# Patient Record
Sex: Male | Born: 1955 | Race: Black or African American | Hispanic: No | Marital: Married | Smoking: Never smoker
Health system: Southern US, Community
[De-identification: ages and names within clinical notes are randomized; demographics above are authoritative.]

## PROBLEM LIST (undated history)

## (undated) DIAGNOSIS — I639 Cerebral infarction, unspecified: Secondary | ICD-10-CM

## (undated) DIAGNOSIS — I10 Essential (primary) hypertension: Secondary | ICD-10-CM

## (undated) HISTORY — PX: NO PAST SURGERIES: SHX2092

---

## 2005-08-22 ENCOUNTER — Encounter: Admission: RE | Admit: 2005-08-22 | Discharge: 2005-08-22 | Payer: Self-pay | Admitting: Internal Medicine

## 2014-11-23 ENCOUNTER — Emergency Department (HOSPITAL_COMMUNITY): Payer: BLUE CROSS/BLUE SHIELD

## 2014-11-23 ENCOUNTER — Inpatient Hospital Stay (HOSPITAL_COMMUNITY)
Admission: EM | Admit: 2014-11-23 | Discharge: 2014-11-24 | DRG: 066 | Disposition: A | Discharge status: Home / Self Care | Payer: BLUE CROSS/BLUE SHIELD | Attending: Internal Medicine | Admitting: Internal Medicine

## 2014-11-23 ENCOUNTER — Encounter (HOSPITAL_COMMUNITY): Payer: Self-pay | Admitting: *Deleted

## 2014-11-23 DIAGNOSIS — I639 Cerebral infarction, unspecified: Principal | ICD-10-CM

## 2014-11-23 DIAGNOSIS — E785 Hyperlipidemia, unspecified: Secondary | ICD-10-CM | POA: Insufficient documentation

## 2014-11-23 DIAGNOSIS — R471 Dysarthria and anarthria: Secondary | ICD-10-CM | POA: Diagnosis present

## 2014-11-23 DIAGNOSIS — I10 Essential (primary) hypertension: Secondary | ICD-10-CM | POA: Diagnosis present

## 2014-11-23 DIAGNOSIS — Z8249 Family history of ischemic heart disease and other diseases of the circulatory system: Secondary | ICD-10-CM

## 2014-11-23 DIAGNOSIS — R29810 Facial weakness: Secondary | ICD-10-CM | POA: Diagnosis present

## 2014-11-23 DIAGNOSIS — I6789 Other cerebrovascular disease: Secondary | ICD-10-CM | POA: Diagnosis not present

## 2014-11-23 DIAGNOSIS — G4733 Obstructive sleep apnea (adult) (pediatric): Secondary | ICD-10-CM | POA: Diagnosis present

## 2014-11-23 DIAGNOSIS — E876 Hypokalemia: Secondary | ICD-10-CM | POA: Diagnosis present

## 2014-11-23 DIAGNOSIS — Z6829 Body mass index (BMI) 29.0-29.9, adult: Secondary | ICD-10-CM

## 2014-11-23 DIAGNOSIS — R4189 Other symptoms and signs involving cognitive functions and awareness: Secondary | ICD-10-CM | POA: Diagnosis present

## 2014-11-23 DIAGNOSIS — Z79899 Other long term (current) drug therapy: Secondary | ICD-10-CM | POA: Diagnosis not present

## 2014-11-23 DIAGNOSIS — E669 Obesity, unspecified: Secondary | ICD-10-CM | POA: Diagnosis present

## 2014-11-23 HISTORY — DX: Essential (primary) hypertension: I10

## 2014-11-23 LAB — PROTIME-INR
INR: 0.97 (ref 0.00–1.49)
PROTHROMBIN TIME: 13.1 s (ref 11.6–15.2)

## 2014-11-23 LAB — DIFFERENTIAL
Basophils Absolute: 0 10*3/uL (ref 0.0–0.1)
Basophils Relative: 0 % (ref 0–1)
EOS PCT: 1 % (ref 0–5)
Eosinophils Absolute: 0.1 10*3/uL (ref 0.0–0.7)
LYMPHS PCT: 17 % (ref 12–46)
Lymphs Abs: 1.5 10*3/uL (ref 0.7–4.0)
MONOS PCT: 7 % (ref 3–12)
Monocytes Absolute: 0.6 10*3/uL (ref 0.1–1.0)
NEUTROS PCT: 75 % (ref 43–77)
Neutro Abs: 6.7 10*3/uL (ref 1.7–7.7)

## 2014-11-23 LAB — COMPREHENSIVE METABOLIC PANEL
ALBUMIN: 4.5 g/dL (ref 3.5–5.0)
ALK PHOS: 50 U/L (ref 38–126)
ALT: 19 U/L (ref 17–63)
ANION GAP: 7 (ref 5–15)
AST: 22 U/L (ref 15–41)
BILIRUBIN TOTAL: 1 mg/dL (ref 0.3–1.2)
BUN: 12 mg/dL (ref 6–20)
CALCIUM: 9.6 mg/dL (ref 8.9–10.3)
CHLORIDE: 103 mmol/L (ref 101–111)
CO2: 28 mmol/L (ref 22–32)
CREATININE: 0.99 mg/dL (ref 0.61–1.24)
GFR calc non Af Amer: >60 mL/min (ref >60)
Glucose, Bld: 114 mg/dL — ABNORMAL HIGH (ref 65–99)
Potassium: 3.2 mmol/L — ABNORMAL LOW (ref 3.5–5.1)
SODIUM: 138 mmol/L (ref 135–145)
TOTAL PROTEIN: 8.1 g/dL (ref 6.5–8.1)

## 2014-11-23 LAB — I-STAT CHEM 8, ED
BUN: 11 mg/dL (ref 6–20)
CALCIUM ION: 1.22 mmol/L (ref 1.12–1.23)
Chloride: 102 mmol/L (ref 101–111)
Creatinine, Ser: 1 mg/dL (ref 0.61–1.24)
GLUCOSE: 109 mg/dL — AB (ref 65–99)
HCT: 47 % (ref 39.0–52.0)
Hemoglobin: 16 g/dL (ref 13.0–17.0)
POTASSIUM: 3.3 mmol/L — AB (ref 3.5–5.1)
Sodium: 140 mmol/L (ref 135–145)
TCO2: 25 mmol/L (ref 0–100)

## 2014-11-23 LAB — CBC
HCT: 43.3 % (ref 39.0–52.0)
HEMOGLOBIN: 14.5 g/dL (ref 13.0–17.0)
MCH: 33.6 pg (ref 26.0–34.0)
MCHC: 33.5 g/dL (ref 30.0–36.0)
MCV: 100.5 fL — ABNORMAL HIGH (ref 78.0–100.0)
PLATELETS: 272 10*3/uL (ref 150–400)
RBC: 4.31 MIL/uL (ref 4.22–5.81)
RDW: 12.2 % (ref 11.5–15.5)
WBC: 8.8 10*3/uL (ref 4.0–10.5)

## 2014-11-23 LAB — I-STAT TROPONIN, ED: TROPONIN I, POC: 0 ng/mL (ref 0.00–0.08)

## 2014-11-23 LAB — APTT: aPTT: 28 seconds (ref 24–37)

## 2014-11-23 MED ORDER — POTASSIUM CHLORIDE CRYS ER 20 MEQ PO TBCR
20.0000 meq | EXTENDED_RELEASE_TABLET | Freq: Once | ORAL | Status: AC
  Administered 2014-11-24: 20 meq via ORAL
  Filled 2014-11-23: qty 1

## 2014-11-23 NOTE — ED Notes (Signed)
Pt complains of left sided facial droop, slurred speech and difficulty finding words since he awoke today at [facility]. Pt states his face was normal when he went to bed today at 3PM. Pt also complains of right arm numbness since Sunday morning.

## 2014-11-23 NOTE — ED Notes (Signed)
Dr. Wentz in room  ?

## 2014-11-23 NOTE — ED Notes (Signed)
Wife states pt woke up Sunday and was c/o numbness in his right arm  States on Tuesday he said a coworker told him he was talking funny and today when he got up about 6pm his speech was slurred and he had some facial droop on the right side

## 2014-11-23 NOTE — H&P (Signed)
[facility] History and Physical  Phillip Curry SAY:[id number] DOB: [birth year] DOA: 11/23/2014  Referring physician: Dr. Eulis Foster. PCP: No primary care provider on file. Dr. Mertha Finders. Specialists: None.  Chief Complaint: Right-sided facial droop and right-sided numbness.  HPI: Phillip Curry Current is a 59 y.o. male with history of hypertension who was brought to the ER to patient's wife noticed patient having right facial droop. Patient states he was experiencing right upper extremity numbness for the last 3 days. 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 This evening around 6 PM his wife noticed right facial droop and patient was brought to the ER. MRI of the brain shows acute nonhemorrhagic stroke. On-call neurologist Dr. Doy Mince was consulted and patient has been admitted for further stroke management. Patient was out of the window period for TPA. Patient on exam is able to move all extremities and has right facial droop and slurred speech. Denies any chest pain shortness of breath headache visual symptoms difficulty swallowing or any visual symptoms.   Review of Systems: As presented in the history of presenting illness, rest negative.  Past Medical History  Diagnosis Date  . Hypertension    Past Surgical History  Procedure Laterality Date  . No past surgeries     Social History:  reports that he has never smoked. He does not have any smokeless tobacco history on file. He reports that he drinks alcohol. His drug history is not on file. Where does patient live at home. Can patient participate in ADLs? Yes.  No Known Allergies  Family History:  Family History  Problem Relation Age of Onset  . Hypertension Mother       Prior to Admission medications   Medication Sig Start Date End Date Taking? Authorizing Provider  Multiple Vitamin (MULTIVITAMIN WITH MINERALS) TABS tablet Take 1 tablet by mouth daily.   Yes Historical Provider, MD   valsartan-hydrochlorothiazide (DIOVAN-HCT) 160-12.5 MG per tablet Take 1 tablet by mouth daily. 11/09/14  Yes Historical Provider, MD    Physical Exam: Filed Vitals:   11/23/14 2153 11/23/14 2200 11/23/14 2230 11/23/14 2300  BP: 154/91 149/88 152/80 153/84  Pulse: 67 68 67 66  Temp: 98.1 F (36.7 C)     TempSrc: Oral     Resp: 17 18 22 13   SpO2: 97% 96% 95% 97%     General:  Moderately built and nourished.  Eyes: Anicteric no pallor.  ENT: No discharge from the ears eyes nose and mouth.  Neck: No mass felt. No neck rigidity.  Cardiovascular: S1-S2 heard.  Respiratory: No rhonchi or crepitations.  Abdomen: Soft nontender bowel sounds present.  Skin: No rash.  Musculoskeletal: No edema.  Psychiatric: Appears normal.  Neurologic: Alert awake oriented to time place and person. Moves all extremities 5 x 5. Right facial droop. Tongue is midline. PERRLA positive.  Labs on Admission:  Basic Metabolic Panel:  Recent Labs Lab 11/23/14 2032 11/23/14 2042  NA 138 140  K 3.2* 3.3*  CL 103 102  CO2 28  --   GLUCOSE 114* 109*  BUN 12 11  CREATININE 0.99 1.00  CALCIUM 9.6  --    Liver Function Tests:  Recent Labs Lab 11/23/14 2032  AST 22  ALT 19  ALKPHOS 50  BILITOT 1.0  PROT 8.1  ALBUMIN 4.5   No results for input(s): LIPASE, AMYLASE in the last 168 hours. No results for input(s): AMMONIA in the last 168 hours. CBC:  Recent Labs Lab 11/23/14 2032  11/23/14 2042  WBC 8.8  --   NEUTROABS 6.7  --   HGB 14.5 16.0  HCT 43.3 47.0  MCV 100.5*  --   PLT 272  --    Cardiac Enzymes: No results for input(s): CKTOTAL, CKMB, CKMBINDEX, TROPONINI in the last 168 hours.  BNP (last 3 results) No results for input(s): BNP in the last 8760 hours.  ProBNP (last 3 results) No results for input(s): PROBNP in the last 8760 hours.  CBG: No results for input(s): GLUCAP in the last 168 hours.  Radiological Exams on Admission: Ct Head Wo Contrast  11/23/2014    CLINICAL DATA:  Left-sided facial droop and slurred speech.  EXAM: CT HEAD WITHOUT CONTRAST  TECHNIQUE: Contiguous axial images were obtained from the base of the skull through the vertex without intravenous contrast.  COMPARISON:  None.  FINDINGS: There is patchy low attenuation within the left basal ganglia. No evidence for acute intracranial hemorrhage or significant mass effect. Ventricles and sulci are appropriate for patient's age. Orbits are unremarkable. Paranasal sinuses are unremarkable. Calvarium is intact.  IMPRESSION: Findings compatible with acute infarct within left basal ganglia region. No intracranial hemorrhage.  These results were called by telephone at the time of interpretation on 11/23/2014 at 9:19 pm to Dr. Daleen Bo , who verbally acknowledged these results.   Electronically Signed   By: Lovey Newcomer M.D.   On: 11/23/2014 21:21   [name] Head Wo Contrast  11/23/2014   CLINICAL DATA:  Left-sided facial droop. Slurred speech difficulty finding words. Right arm numbness for 3 days.  EXAM: MRI HEAD WITHOUT CONTRAST  MRA HEAD WITHOUT CONTRAST  TECHNIQUE: Multiplanar, multiecho pulse sequences of the brain and surrounding structures were obtained without intravenous contrast. Angiographic images of the head were obtained using MRA technique without contrast.  COMPARISON:  CT head from the same day.  FINDINGS: MRI HEAD FINDINGS  The diffusion-weighted images confirm acute nonhemorrhagic infarcts involving the posterior left lentiform nucleus and left centrum semi ovale. No acute hemorrhage or mass lesion is present. T2 changes are associated with the acute infarct in the posterior left lentiform nucleus. There is a remote lacunar infarct in the left thalamus. Scattered subcortical T2 changes bilaterally are advanced for age.  Flow is present in the major intracranial arteries. The globes and orbits are intact. Mild mucosal thickening is present in the ethmoid air cells and left frontal sinus. The  remaining paranasal sinuses and the mastoid air cells are clear.  Skullbase is within normal limits. Midline structures are otherwise unremarkable.  MRA HEAD FINDINGS  The internal carotid arteries are within normal limits from the high cervical segments through the ICA terminus bilaterally. The left A1 segment is hypoplastic. There is asymmetric attenuation of anterior left MCA branch vessels. The more posterior branch vessels are intact. Right MCA branch vessels are intact. The anterior communicating artery is patent.  The left vertebral artery is slightly dominant to the right. The PICA origins are visualized and normal. The basilar artery is normal. Both posterior cerebral arteries originate from the basilar tip. The PCA branch vessels are intact.  IMPRESSION: 1. 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 2. Age advanced subcortical white matter changes bilaterally. These likely reflect the sequela of chronic microvascular ischemia. 3. Asymmetric attenuation of anterior left in 3 branch vessels. No significant proximal stenosis or occlusion is present. These results were called by telephone at the time of interpretation on 11/23/2014 at 9:41 pm  to Dr. Daleen Bo , who verbally acknowledged these results.   Electronically Signed   By: San Morelle M.D.   On: 11/23/2014 21:41   Mr Brain Wo Contrast  11/23/2014   CLINICAL DATA:  Left-sided facial droop. Slurred speech difficulty finding words. Right arm numbness for 3 days.  EXAM: MRI HEAD WITHOUT CONTRAST  MRA HEAD WITHOUT CONTRAST  TECHNIQUE: Multiplanar, multiecho pulse sequences of the brain and surrounding structures were obtained without intravenous contrast. Angiographic images of the head were obtained using MRA technique without contrast.  COMPARISON:  CT head from the same day.  FINDINGS: MRI HEAD FINDINGS  The diffusion-weighted images confirm acute nonhemorrhagic infarcts involving the  posterior left lentiform nucleus and left centrum semi ovale. No acute hemorrhage or mass lesion is present. T2 changes are associated with the acute infarct in the posterior left lentiform nucleus. There is a remote lacunar infarct in the left thalamus. Scattered subcortical T2 changes bilaterally are advanced for age.  Flow is present in the major intracranial arteries. The globes and orbits are intact. Mild mucosal thickening is present in the ethmoid air cells and left frontal sinus. The remaining paranasal sinuses and the mastoid air cells are clear.  Skullbase is within normal limits. Midline structures are otherwise unremarkable.  MRA HEAD FINDINGS  The internal carotid arteries are within normal limits from the high cervical segments through the ICA terminus bilaterally. The left A1 segment is hypoplastic. There is asymmetric attenuation of anterior left MCA branch vessels. The more posterior branch vessels are intact. Right MCA branch vessels are intact. The anterior communicating artery is patent.  The left vertebral artery is slightly dominant to the right. The PICA origins are visualized and normal. The basilar artery is normal. Both posterior cerebral arteries originate from the basilar tip. The PCA branch vessels are intact.  IMPRESSION: 1. 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 2. Age advanced subcortical white matter changes bilaterally. These likely reflect the sequela of chronic microvascular ischemia. 3. Asymmetric attenuation of anterior left in 3 branch vessels. No significant proximal stenosis or occlusion is present. These results were called by telephone at the time of interpretation on 11/23/2014 at 9:41 pm to Dr. Daleen Bo , who verbally acknowledged these results.   Electronically Signed   By: San Morelle M.D.   On: 11/23/2014 21:41    EKG: Independently reviewed. Normal sinus rhythm.  Assessment/Plan Principal  Problem:   Stroke Active Problems:   Hypertension   1. Stroke - patient has passed stroke swallow. Patient will be placed on neuro checks. Get 2-D echo and carotid Doppler. Aspirin. Posterior monitor in telemetry. Neurology to see patient in consult. 2. Hypertension - continue Diovan. Hold HCTZ and patient will be placed on gentle hydration. Allow for permissive hypertension. 3. Mild hypokalemia - probably from HCTZ. Replace and recheck.  Chest x-ray is pending. I have personally reviewed the patient's EKG.  Patient will be transferred to [facility]. Patient is agreeable to transfer. Dr. Posey Pronto will be the accepting physician.   DVT Prophylaxis Lovenox.  Code Status: Full code.  Family Communication: Discussed with patient's family.  Disposition Plan: Admit to inpatient.    Wynter Grave N. [facility] Pager [phone].  If 7PM-7AM, please contact night-coverage [email or web address] Password [email or web address] 11/23/2014, 11:29 PM

## 2014-11-23 NOTE — ED Notes (Signed)
Patient transported to MRI 

## 2014-11-23 NOTE — ED Provider Notes (Signed)
CSN: [id number]     Arrival date & time 11/23/14  2000 History   First MD Initiated Contact with Patient 11/23/14 2035     Chief Complaint  Patient presents with  . Facial Droop  . Numbness     (Consider location/radiation/quality/duration/timing/severity/associated sxs/prior Treatment) HPI   Calel Pisarski is a 59 y.o. male who presents for evaluation of slurred speech, facial asymmetry, and tingling in his right arm. Arm tingling and slurred speech had been present for 3 days, then his wife noticed facial asymmetry, today. He was able to work the last several days. He denies dizziness, weakness, nausea, vomiting, headache, chest pain or back pain. He's never had this previously. He has no other medical concerns or problems. There are no other known modifying factors.   History reviewed. No pertinent past medical history. History reviewed. No pertinent past surgical history. No family history on file. History  Substance Use Topics  . Smoking status: Never Smoker   . Smokeless tobacco: Not on file  . Alcohol Use: Yes    Review of Systems  All other systems reviewed and are negative.     Allergies  Review of patient's allergies indicates no known allergies.  Home Medications   Prior to Admission medications   Medication Sig Start Date End Date Taking? Authorizing Provider  Multiple Vitamin (MULTIVITAMIN WITH MINERALS) TABS tablet Take 1 tablet by mouth daily.   Yes Historical Provider, MD  valsartan-hydrochlorothiazide (DIOVAN-HCT) 160-12.5 MG per tablet Take 1 tablet by mouth daily. 11/09/14  Yes Historical Provider, MD   BP 154/91 mmHg  Pulse 67  Temp(Src) 98.1 F (36.7 C) (Oral)  Resp 17  SpO2 97% Physical Exam  Constitutional: He is oriented to person, place, and time. He appears well-developed and well-nourished.  HENT:  Head: Normocephalic and atraumatic.  Right Ear: External ear normal.  Left Ear: External ear normal.  Eyes: Conjunctivae and EOM are normal.  Pupils are equal, round, and reactive to light.  Neck: Normal range of motion and phonation normal. Neck supple.  Cardiovascular: Normal rate, regular rhythm and normal heart sounds.   Pulmonary/Chest: Effort normal and breath sounds normal. He exhibits no bony tenderness.  Abdominal: Soft. There is no tenderness.  Musculoskeletal: Normal range of motion.  Neurological: He is alert and oriented to person, place, and time. No cranial nerve deficit or sensory deficit. He exhibits normal muscle tone. Coordination normal.  Mild dysarthria. No aphasia or nystagmus. Normal finger-to-nose and heel-to-shin, bilaterally. Mild facial asymmetry, right-sided droop.  Skin: Skin is warm, dry and intact.  Psychiatric: He has a normal mood and affect. His behavior is normal. Judgment and thought content normal.  Nursing note and vitals reviewed.   ED Course  Procedures (including critical care time)  Medications - No data to display  Patient Vitals for the past 24 hrs:  BP Temp Temp src Pulse Resp SpO2  11/23/14 2153 154/91 mmHg 98.1 F (36.7 C) Oral 67 17 97 %  11/23/14 2048 - 98 F (36.7 C) - - - -  11/23/14 2045 171/96 mmHg 98 F (36.7 C) Oral 78 23 100 %  11/23/14 2007 154/84 mmHg 98.6 F (37 C) Oral 79 18 (!) 83 %   Findings discussed with on-call neuro hospitalist,- . She will see the patient in consultation at [place]  10:01 PM-Consult complete with Hospitalist. Patient case explained and discussed. He agrees to admit patient for further evaluation and treatment. Call ended at 2215  10:25 PM Reevaluation with update and  discussion. After initial assessment and treatment, an updated evaluation reveals no change in clinical status.Daleen Bo L    Labs Review Labs Reviewed  CBC - Abnormal; Notable for the following:    MCV 100.5 (*)    All other components within normal limits  COMPREHENSIVE METABOLIC PANEL - Abnormal; Notable for the following:    Potassium 3.2 (*)     Glucose, Bld 114 (*)    All other components within normal limits  I-STAT CHEM 8, ED - Abnormal; Notable for the following:    Potassium 3.3 (*)    Glucose, Bld 109 (*)    All other components within normal limits  PROTIME-INR  APTT  DIFFERENTIAL  I-STAT TROPOININ, ED  CBG MONITORING, ED    Imaging Review Ct Head Wo Contrast  11/23/2014   CLINICAL DATA:  Left-sided facial droop and slurred speech.  EXAM: CT HEAD WITHOUT CONTRAST  TECHNIQUE: Contiguous axial images were obtained from the base of the skull through the vertex without intravenous contrast.  COMPARISON:  None.  FINDINGS: There is patchy low attenuation within the left basal ganglia. No evidence for acute intracranial hemorrhage or significant mass effect. Ventricles and sulci are appropriate for patient's age. Orbits are unremarkable. Paranasal sinuses are unremarkable. Calvarium is intact.  IMPRESSION: Findings compatible with acute infarct within left basal ganglia region. No intracranial hemorrhage.  These results were called by telephone at the time of interpretation on 11/23/2014 at 9:19 pm to Dr. Daleen Bo , who verbally acknowledged these results.   Electronically Signed   By: Lovey Newcomer M.D.   On: 11/23/2014 21:21   [name] Head Wo Contrast  11/23/2014   CLINICAL DATA:  Left-sided facial droop. Slurred speech difficulty finding words. Right arm numbness for 3 days.  EXAM: MRI HEAD WITHOUT CONTRAST  MRA HEAD WITHOUT CONTRAST  TECHNIQUE: Multiplanar, multiecho pulse sequences of the brain and surrounding structures were obtained without intravenous contrast. Angiographic images of the head were obtained using MRA technique without contrast.  COMPARISON:  CT head from the same day.  FINDINGS: MRI HEAD FINDINGS  The diffusion-weighted images confirm acute nonhemorrhagic infarcts involving the posterior left lentiform nucleus and left centrum semi ovale. No acute hemorrhage or mass lesion is present. T2 changes are associated with  the acute infarct in the posterior left lentiform nucleus. There is a remote lacunar infarct in the left thalamus. Scattered subcortical T2 changes bilaterally are advanced for age.  Flow is present in the major intracranial arteries. The globes and orbits are intact. Mild mucosal thickening is present in the ethmoid air cells and left frontal sinus. The remaining paranasal sinuses and the mastoid air cells are clear.  Skullbase is within normal limits. Midline structures are otherwise unremarkable.  MRA HEAD FINDINGS  The internal carotid arteries are within normal limits from the high cervical segments through the ICA terminus bilaterally. The left A1 segment is hypoplastic. There is asymmetric attenuation of anterior left MCA branch vessels. The more posterior branch vessels are intact. Right MCA branch vessels are intact. The anterior communicating artery is patent.  The left vertebral artery is slightly dominant to the right. The PICA origins are visualized and normal. The basilar artery is normal. Both posterior cerebral arteries originate from the basilar tip. The PCA branch vessels are intact.  IMPRESSION: 1. 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 2. Age advanced subcortical white matter changes bilaterally. These likely reflect the sequela of chronic microvascular ischemia. 3.  Asymmetric attenuation of anterior left in 3 branch vessels. No significant proximal stenosis or occlusion is present. These results were called by telephone at the time of interpretation on 11/23/2014 at 9:41 pm to Dr. Daleen Bo , who verbally acknowledged these results.   Electronically Signed   By: San Morelle M.D.   On: 11/23/2014 21:41   Mr Brain Wo Contrast  11/23/2014   CLINICAL DATA:  Left-sided facial droop. Slurred speech difficulty finding words. Right arm numbness for 3 days.  EXAM: MRI HEAD WITHOUT CONTRAST  MRA HEAD WITHOUT CONTRAST  TECHNIQUE:  Multiplanar, multiecho pulse sequences of the brain and surrounding structures were obtained without intravenous contrast. Angiographic images of the head were obtained using MRA technique without contrast.  COMPARISON:  CT head from the same day.  FINDINGS: MRI HEAD FINDINGS  The diffusion-weighted images confirm acute nonhemorrhagic infarcts involving the posterior left lentiform nucleus and left centrum semi ovale. No acute hemorrhage or mass lesion is present. T2 changes are associated with the acute infarct in the posterior left lentiform nucleus. There is a remote lacunar infarct in the left thalamus. Scattered subcortical T2 changes bilaterally are advanced for age.  Flow is present in the major intracranial arteries. The globes and orbits are intact. Mild mucosal thickening is present in the ethmoid air cells and left frontal sinus. The remaining paranasal sinuses and the mastoid air cells are clear.  Skullbase is within normal limits. Midline structures are otherwise unremarkable.  MRA HEAD FINDINGS  The internal carotid arteries are within normal limits from the high cervical segments through the ICA terminus bilaterally. The left A1 segment is hypoplastic. There is asymmetric attenuation of anterior left MCA branch vessels. The more posterior branch vessels are intact. Right MCA branch vessels are intact. The anterior communicating artery is patent.  The left vertebral artery is slightly dominant to the right. The PICA origins are visualized and normal. The basilar artery is normal. Both posterior cerebral arteries originate from the basilar tip. The PCA branch vessels are intact.  IMPRESSION: 1. 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 2. Age advanced subcortical white matter changes bilaterally. These likely reflect the sequela of chronic microvascular ischemia. 3. Asymmetric attenuation of anterior left in 3 branch vessels. No significant  proximal stenosis or occlusion is present. These results were called by telephone at the time of interpretation on 11/23/2014 at 9:41 pm to Dr. Daleen Bo , who verbally acknowledged these results.   Electronically Signed   By: San Morelle M.D.   On: 11/23/2014 21:41     EKG Interpretation   Date/Time:  Wednesday November 23 2014 20:07:04 EDT Ventricular Rate:  77 PR Interval:  157 QRS Duration: 94 QT Interval:  393 QTC Calculation: 445 R Axis:   75 Text Interpretation:  Sinus rhythm RSR' in V1 or V2, right VCD or RVH No  old tracing to compare Confirmed by [facility]  MD, Syon Tews [phone]) on 11/23/2014  9:55:00 PM      MDM   Final diagnoses:  CVA (cerebral infarction)     Subacute CVA, not meeting criteria for thrombolysis. Symptoms are relatively mild, present for 3 days. Possible worsening today with newly noticed facial droop. This is likely secondary to edema. Doubt ACS, metabolic instability or impending vascular collapse.  Nursing Notes Reviewed/ Care Coordinated Applicable Imaging Reviewed Interpretation of Laboratory Data incorporated into ED treatment  Plan: admit   Daleen Bo, MD 11/23/14 2232

## 2014-11-23 NOTE — ED Notes (Signed)
Patient transported to CT 

## 2014-11-23 NOTE — ED Notes (Signed)
Introduced self to pt and significant other, pt a&ox4, denies pain or further need. Aware waiting for [facility] for transfer to [facility].

## 2014-11-24 ENCOUNTER — Ambulatory Visit (HOSPITAL_COMMUNITY): Payer: BLUE CROSS/BLUE SHIELD

## 2014-11-24 ENCOUNTER — Inpatient Hospital Stay (HOSPITAL_COMMUNITY): Payer: BLUE CROSS/BLUE SHIELD

## 2014-11-24 ENCOUNTER — Other Ambulatory Visit: Payer: Self-pay | Admitting: Neurology

## 2014-11-24 DIAGNOSIS — I639 Cerebral infarction, unspecified: Principal | ICD-10-CM

## 2014-11-24 DIAGNOSIS — I63312 Cerebral infarction due to thrombosis of left middle cerebral artery: Secondary | ICD-10-CM

## 2014-11-24 DIAGNOSIS — I6789 Other cerebrovascular disease: Secondary | ICD-10-CM

## 2014-11-24 DIAGNOSIS — E785 Hyperlipidemia, unspecified: Secondary | ICD-10-CM | POA: Insufficient documentation

## 2014-11-24 LAB — LIPID PANEL
Cholesterol: 218 mg/dL — ABNORMAL HIGH (ref 0–200)
HDL: 62 mg/dL (ref >40)
LDL CALC: 145 mg/dL — AB (ref 0–99)
Total CHOL/HDL Ratio: 3.5 RATIO
Triglycerides: 57 mg/dL (ref <150)
VLDL: 11 mg/dL (ref 0–40)

## 2014-11-24 LAB — CBC WITH DIFFERENTIAL/PLATELET
Basophils Absolute: 0 10*3/uL (ref 0.0–0.1)
Basophils Relative: 0 % (ref 0–1)
Eosinophils Absolute: 0.1 10*3/uL (ref 0.0–0.7)
Eosinophils Relative: 1 % (ref 0–5)
HCT: 39.1 % (ref 39.0–52.0)
Hemoglobin: 12.9 g/dL — ABNORMAL LOW (ref 13.0–17.0)
LYMPHS PCT: 25 % (ref 12–46)
Lymphs Abs: 1.6 10*3/uL (ref 0.7–4.0)
MCH: 32.9 pg (ref 26.0–34.0)
MCHC: 33 g/dL (ref 30.0–36.0)
MCV: 99.7 fL (ref 78.0–100.0)
Monocytes Absolute: 0.5 10*3/uL (ref 0.1–1.0)
Monocytes Relative: 8 % (ref 3–12)
NEUTROS ABS: 4.1 10*3/uL (ref 1.7–7.7)
Neutrophils Relative %: 66 % (ref 43–77)
Platelets: 243 10*3/uL (ref 150–400)
RBC: 3.92 MIL/uL — ABNORMAL LOW (ref 4.22–5.81)
RDW: 12.4 % (ref 11.5–15.5)
WBC: 6.3 10*3/uL (ref 4.0–10.5)

## 2014-11-24 LAB — COMPREHENSIVE METABOLIC PANEL
ALT: 17 U/L (ref 17–63)
ANION GAP: 5 (ref 5–15)
AST: 20 U/L (ref 15–41)
Albumin: 3.6 g/dL (ref 3.5–5.0)
Alkaline Phosphatase: 42 U/L (ref 38–126)
BILIRUBIN TOTAL: 0.8 mg/dL (ref 0.3–1.2)
BUN: 7 mg/dL (ref 6–20)
CO2: 29 mmol/L (ref 22–32)
CREATININE: 0.97 mg/dL (ref 0.61–1.24)
Calcium: 9.3 mg/dL (ref 8.9–10.3)
Chloride: 106 mmol/L (ref 101–111)
GFR calc Af Amer: >60 mL/min (ref >60)
GFR calc non Af Amer: >60 mL/min (ref >60)
Glucose, Bld: 104 mg/dL — ABNORMAL HIGH (ref 65–99)
POTASSIUM: 4.2 mmol/L (ref 3.5–5.1)
Sodium: 140 mmol/L (ref 135–145)
Total Protein: 6.8 g/dL (ref 6.5–8.1)

## 2014-11-24 MED ORDER — SENNOSIDES-DOCUSATE SODIUM 8.6-50 MG PO TABS
1.0000 | ORAL_TABLET | Freq: Every evening | ORAL | Status: DC | PRN
Start: 2014-11-24 — End: 2014-11-24
  Administered 2014-11-24: 1 via ORAL
  Filled 2014-11-24: qty 1

## 2014-11-24 MED ORDER — ASPIRIN 325 MG PO TABS: 325.0000 mg | ORAL_TABLET | Freq: Every day | ORAL | Status: AC

## 2014-11-24 MED ORDER — SODIUM CHLORIDE 0.9 % IV SOLN
INTRAVENOUS | Status: DC
  Administered 2014-11-24: 01:00:00 via INTRAVENOUS

## 2014-11-24 MED ORDER — SODIUM CHLORIDE 0.9 % IV SOLN: INTRAVENOUS | Status: DC

## 2014-11-24 MED ORDER — ATORVASTATIN CALCIUM 40 MG PO TABS
40.0000 mg | ORAL_TABLET | Freq: Every day | ORAL | Status: DC
  Administered 2014-11-24: 40 mg via ORAL
  Filled 2014-11-24: qty 1

## 2014-11-24 MED ORDER — ATORVASTATIN CALCIUM 40 MG PO TABS: 40.0000 mg | ORAL_TABLET | Freq: Every day | ORAL | Status: AC

## 2014-11-24 MED ORDER — ENOXAPARIN SODIUM 40 MG/0.4ML ~~LOC~~ SOLN
40.0000 mg | SUBCUTANEOUS | Status: DC
  Administered 2014-11-24: 40 mg via SUBCUTANEOUS
  Filled 2014-11-24: qty 0.4

## 2014-11-24 MED ORDER — IRBESARTAN 150 MG PO TABS
150.0000 mg | ORAL_TABLET | Freq: Every day | ORAL | Status: DC
  Administered 2014-11-24: 150 mg via ORAL
  Filled 2014-11-24: qty 1

## 2014-11-24 MED ORDER — ASPIRIN 300 MG RE SUPP: 300.0000 mg | Freq: Every day | RECTAL | Status: DC

## 2014-11-24 MED ORDER — ASPIRIN 325 MG PO TABS
325.0000 mg | ORAL_TABLET | Freq: Every day | ORAL | Status: DC
  Administered 2014-11-24: 325 mg via ORAL
  Filled 2014-11-24: qty 1

## 2014-11-24 MED ORDER — STROKE: EARLY STAGES OF RECOVERY BOOK
Freq: Once | Status: AC
  Administered 2014-11-24: 1

## 2014-11-24 NOTE — Progress Notes (Signed)
STROKE TEAM PROGRESS NOTE   HISTORY Phillip Curry is a 59 y.o. RH male who reports that on Sunday he awakened for work (works 3rd shift) and noted that his right arm was numb. He proceeded to work and his symptoms resolved. He did well Monday and on Tuesday on awakening his right arm numbness had returned. At work his coworkers noted that he was not talking as usual also. The patient was in no other way affected at work. Today on awakening his wife noted that he had a facial droop and prompted the patient to come to the ED for further evaluation. Numbness has now resolved.   Date last known well: Date: 11/23/2014 Time last known well: Time: 15:00 tPA Given: No: Outside time window   SUBJECTIVE (INTERVAL HISTORY) No family members present. The patient still has residual weakness of the right upper extremity and a mild facial droop.   OBJECTIVE Temp:  [97.8 F (36.6 C)-98.9 F (37.2 C)] 98.4 F (36.9 C) (08/04 1318) Pulse Rate:  [66-79] 69 (08/04 1318) Cardiac Rhythm:  [-] Normal sinus rhythm (08/04 0051) Resp:  [13-23] 20 (08/04 1318) BP: (137-171)/(79-96) 150/83 mmHg (08/04 1318) SpO2:  [83 %-100 %] 100 % (08/04 1318) Weight:  [92.443 kg (203 lb 12.8 oz)] 92.443 kg (203 lb 12.8 oz) (08/04 0051)  No results for input(s): GLUCAP in the last 168 hours.  Recent Labs Lab 11/23/14 2032 11/23/14 2042 11/24/14 0450  NA 138 140 140  K 3.2* 3.3* 4.2  CL 103 102 106  CO2 28  --  29  GLUCOSE 114* 109* 104*  BUN 12 11 7   CREATININE 0.99 1.00 0.97  CALCIUM 9.6  --  9.3    Recent Labs Lab 11/23/14 2032 11/24/14 0450  AST 22 20  ALT 19 17  ALKPHOS 50 42  BILITOT 1.0 0.8  PROT 8.1 6.8  ALBUMIN 4.5 3.6    Recent Labs Lab 11/23/14 2032 11/23/14 2042 11/24/14 0450  WBC 8.8  --  6.3  NEUTROABS 6.7  --  4.1  HGB 14.5 16.0 12.9*  HCT 43.3 47.0 39.1  MCV 100.5*  --  99.7  PLT 272  --  243   No results for input(s): CKTOTAL, CKMB, CKMBINDEX, TROPONINI in the last 168  hours.  Recent Labs  11/23/14 2032  LABPROT 13.1  INR 0.97   No results for input(s): COLORURINE, LABSPEC, PHURINE, GLUCOSEU, HGBUR, BILIRUBINUR, KETONESUR, PROTEINUR, UROBILINOGEN, NITRITE, LEUKOCYTESUR in the last 72 hours.  Invalid input(s): APPERANCEUR     Component Value Date/Time   CHOL 218* 11/24/2014 0450   TRIG 57 11/24/2014 0450   HDL 62 11/24/2014 0450   CHOLHDL 3.5 11/24/2014 0450   VLDL 11 11/24/2014 0450   LDLCALC 145* 11/24/2014 0450   No results found for: HGBA1C No results found for: LABOPIA, COCAINSCRNUR, LABBENZ, AMPHETMU, THCU, LABBARB  No results for input(s): ETH in the last 168 hours.   Imaging  Ct Head Wo Contrast 11/23/2014    Findings compatible with acute infarct within left basal ganglia region. No intracranial hemorrhage.    [name] Head Wo Contrast 11/23/2014    1. 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  2. Age advanced subcortical white matter changes bilaterally. These likely reflect the sequela of chronic microvascular ischemia.  3. Asymmetric attenuation of anterior left in 3 branch vessels. No significant proximal stenosis or occlusion is present.   CUS - Bilateral: 1-39% ICA stenosis. Vertebral artery flow is antegrade.  2D echo - - Left ventricle: The cavity size was normal. Systolic function was normal. The estimated ejection fraction was in the range of 55% to 60%. Wall motion was normal; there were no regional wall motion abnormalities. Left ventricular diastolic function parameters were normal.   PHYSICAL EXAM  Temp:  [97.8 F (36.6 C)-98.9 F (37.2 C)] 98.4 F (36.9 C) (08/04 1318) Pulse Rate:  [66-72] 69 (08/04 1318) Resp:  [13-22] 20 (08/04 1318) BP: (137-163)/(79-92) 150/83 mmHg (08/04 1318) SpO2:  [95 %-100 %] 100 % (08/04 1318) Weight:  [203 lb 12.8 oz (92.443 kg)] 203 lb 12.8 oz (92.443 kg) (08/04 0051)  General - Well nourished, well developed, in  no apparent distress.  Ophthalmologic - Fundi not visualized due to small pupils.  Cardiovascular - Regular rate and rhythm with no murmur.  Mental Status -  Level of arousal and orientation to time, place, and person were intact. Language including expression, naming, repetition, comprehension was assessed and found intact. Fund of Knowledge was assessed and was intact.  Cranial Nerves II - XII - II - Visual field intact OU. III, IV, VI - Extraocular movements intact. V - Facial sensation intact bilaterally. VII - right facial droop. VIII - Hearing & vestibular intact bilaterally. X - Palate elevates symmetrically, dysarthria. XI - Chin turning & shoulder shrug intact bilaterally. XII - Tongue protrusion intact.  Motor Strength - The patient's strength was normal in all extremities except right hand grip 4/5 and dexterity difficulty and pronator drift was present on the right.  Bulk was normal and fasciculations were absent.   Motor Tone - Muscle tone was assessed at the neck and appendages and was normal.  Reflexes - The patient's reflexes were 1+ in all extremities and he had no pathological reflexes.  Sensory - Light touch, temperature/pinprick were assessed and were symmetrical.    Coordination - The patient had normal movements in the hands and feet with no ataxia or dysmetria.  Tremor was absent.  Gait and Station - deferred due to safety concerns.  ASSESSMENT/PLAN Phillip Curry is a 59 y.o. male with history of hypertension presenting with right arm weakness, speech difficulties, and facial droop. He did not receive IV t-PA due to late presentation.  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  Resultant  mild right upper extremity weakness and mild right facial droop  MRI   Acute small infarct involving the posterior left lentiform nucleus and centrum semi ovale.   MRA  Asymmetric attenuation of anterior left in 3 branch vessels. No  significant proximal stenosis or occlusion is present.   Carotid Doppler unremarkable  2D Echo  EF 55-60%  LDL 145  HgbA1c pending  UDS - pending  Lovenox for VTE prophylaxis Diet Heart Room service appropriate?: Yes; Fluid consistency:: Thin  no antithrombotic prior to admission, now on aspirin 325 mg orally every day  Patient counseled to be compliant with his antithrombotic medications  Ongoing aggressive stroke risk factor management  Therapy recommendations: No follow-up physical therapy.  Disposition: Pending  Hypertension  Home meds: Valsartan/hydrochlorothiazide  Stable Permissive hypertension <220/120 for 24-48 hours and then gradually normalize within 5-7 days Patient counseled to be compliant with his blood pressure medications  Hyperlipidemia  Home meds: No lipid lowering medications prior to admission  LDL 145, goal < 70  Add Lipitor 40 mg daily  Continue statin at discharge  Other Stroke Risk Factors  Obesity, Body mass index is 29.24 kg/(m^2).  ETOH - occasional use  Other Active Problems  Hypokalemia - corrected  PLAN  Needs sleep study to rule out obstructive sleep apnea  Hospital day # 1  Rosalin Hawking, MD PhD Stroke Neurology 11/24/2014 10:04 PM     To contact Stroke Continuity provider, please refer to [email or web address] After hours, contact General Neurology

## 2014-11-24 NOTE — Progress Notes (Signed)
Pt arrived from [facility] to 4N07. Pt alert and oriented. Denies any pain. Instructions provided on safety measures with verbalized understanding.  Call light within reach. Will continue to monitor.  Docia Barrier, RN

## 2014-11-24 NOTE — Progress Notes (Signed)
Utilization review completed.  

## 2014-11-24 NOTE — Progress Notes (Signed)
*  PRELIMINARY RESULTS* Echocardiogram 2D Echocardiogram has been performed.  Leavy Cella 11/24/2014, 12:30 PM

## 2014-11-24 NOTE — Progress Notes (Signed)
*  PRELIMINARY RESULTS* Vascular Ultrasound Carotid Duplex (Doppler) has been completed.  Preliminary findings: Bilateral:  1-39% ICA stenosis.  Vertebral artery flow is antegrade.      Landry Mellow, RDMS, RVT  11/24/2014, 3:47 PM

## 2014-11-24 NOTE — Evaluation (Signed)
Speech Language Pathology Evaluation Patient Details Name: Phillip Curry MRN: [id number] DOB: August 19, 1955 Today's Date: 11/24/2014 Time: 1350-1430 SLP Time Calculation (min) (ACUTE ONLY): 40 min  Problem List:  Patient Active Problem List   Diagnosis Date Noted  . CVA (cerebral infarction) 11/23/2014  . Stroke 11/23/2014  . Hypertension 11/23/2014   Past Medical History:  Past Medical History  Diagnosis Date  . Hypertension    Past Surgical History:  Past Surgical History  Procedure Laterality Date  . No past surgeries     HPI:  59 y.o. RH male who reports that on Sunday he awakened for work (works 3rd shift) and noted that his right arm was numb. Imaging revealed nonhemorrhagic infarct involving the posterior left lentiform nucleus and centrum semi ovale.   Assessment / Plan / Recommendation Clinical Impression  Patient demonstrates mild higher-level cognitive impairments in the areas of immediate and short-term recall as well as problem solving and organization. Patient was administered the MoCA and scored 14/22 points with a score of 18 or above considered normal. Patient also required extra time and supervision verbal cues and repetition for functional problem solving and recall with a basic money management task. Patient demonstrates mild dysarthria characterized by imprecise consonants that decreased his overall speech intelligibility, however, intelligibility increased with utilization of a decreased speech rate and over-articulation. Patient with dysfluencies throughout functional conversation, however, patient reports this is baseline (no family present to confirm).  Recommend outpatient f/u SLP services to maximize cognitive function and speech intelligibility in order to maximize his overall functional independence.     SLP Assessment  All further Speech Lanaguage Pathology  needs can be addressed in the next venue of care    Follow Up Recommendations  Outpatient SLP     Frequency and Duration N/A    Pertinent Vitals/Pain Pain Assessment: No/denies pain   SLP Goals     SLP Evaluation Prior Functioning  Cognitive/Linguistic Baseline: Within functional limits Type of Home: House  Lives With: Spouse Available Help at Discharge: Family;Available PRN/intermittently Vocation: Full time employment   Cognition  Overall Cognitive Status: Impaired/Different from baseline Arousal/Alertness: Awake/alert Orientation Level: Oriented X4 Attention:  [facility]) Memory: Impaired Memory Impairment: Decreased short term memory;Decreased recall of new information Decreased Short Term Memory: Verbal basic Awareness: Appears intact Problem Solving: Impaired Problem Solving Impairment: Functional basic Executive Function: Organizing Organizing: Impaired Organizing Impairment: Functional complex Safety/Judgment: Appears intact    Comprehension  Auditory Comprehension Overall Auditory Comprehension: Appears within functional limits for tasks assessed Visual Recognition/Discrimination Discrimination: Within Function Limits Reading Comprehension Reading Status: Not tested    Expression Expression Primary Mode of Expression: Verbal Verbal Expression Overall Verbal Expression: Appears within functional limits for tasks assessed Written Expression Dominant Hand: Right Written Expression: Not tested   Oral / Motor Oral Motor/Sensory Function Overall Oral Motor/Sensory Function: Impaired Labial ROM: Reduced right Labial Symmetry: Abnormal symmetry right Lingual ROM: Within Functional Limits Lingual Symmetry: Within Functional Limits Lingual Strength: Within Functional Limits Facial Symmetry: Right droop Facial Strength: Reduced Facial Sensation: Within Functional Limits Motor Speech Overall Motor Speech: Impaired Respiration: Within functional limits Phonation: Normal Articulation: Impaired Level of Impairment: Word Intelligibility: Intelligibility  reduced Word: 75-100% accurate Phrase: 75-100% accurate Sentence: 75-100% accurate Conversation: 75-100% accurate Motor Planning: Witnin functional limits Effective Techniques: Over-articulate;Slow rate   GO     Phillip Curry 11/24/2014, 2:46 PM

## 2014-11-24 NOTE — Discharge Summary (Signed)
Physician Discharge Summary  Phillip Curry WUX:[id number] DOB: 07/25/1955 DOA: 11/23/2014  PCP: Henrine Screws, MD  Admit date: 11/23/2014 Discharge date: 11/24/2014  Time spent: greater than 30 minutes  Recommendations for Outpatient Follow-up:  Monitor LFTs while on statin Goal LDL <70 Outpatient OT, ST No driving or return to work until cleared by physician Outpatient sleep study to r/o OSA  Discharge Diagnoses:  Principal Problem:   Stroke Active Problems:   Hypertension hyperlipidemia  Discharge Condition: stable  Diet recommendation: heart healthy  Filed Weights   11/24/14 0051  Weight: 92.443 kg (203 lb 12.8 oz)    History of present illness:  Phillip Curry is an 59 y.o. RH male who reports that on Sunday he awakened for work (works 3rd shift) and noted that his right arm was numb. He proceeded to work and his symptoms resolved. He did well Monday and on Tuesday on awakening his right arm numbness had returned. At work his coworkers noted that he was not talking as usual also. The patient was in no other way affected at work. Today on awakening his wife noted that he had a facial droop and prompted the patient to come to the ED for further evaluation. Numbness has now resolved. Did not receive tPA due to delay in presentation  Hospital Course:  Admitted to telemetry.  Neurology consulted.  MRI showed Acute nonhemorrhagic infarct involving the posterior left lentiform nucleus and centrum semi ovale.  Echo showed no source of embolus.  Carotid doppler without stenosis. hgb a1c 5.2.  LDL  145.  ASA and lipitor started.  Worked with PT, OT, [facility] [place].  Noted to have cognitive defecits and it is recommended pt not drive or return to work until cleared by a physician  Procedures:  none  Consultations:  neurology  Discharge Exam: Filed Vitals:   11/24/14 1318  BP: 150/83  Pulse: 69  Temp: 98.4 F (36.9 C)  Resp: 20    General: a and o Cardiovascular:  RRR Respiratory: CTA Neuro: right facial droop. Speech dysarthric. Extremity strength intact.  Discharge Instructions   Discharge Instructions    Diet - low sodium heart healthy    Complete by:  As directed      Discharge instructions    Complete by:  As directed      Driving Restrictions    Complete by:  As directed   No driving until cleared by physician.  Do not operate heavy machinery until cleared by physician     Increase activity slowly    Complete by:  As directed           Current Discharge Medication List    START taking these medications   Details  aspirin 325 MG tablet Take 1 tablet (325 mg total) by mouth daily.    atorvastatin (LIPITOR) 40 MG tablet Take 1 tablet (40 mg total) by mouth daily at 6 PM. Qty: 30 tablet, Refills: 1      CONTINUE these medications which have NOT CHANGED   Details  Multiple Vitamin (MULTIVITAMIN WITH MINERALS) TABS tablet Take 1 tablet by mouth daily.    valsartan-hydrochlorothiazide (DIOVAN-HCT) 160-12.5 MG per tablet Take 1 tablet by mouth daily.       No Known Allergies Follow-up Information    Follow up with [facility].   Specialty:  Rehabilitation   Why:  office will call with appointment date and time   Contact information:   [place] [id number] [place] [phone]  [phone]      Follow up with Xu,Jindong, MD In 2 months.   Specialty:  Neurology   Contact information:   [address] [place] [address] [phone]       Follow up with GATES,ROBERT NEVILL, MD In 1 week.   Specialty:  Internal Medicine   Contact information:   [address] [place] [address] [phone]        The results of significant diagnostics from this hospitalization (including imaging, microbiology, ancillary and laboratory) are listed below for reference.    Significant Diagnostic Studies: Dg Chest 2  View  11/24/2014   CLINICAL DATA:  Stroke.  Hypertension.  EXAM: CHEST - 2 VIEW  COMPARISON:  None.  FINDINGS: The heart size and mediastinal contours are within normal limits. Both lungs are clear. The visualized skeletal structures are unremarkable.  IMPRESSION: Negative two view chest x-ray   Electronically Signed   By: San Morelle M.D.   On: 11/24/2014 08:00   Ct Head Wo Contrast  11/23/2014   CLINICAL DATA:  Left-sided facial droop and slurred speech.  EXAM: CT HEAD WITHOUT CONTRAST  TECHNIQUE: Contiguous axial images were obtained from the base of the skull through the vertex without intravenous contrast.  COMPARISON:  None.  FINDINGS: There is patchy low attenuation within the left basal ganglia. No evidence for acute intracranial hemorrhage or significant mass effect. Ventricles and sulci are appropriate for patient's age. Orbits are unremarkable. Paranasal sinuses are unremarkable. Calvarium is intact.  IMPRESSION: Findings compatible with acute infarct within left basal ganglia region. No intracranial hemorrhage.  These results were called by telephone at the time of interpretation on 11/23/2014 at 9:19 pm to Dr. Daleen Bo , who verbally acknowledged these results.   Electronically Signed   By: Lovey Newcomer M.D.   On: 11/23/2014 21:21   [name] Head Wo Contrast  11/23/2014   CLINICAL DATA:  Left-sided facial droop. Slurred speech difficulty finding words. Right arm numbness for 3 days.  EXAM: MRI HEAD WITHOUT CONTRAST  MRA HEAD WITHOUT CONTRAST  TECHNIQUE: Multiplanar, multiecho pulse sequences of the brain and surrounding structures were obtained without intravenous contrast. Angiographic images of the head were obtained using MRA technique without contrast.  COMPARISON:  CT head from the same day.  FINDINGS: MRI HEAD FINDINGS  The diffusion-weighted images confirm acute nonhemorrhagic infarcts involving the posterior left lentiform nucleus and left centrum semi ovale. No acute hemorrhage or  mass lesion is present. T2 changes are associated with the acute infarct in the posterior left lentiform nucleus. There is a remote lacunar infarct in the left thalamus. Scattered subcortical T2 changes bilaterally are advanced for age.  Flow is present in the major intracranial arteries. The globes and orbits are intact. Mild mucosal thickening is present in the ethmoid air cells and left frontal sinus. The remaining paranasal sinuses and the mastoid air cells are clear.  Skullbase is within normal limits. Midline structures are otherwise unremarkable.  MRA HEAD FINDINGS  The internal carotid arteries are within normal limits from the high cervical segments through the ICA terminus bilaterally. The left A1 segment is hypoplastic. There is asymmetric attenuation of anterior left MCA branch vessels. The more posterior branch vessels are intact. Right MCA branch vessels are intact. The anterior communicating artery is patent.  The left vertebral artery is slightly dominant to the right. The PICA origins are visualized and normal. The basilar artery is normal. Both posterior cerebral arteries originate from  the basilar tip. The PCA branch vessels are intact.  IMPRESSION: 1. 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 2. Age advanced subcortical white matter changes bilaterally. These likely reflect the sequela of chronic microvascular ischemia. 3. Asymmetric attenuation of anterior left in 3 branch vessels. No significant proximal stenosis or occlusion is present. These results were called by telephone at the time of interpretation on 11/23/2014 at 9:41 pm to Dr. Daleen Bo , who verbally acknowledged these results.   Electronically Signed   By: San Morelle M.D.   On: 11/23/2014 21:41   Mr Brain Wo Contrast  11/23/2014   CLINICAL DATA:  Left-sided facial droop. Slurred speech difficulty finding words. Right arm numbness for 3 days.  EXAM: MRI HEAD  WITHOUT CONTRAST  MRA HEAD WITHOUT CONTRAST  TECHNIQUE: Multiplanar, multiecho pulse sequences of the brain and surrounding structures were obtained without intravenous contrast. Angiographic images of the head were obtained using MRA technique without contrast.  COMPARISON:  CT head from the same day.  FINDINGS: MRI HEAD FINDINGS  The diffusion-weighted images confirm acute nonhemorrhagic infarcts involving the posterior left lentiform nucleus and left centrum semi ovale. No acute hemorrhage or mass lesion is present. T2 changes are associated with the acute infarct in the posterior left lentiform nucleus. There is a remote lacunar infarct in the left thalamus. Scattered subcortical T2 changes bilaterally are advanced for age.  Flow is present in the major intracranial arteries. The globes and orbits are intact. Mild mucosal thickening is present in the ethmoid air cells and left frontal sinus. The remaining paranasal sinuses and the mastoid air cells are clear.  Skullbase is within normal limits. Midline structures are otherwise unremarkable.  MRA HEAD FINDINGS  The internal carotid arteries are within normal limits from the high cervical segments through the ICA terminus bilaterally. The left A1 segment is hypoplastic. There is asymmetric attenuation of anterior left MCA branch vessels. The more posterior branch vessels are intact. Right MCA branch vessels are intact. The anterior communicating artery is patent.  The left vertebral artery is slightly dominant to the right. The PICA origins are visualized and normal. The basilar artery is normal. Both posterior cerebral arteries originate from the basilar tip. The PCA branch vessels are intact.  IMPRESSION: 1. 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 2. Age advanced subcortical white matter changes bilaterally. These likely reflect the sequela of chronic microvascular ischemia. 3. Asymmetric attenuation of  anterior left in 3 branch vessels. No significant proximal stenosis or occlusion is present. These results were called by telephone at the time of interpretation on 11/23/2014 at 9:41 pm to Dr. Daleen Bo , who verbally acknowledged these results.   Electronically Signed   By: San Morelle M.D.   On: 11/23/2014 21:41   Carotid doppler The vertebral arteries appear patent with antegrade flow. - Findings consistent with 1- 39 percent stenosis involving the right internal carotid artery and the left internal carotid artery.  Echo Left ventricle: The cavity size was normal. Systolic function was normal. The estimated ejection fraction was in the range of 55% to 60%. Wall motion was normal; there were no regional wall motion abnormalities. Left ventricular diastolic function parameters were normal.  Microbiology: No results found for this or any previous visit (from the past 240 hour(s)).   Labs: Basic Metabolic Panel:  Recent Labs Lab 11/23/14 2032 11/23/14 2042 11/24/14 0450  NA 138 140 140  K 3.2*  3.3* 4.2  CL 103 102 106  CO2 28  --  29  GLUCOSE 114* 109* 104*  BUN 12 11 7   CREATININE 0.99 1.00 0.97  CALCIUM 9.6  --  9.3   Liver Function Tests:  Recent Labs Lab 11/23/14 2032 11/24/14 0450  AST 22 20  ALT 19 17  ALKPHOS 50 42  BILITOT 1.0 0.8  PROT 8.1 6.8  ALBUMIN 4.5 3.6   No results for input(s): LIPASE, AMYLASE in the last 168 hours. No results for input(s): AMMONIA in the last 168 hours. CBC:  Recent Labs Lab 11/23/14 2032 11/23/14 2042 11/24/14 0450  WBC 8.8  --  6.3  NEUTROABS 6.7  --  4.1  HGB 14.5 16.0 12.9*  HCT 43.3 47.0 39.1  MCV 100.5*  --  99.7  PLT 272  --  243   Cardiac Enzymes: No results for input(s): CKTOTAL, CKMB, CKMBINDEX, TROPONINI in the last 168 hours. BNP: BNP (last 3 results) No results for input(s): BNP in the last 8760 hours.  ProBNP (last 3 results) No results for input(s): PROBNP in the last 8760  hours.  CBG: No results for input(s): GLUCAP in the last 168 hours.     SignedDelfina Redwood  [facility] 11/24/2014, 5:24 PM

## 2014-11-24 NOTE — Evaluation (Addendum)
Physical Therapy Evaluation Patient Details Name: Phillip Curry MRN: [id number] DOB: 03-Jul-1955 Today's Date: 11/24/2014   History of Present Illness  59 y.o. RH male who reports that on Sunday he awakened for work (works 3rd shift) and noted that his right arm was numb. 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 Performed increased ambulation, DGI and higher level balance tasks without difficulty. Strength in LEs is 5/5 symmetrical. OF NOTE: patient did demonstrate some difficulty problem solving how to use call bell but otherwise cognition WFL for tasks assessed. At this time, feel patient does not need any further acute PT services will sign off. Recommend SLP evaluation for cognitive impairments.    Follow Up Recommendations No PT follow up    Equipment Recommendations  None recommended by PT    Recommendations for Other Services       Precautions / Restrictions Restrictions Weight Bearing Restrictions: No      Mobility  Bed Mobility Overal bed mobility: Independent                Transfers Overall transfer level: Independent                  Ambulation/Gait Ambulation/Gait assistance: Independent Ambulation Distance (Feet): 380 Feet Assistive device: None Gait Pattern/deviations: WFL(Within Functional Limits)        Stairs Stairs: Yes Stairs assistance: Modified independent (Device/Increase time) Stair Management: One rail Right Number of Stairs: 5 General stair comments: no difficulty  Wheelchair Mobility    Modified Rankin (Stroke Patients Only) Modified Rankin (Stroke Patients Only) Pre-Morbid Rankin Score: No symptoms Modified Rankin: Moderate disability     Balance Overall balance assessment: No apparent balance deficits (not formally assessed)                               Standardized Balance Assessment Standardized Balance Assessment :  Dynamic Gait Index   Dynamic Gait Index Level Surface: Normal Change in Gait Speed: Normal Gait with Horizontal Head Turns: Normal Gait with Vertical Head Turns: Normal Gait and Pivot Turn: Normal Step Over Obstacle: Mild Impairment Step Around Obstacles: Normal Steps: Mild Impairment Total Score: 22       Pertinent Vitals/Pain      Home Living Family/patient expects to be discharged to:: Private residence Living Arrangements: Spouse/significant other Available Help at Discharge: Family;Available PRN/intermittently Type of Home: House Home Access: Stairs to enter Entrance Stairs-Rails: Can reach both Entrance Stairs-Number of Steps: 4 Home Layout: One level Home Equipment: None      Prior Function Level of Independence: Independent               Hand Dominance   Dominant Hand: Right    Extremity/Trunk Assessment   Upper Extremity Assessment: Defer to OT evaluation           Lower Extremity Assessment: Overall WFL for tasks assessed         Communication      Cognition Arousal/Alertness: Awake/alert Behavior During Therapy: [facility] for tasks assessed/performed;Impulsive Overall Cognitive Status: Impaired/Different from baseline Area of Impairment: Problem solving               General Comments: pt able to perform some cognition with ambulation. OF NOTE: patient did demonstrate difficulty problem solving with call bell on how to contact nurse. Otherwise WFL.    General Comments General comments (skin integrity, edema, etc.): Pt was able  to perform some cognitive tasks during mobility without evidence of instability. Patient ambulated with serial sevens, backwards counting and spelling name fwd and backwards with no change in gait.     Exercises        Assessment/Plan    PT Assessment Patent does not need any further PT services  PT Diagnosis Difficulty walking   PT Problem List    PT Treatment Interventions     PT Goals (Current goals  can be found in the Care Plan section) Acute Rehab PT Goals Patient Stated Goal: to go home PT Goal Formulation: All assessment and education complete, DC therapy    Frequency     Barriers to discharge        Co-evaluation               End of Session   Activity Tolerance: Patient tolerated treatment well;No increased pain Patient left: in chair;with call bell/phone within reach Nurse Communication: Mobility status         Time: 0803-0826 PT Time Calculation (min) (ACUTE ONLY): 23 min   Charges:   PT Evaluation $Initial PT Evaluation Tier I: 1 Procedure PT Treatments $Gait Training: 8-22 mins   PT G CodesDuncan Dull 12/23/14, 8:36 AM Alben Deeds, PT DPT  [phone]

## 2014-11-24 NOTE — Consult Note (Signed)
Referring Physician: Posey Pronto    Chief Complaint: Difficulty with speech  HPI: Phillip Curry is an 59 y.o. RH male who reports that on Sunday he awakened for work (works 3rd shift) and noted that his right arm was numb.  He proceeded to work and his symptoms resolved.  He did well Monday and on Tuesday on awakening his right arm numbness had returned. At work his coworkers noted that he was not talking as usual also.  The patient was in no other way affected at work.  Today on awakening his wife noted that he had a facial droop and prompted the patient to come to the ED for further evaluation.  Numbness has now resolved.    Date last known well: Date: 11/23/2014 Time last known well: Time: 15:00 tPA Given: No: Outside time window  Past Medical History  Diagnosis Date  . Hypertension     Past Surgical History  Procedure Laterality Date  . No past surgeries      Family History  Problem Relation Age of Onset  . Hypertension Mother   Mother deceased from pancreatic cancer.  Father alive and well with a history of lung cancer.    Social History:  reports that he has never smoked. He does not have any smokeless tobacco history on file. He reports that he drinks alcohol. His drug history is not on file.  Allergies: No Known Allergies  Medications:  I have reviewed the patient's current medications. Prior to Admission:  Prescriptions prior to admission  Medication Sig Dispense Refill Last Dose  . Multiple Vitamin (MULTIVITAMIN WITH MINERALS) TABS tablet Take 1 tablet by mouth daily.   Past Week at Unknown time  . valsartan-hydrochlorothiazide (DIOVAN-HCT) 160-12.5 MG per tablet Take 1 tablet by mouth daily.   11/23/2014 at Unknown time   Scheduled: .  stroke: mapping our early stages of recovery book   Does not apply Once  . sodium chloride   Intravenous STAT  . aspirin  300 mg Rectal Daily   Or  . aspirin  325 mg Oral Daily  . enoxaparin (LOVENOX) injection  40 mg Subcutaneous Q24H  .  irbesartan  150 mg Oral Daily  . potassium chloride  20 mEq Oral Once    ROS: History obtained from the patient  General ROS: negative for - chills, fatigue, fever, night sweats, weight gain or weight loss Psychological ROS: negative for - behavioral disorder, hallucinations, memory difficulties, mood swings or suicidal ideation Ophthalmic ROS: negative for - blurry vision, double vision, eye pain or loss of vision ENT ROS: negative for - epistaxis, nasal discharge, oral lesions, sore throat, tinnitus or vertigo Allergy and Immunology ROS: negative for - hives or itchy/watery eyes Hematological and Lymphatic ROS: negative for - bleeding problems, bruising or swollen lymph nodes Endocrine ROS: negative for - galactorrhea, hair pattern changes, polydipsia/polyuria or temperature intolerance Respiratory ROS: negative for - cough, hemoptysis, shortness of breath or wheezing Cardiovascular ROS: negative for - chest pain, dyspnea on exertion, edema or irregular heartbeat Gastrointestinal ROS: negative for - abdominal pain, diarrhea, hematemesis, nausea/vomiting or stool incontinence Genito-Urinary ROS: negative for - dysuria, hematuria, incontinence or urinary frequency/urgency Musculoskeletal ROS: negative for - joint swelling or muscular weakness Neurological ROS: as noted in HPI Dermatological ROS: negative for rash and skin lesion changes  Physical Examination: Blood pressure 163/92, pulse 72, temperature 98.9 F (37.2 C), temperature source Oral, resp. rate 18, height 5\' 10"  (1.778 m), weight 92.443 kg (203 lb 12.8 oz), SpO2 100 %.  HEENT-  Normocephalic, no lesions, without obvious abnormality.  Normal external eye and conjunctiva.  Normal TM's bilaterally.  Normal auditory canals and external ears. Normal external nose, mucus membranes and septum.  Normal pharynx. Cardiovascular- S1, S2 normal, pulses palpable throughout   Lungs- chest clear, no wheezing, rales, normal symmetric air  entry Abdomen- soft, non-tender; bowel sounds normal; no masses,  no organomegaly Extremities- no edema Lymph-no adenopathy palpable Musculoskeletal-no joint tenderness, deformity or swelling Skin-warm and dry, no hyperpigmentation, vitiligo, or suspicious lesions  Neurological Examination Mental Status: Alert, oriented.  Confusion noted in patient's ability to give history and tell handedness.  Speech fluent without evidence of aphasia.  Dysarthric.  Able to follow 3 step commands without difficulty. Cranial Nerves: II: Discs flat bilaterally; Visual fields grossly normal, pupils equal, round, reactive to light and accommodation III,IV, VI: ptosis not present, extra-ocular motions intact bilaterally V,VII: right facial droop, facial light touch sensation normal bilaterally VIII: hearing normal bilaterally IX,X: gag reflex present XI: bilateral shoulder shrug XII: midline tongue extension Motor: Right : Upper extremity   5-/5 With pronator drift   Left:     Upper extremity   5/5  Lower extremity   5/5       Lower extremity   5/5 Tone and bulk:normal tone throughout; no atrophy noted Sensory: Pinprick and light touch intact throughout, bilaterally Deep Tendon Reflexes: 2+ and symmetric throughout Plantars: Right: downgoing   Left: downgoing Cerebellar: normal finger-to-nose and normal heel-to-shin testing bilaterally Gait: not tested      Laboratory Studies:  Basic Metabolic Panel:  Recent Labs Lab 11/23/14 2032 11/23/14 2042  NA 138 140  K 3.2* 3.3*  CL 103 102  CO2 28  --   GLUCOSE 114* 109*  BUN 12 11  CREATININE 0.99 1.00  CALCIUM 9.6  --     Liver Function Tests:  Recent Labs Lab 11/23/14 2032  AST 22  ALT 19  ALKPHOS 50  BILITOT 1.0  PROT 8.1  ALBUMIN 4.5   No results for input(s): LIPASE, AMYLASE in the last 168 hours. No results for input(s): AMMONIA in the last 168 hours.  CBC:  Recent Labs Lab 11/23/14 2032 11/23/14 2042  WBC 8.8  --    NEUTROABS 6.7  --   HGB 14.5 16.0  HCT 43.3 47.0  MCV 100.5*  --   PLT 272  --     Cardiac Enzymes: No results for input(s): CKTOTAL, CKMB, CKMBINDEX, TROPONINI in the last 168 hours.  BNP: Invalid input(s): POCBNP  CBG: No results for input(s): GLUCAP in the last 168 hours.  Microbiology: No results found for this or any previous visit.  Coagulation Studies:  Recent Labs  11/23/14 2032  LABPROT 13.1  INR 0.97    Urinalysis: No results for input(s): COLORURINE, LABSPEC, PHURINE, GLUCOSEU, HGBUR, BILIRUBINUR, KETONESUR, PROTEINUR, UROBILINOGEN, NITRITE, LEUKOCYTESUR in the last 168 hours.  Invalid input(s): APPERANCEUR  Lipid Panel: No results found for: CHOL, TRIG, HDL, CHOLHDL, VLDL, LDLCALC  HgbA1C: No results found for: HGBA1C  Urine Drug Screen:  No results found for: LABOPIA, COCAINSCRNUR, LABBENZ, AMPHETMU, THCU, LABBARB  Alcohol Level: No results for input(s): ETH in the last 168 hours.  Other results: EKG: sinus rhythm at 77 bpm.  Imaging: Ct Head Wo Contrast  11/23/2014   CLINICAL DATA:  Left-sided facial droop and slurred speech.  EXAM: CT HEAD WITHOUT CONTRAST  TECHNIQUE: Contiguous axial images were obtained from the base of the skull through the vertex without intravenous contrast.  COMPARISON:  None.  FINDINGS: There is patchy low attenuation within the left basal ganglia. No evidence for acute intracranial hemorrhage or significant mass effect. Ventricles and sulci are appropriate for patient's age. Orbits are unremarkable. Paranasal sinuses are unremarkable. Calvarium is intact.  IMPRESSION: Findings compatible with acute infarct within left basal ganglia region. No intracranial hemorrhage.  These results were called by telephone at the time of interpretation on 11/23/2014 at 9:19 pm to Dr. Daleen Bo , who verbally acknowledged these results.   Electronically Signed   By: Lovey Newcomer M.D.   On: 11/23/2014 21:21   [name] Head Wo Contrast  11/23/2014    CLINICAL DATA:  Left-sided facial droop. Slurred speech difficulty finding words. Right arm numbness for 3 days.  EXAM: MRI HEAD WITHOUT CONTRAST  MRA HEAD WITHOUT CONTRAST  TECHNIQUE: Multiplanar, multiecho pulse sequences of the brain and surrounding structures were obtained without intravenous contrast. Angiographic images of the head were obtained using MRA technique without contrast.  COMPARISON:  CT head from the same day.  FINDINGS: MRI HEAD FINDINGS  The diffusion-weighted images confirm acute nonhemorrhagic infarcts involving the posterior left lentiform nucleus and left centrum semi ovale. No acute hemorrhage or mass lesion is present. T2 changes are associated with the acute infarct in the posterior left lentiform nucleus. There is a remote lacunar infarct in the left thalamus. Scattered subcortical T2 changes bilaterally are advanced for age.  Flow is present in the major intracranial arteries. The globes and orbits are intact. Mild mucosal thickening is present in the ethmoid air cells and left frontal sinus. The remaining paranasal sinuses and the mastoid air cells are clear.  Skullbase is within normal limits. Midline structures are otherwise unremarkable.  MRA HEAD FINDINGS  The internal carotid arteries are within normal limits from the high cervical segments through the ICA terminus bilaterally. The left A1 segment is hypoplastic. There is asymmetric attenuation of anterior left MCA branch vessels. The more posterior branch vessels are intact. Right MCA branch vessels are intact. The anterior communicating artery is patent.  The left vertebral artery is slightly dominant to the right. The PICA origins are visualized and normal. The basilar artery is normal. Both posterior cerebral arteries originate from the basilar tip. The PCA branch vessels are intact.  IMPRESSION: 1. 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 2. Age advanced subcortical  white matter changes bilaterally. These likely reflect the sequela of chronic microvascular ischemia. 3. Asymmetric attenuation of anterior left in 3 branch vessels. No significant proximal stenosis or occlusion is present. These results were called by telephone at the time of interpretation on 11/23/2014 at 9:41 pm to Dr. Daleen Bo , who verbally acknowledged these results.   Electronically Signed   By: San Morelle M.D.   On: 11/23/2014 21:41   Mr Brain Wo Contrast  11/23/2014   CLINICAL DATA:  Left-sided facial droop. Slurred speech difficulty finding words. Right arm numbness for 3 days.  EXAM: MRI HEAD WITHOUT CONTRAST  MRA HEAD WITHOUT CONTRAST  TECHNIQUE: Multiplanar, multiecho pulse sequences of the brain and surrounding structures were obtained without intravenous contrast. Angiographic images of the head were obtained using MRA technique without contrast.  COMPARISON:  CT head from the same day.  FINDINGS: MRI HEAD FINDINGS  The diffusion-weighted images confirm acute nonhemorrhagic infarcts involving the posterior left lentiform nucleus and left centrum semi ovale. No acute hemorrhage or mass lesion is present. T2 changes are associated with the acute  infarct in the posterior left lentiform nucleus. There is a remote lacunar infarct in the left thalamus. Scattered subcortical T2 changes bilaterally are advanced for age.  Flow is present in the major intracranial arteries. The globes and orbits are intact. Mild mucosal thickening is present in the ethmoid air cells and left frontal sinus. The remaining paranasal sinuses and the mastoid air cells are clear.  Skullbase is within normal limits. Midline structures are otherwise unremarkable.  MRA HEAD FINDINGS  The internal carotid arteries are within normal limits from the high cervical segments through the ICA terminus bilaterally. The left A1 segment is hypoplastic. There is asymmetric attenuation of anterior left MCA branch vessels. The more  posterior branch vessels are intact. Right MCA branch vessels are intact. The anterior communicating artery is patent.  The left vertebral artery is slightly dominant to the right. The PICA origins are visualized and normal. The basilar artery is normal. Both posterior cerebral arteries originate from the basilar tip. The PCA branch vessels are intact.  IMPRESSION: 1. 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 2. Age advanced subcortical white matter changes bilaterally. These likely reflect the sequela of chronic microvascular ischemia. 3. Asymmetric attenuation of anterior left in 3 branch vessels. No significant proximal stenosis or occlusion is present. These results were called by telephone at the time of interpretation on 11/23/2014 at 9:41 pm to Dr. Daleen Bo , who verbally acknowledged these results.   Electronically Signed   By: San Morelle M.D.   On: 11/23/2014 21:41    Assessment: 59 y.o. male presenting with dysarthria, right facial droop and mild RUE weakness.  Symptoms greater than 24 hours old.  MRI of the brain independently reviewed and shows an acute left lentiform nucleus infarct.  Patient with a history of hypertension but compliant with medications.  On no antiplatelet therapy at home.    Stroke Risk Factors - hypertension  Plan: 1. HgbA1c, fasting lipid panel 2. PT consult, OT consult, Speech consult 3. Echocardiogram 4. Carotid dopplers 5. Prophylactic therapy-Antiplatelet med: Aspirin - dose 325mg  daily 6. NPO until RN stroke swallow screen 7. Telemetry monitoring 8. Frequent neuro checks    Alexis Goodell, MD [facility] [phone] 11/24/2014, 1:08 AM

## 2014-11-24 NOTE — Evaluation (Signed)
Occupational Therapy Evaluation Patient Details Name: Phillip Curry MRN: [id number] DOB: 1956/01/16 Today's Date: 11/24/2014    History of Present Illness 59 y.o. RH male who reports that on Sunday he awakened for work (works 3rd shift) and noted that his right arm was numb. 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 OT did recommend supervision when transferring in and out of shower in order to decrease fall risk at home. Pt given BIMS assessment during evaluation and pt was able to immediately recall information but was only able to name 1/3 words with given cues 5 minutes later. Pt also unable to state therapist name by end of session. Pt able to ambulate in hallway with mod I and answer questions as appropriate with no LOB but requiring increased time for processing. OT recommends outpatient therapy for higher level IADL skills. Pt with no further questions or concerns. At this time, pt does not need further OT intervention. OT signed off. Thank you for referral.          Follow Up Recommendations  Outpatient OT;Supervision - Intermittent    Equipment Recommendations  None recommended by OT    Recommendations for Other Services  (none)     Precautions / Restrictions Precautions Precautions: None Restrictions Weight Bearing Restrictions: No      Mobility Bed Mobility Overal bed mobility: Independent                Transfers Overall transfer level: Independent Equipment used: None                  Balance Overall balance assessment: No apparent balance deficits (not formally assessed)                                          ADL Overall ADL's : Needs assistance/impaired                                       General ADL Comments: Pt will require supervision for LB self care and shower transfer  for safety. All other self care at mod I level without device.      Vision Vision Assessment?: Yes Eye Alignment: Within Functional Limits Ocular Range of Motion: Within Functional Limits Alignment/Gaze Preference: Within Defined Limits Tracking/Visual Pursuits: Able to track stimulus in all quads without difficulty Saccades: Within functional limits Convergence: Within functional limits Visual Fields: No apparent deficits   Perception     Praxis      Pertinent Vitals/Pain Pain Assessment: No/denies pain     Hand Dominance Right   Extremity/Trunk Assessment Upper Extremity Assessment Upper Extremity Assessment: Overall WFL for tasks assessed   Lower Extremity Assessment Lower Extremity Assessment: Defer to PT evaluation   Cervical / Trunk Assessment Cervical / Trunk Assessment: Normal   Communication     Cognition Arousal/Alertness: Awake/alert Behavior During Therapy: WFL for tasks assessed/performed;Impulsive Overall Cognitive Status: Impaired/Different from baseline Area of Impairment: Problem solving;Memory     Memory: Decreased short-term memory       Problem Solving: Slow processing General Comments: Pt give BIM during assessment and able to immediately recall 3/3 words when asked 5 minutes later with cues pt only able to state 1/3 words.  General Comments       Exercises       Shoulder Instructions      Home Living Family/patient expects to be discharged to:: Private residence Living Arrangements: Spouse/significant other Available Help at Discharge: Family;Available PRN/intermittently Type of Home: House Home Access: Stairs to enter [employer] of Steps: 4 Entrance Stairs-Rails: Can reach both Home Layout: One level     Bathroom Shower/Tub: Walk-in shower;Door Shower/tub characteristics: Charity fundraiser: Standard     Home Equipment: None      Lives With: Spouse    Prior Functioning/Environment Level of  Independence: Independent             OT Diagnosis: Generalized weakness   OT Problem List: Decreased safety awareness;Decreased cognition   OT Treatment/Interventions:      OT Goals(Current goals can be found in the care plan section) Acute Rehab OT Goals Patient Stated Goal: to go home OT Goal Formulation: With patient Time For Goal Achievement:  (OT assessment completed, D/C therapy)  OT Frequency:     Barriers to D/C:            Co-evaluation              End of Session    Activity Tolerance: Patient tolerated treatment well Patient left: in bed;with call bell/phone within reach   Time: 1611-1620 OT Time Calculation (min): 9 min Charges:  OT General Charges $OT Visit: 1 Procedure OT Evaluation $Initial OT Evaluation Tier I: 1 Procedure  Phineas Semen, MS, OTR/L 11/24/2014, 4:31 PM

## 2014-11-24 NOTE — Progress Notes (Signed)
To whom it may concern:   Grey Rakestraw has been hospitalized and will be unable to work from 11/24/14 - 12/25/14 due to medical condition.   Sincerely,     Doree Barthel, MD [facility]

## 2014-11-24 NOTE — Progress Notes (Signed)
D/C orders received, pt for D/C home today with home health SLP and OT.  IV and telemetry D/C.  Rx and D/C instructions given with verbalized understanding.  Family at bedside to assist with D/C.  Staff brought pt downstairs via wheelchair.

## 2014-11-24 NOTE — Care Management Note (Signed)
Case Management Note  Patient Details  Name: Phillip Curry MRN: [id number] Date of Birth: [birth year]  Subjective/Objective:                    Action/Plan:  Met with patient to discuss discharge planning. Patient is listed as self pay, but per account note patient's [employer] has been verified by financial counseling. Patient DOES have a PCP, Dr Inda Merlin.  Patient is agreeable to outpatient speech therapy, and has chosen [facility].  Order was faxed. Patient provided a preferred contact number of [phone].  Written information was provided and added to the AVS. Expected Discharge Date:                  Expected Discharge Plan:  Home/Self Care  In-House Referral:     Discharge planning Services  CM Consult  Post Acute Care Choice:    Choice offered to:  Patient  DME Arranged:    DME Agency:     HH Arranged:    [place] Agency:     Status of Service:  Completed, signed off  Medicare Important Message Given:    Date Medicare IM Given:    Medicare IM give by:    Date Additional Medicare IM Given:    Additional Medicare Important Message give by:     If discussed at [place] of Stay Meetings, dates discussed:    Additional Comments:  Rolm Baptise, RN 11/24/2014, 4:32 PM

## 2014-11-25 LAB — HEMOGLOBIN A1C
HEMOGLOBIN A1C: 5.2 % (ref 4.8–5.6)
Mean Plasma Glucose: 103 mg/dL

## 2014-11-26 ENCOUNTER — Encounter (HOSPITAL_COMMUNITY): Payer: Self-pay | Admitting: Family Medicine

## 2014-11-26 ENCOUNTER — Emergency Department (HOSPITAL_COMMUNITY): Payer: BLUE CROSS/BLUE SHIELD

## 2014-11-26 ENCOUNTER — Emergency Department (HOSPITAL_COMMUNITY)
Admission: EM | Admit: 2014-11-26 | Discharge: 2014-11-26 | Disposition: A | Discharge status: Home / Self Care | Payer: BLUE CROSS/BLUE SHIELD | Attending: Emergency Medicine | Admitting: Emergency Medicine

## 2014-11-26 DIAGNOSIS — I10 Essential (primary) hypertension: Secondary | ICD-10-CM | POA: Diagnosis not present

## 2014-11-26 DIAGNOSIS — R51 Headache: Secondary | ICD-10-CM | POA: Insufficient documentation

## 2014-11-26 DIAGNOSIS — M79662 Pain in left lower leg: Secondary | ICD-10-CM | POA: Diagnosis not present

## 2014-11-26 DIAGNOSIS — R531 Weakness: Secondary | ICD-10-CM | POA: Insufficient documentation

## 2014-11-26 DIAGNOSIS — Z8673 Personal history of transient ischemic attack (TIA), and cerebral infarction without residual deficits: Secondary | ICD-10-CM | POA: Insufficient documentation

## 2014-11-26 DIAGNOSIS — R29898 Other symptoms and signs involving the musculoskeletal system: Secondary | ICD-10-CM

## 2014-11-26 DIAGNOSIS — R52 Pain, unspecified: Secondary | ICD-10-CM

## 2014-11-26 DIAGNOSIS — Z79899 Other long term (current) drug therapy: Secondary | ICD-10-CM | POA: Diagnosis not present

## 2014-11-26 DIAGNOSIS — M79605 Pain in left leg: Secondary | ICD-10-CM

## 2014-11-26 DIAGNOSIS — Z7982 Long term (current) use of aspirin: Secondary | ICD-10-CM | POA: Insufficient documentation

## 2014-11-26 HISTORY — DX: Cerebral infarction, unspecified: I63.9

## 2014-11-26 MED ORDER — CEPHALEXIN 250 MG PO CAPS
500.0000 mg | ORAL_CAPSULE | Freq: Once | ORAL | Status: AC
  Administered 2014-11-26: 500 mg via ORAL
  Filled 2014-11-26: qty 2

## 2014-11-26 MED ORDER — CEPHALEXIN 500 MG PO CAPS: 500.0000 mg | ORAL_CAPSULE | Freq: Three times a day (TID) | ORAL | Status: DC

## 2014-11-26 NOTE — ED Provider Notes (Signed)
CSN: [id number]     Arrival date & time 11/26/14  1143 History   First MD Initiated Contact with Patient 11/26/14 1154     Chief Complaint  Patient presents with  . Cerebrovascular Accident      HPI Patient presents to the emergency department complaining of mild discomfort in his left lower calf just above the left ankle.  Reports mild pain and swelling in this region.  No swelling of the remainder of his left leg.  No numbness or weakness of his left leg.  He was recently hospitalized for an acute stroke and has ongoing right-sided deficits.  He feels like his right arm is slightly weaker than at time of discharge.  He denies headache.  Her chest pain shortness of breath.  Denies abdominal pain.  No nausea vomiting or diarrhea.  Denies fevers and chills.  No history of DVT.   Past Medical History  Diagnosis Date  . Hypertension   . Stroke    Past Surgical History  Procedure Laterality Date  . No past surgeries     Family History  Problem Relation Age of Onset  . Hypertension Mother    History  Substance Use Topics  . Smoking status: Never Smoker   . Smokeless tobacco: Not on file  . Alcohol Use: Yes    Review of Systems  All other systems reviewed and are negative.     Allergies  Review of patient's allergies indicates no known allergies.  Home Medications   Prior to Admission medications   Medication Sig Start Date End Date Taking? Authorizing Provider  aspirin 325 MG tablet Take 1 tablet (325 mg total) by mouth daily. 11/24/14  Yes Delfina Redwood, MD  atorvastatin (LIPITOR) 40 MG tablet Take 1 tablet (40 mg total) by mouth daily at 6 PM. 11/24/14  Yes Delfina Redwood, MD  Multiple Vitamin (MULTIVITAMIN WITH MINERALS) TABS tablet Take 1 tablet by mouth daily.   Yes Historical Provider, MD  valsartan-hydrochlorothiazide (DIOVAN-HCT) 160-12.5 MG per tablet Take 1 tablet by mouth daily. 11/09/14  Yes Historical Provider, MD  cephALEXin (KEFLEX) 500 MG capsule Take 1  capsule (500 mg total) by mouth 3 (three) times daily. 11/26/14   Jola Schmidt, MD   BP 139/79 mmHg  Pulse 72  Temp(Src) 99 F (37.2 C) (Oral)  Resp 23  Ht 5\' 10"  (1.778 m)  Wt 203 lb (92.08 kg)  BMI 29.13 kg/m2  SpO2 98% Physical Exam  Constitutional: He is oriented to person, place, and time. He appears well-developed and well-nourished.  HENT:  Head: Normocephalic and atraumatic.  Eyes: EOM are normal.  Neck: Normal range of motion.  Cardiovascular: Normal rate, regular rhythm, normal heart sounds and intact distal pulses.   Pulmonary/Chest: Effort normal and breath sounds normal. No respiratory distress.  Abdominal: Soft. He exhibits no distension. There is no tenderness.  Musculoskeletal: Normal range of motion.  Very small area of warmth, mild erythema, mild swelling of his posterior distal left tibial region.  There is no fluctuance in this region.  He has normal pulses in his left foot.  Remainder of left lower extremities without swelling.  No tenderness along the left medial thigh.  Full range of motion of left knee and left ankle.  Neurological: He is alert and oriented to person, place, and time.  4/5 strength of his right upper extremity as compared to his left  Skin: Skin is warm and dry.  Psychiatric: He has a normal mood and affect. Judgment  normal.  Nursing note and vitals reviewed.   ED Course  Procedures (including critical care time) Labs Review Labs Reviewed - No data to display  Imaging Review Ct Head Wo Contrast  11/26/2014   CLINICAL DATA:  59 year old male with history of stroke 1 week ago. Hypertension. Right arm weakness today.  EXAM: CT HEAD WITHOUT CONTRAST  TECHNIQUE: Contiguous axial images were obtained from the base of the skull through the vertex without intravenous contrast.  COMPARISON:  Head CT 11/23/2014.  Brain MRI 11/23/2014.  FINDINGS: More conspicuous area of subtle ill-defined low-attenuation in the left centrum semiovale extending into the  left basal ganglia, compatible with evolving subacute ischemia (acute infarct in this region was noted on prior brain MRI 11/23/2014). No acute intracranial abnormalities. Specifically, no evidence of acute intracranial hemorrhage, no definite findings of acute/subacute cerebral ischemia, no mass, mass effect, hydrocephalus or abnormal intra or extra-axial fluid collections. Visualized paranasal sinuses and mastoids are well pneumatized. No acute displaced skull fractures are identified.  IMPRESSION: 1. More apparent low-attenuation in the left basal ganglia extending into the left centrum semiovale, which is expected given the recently diagnosis acute infarct in these regions on prior MRI 11/23/2014. No new acute findings are noted on today's study.   Electronically Signed   By: Vinnie Langton M.D.   On: 11/26/2014 13:13     EKG Interpretation None      MDM   Final diagnoses:  Pain  Left leg pain  Right arm weakness    Small area of possible phlebitis or early cellulitis of his left lower extremity.  Doubt DVT.  This focal area of swelling.  There is no fluctuance to suggest need for incision and drainage at this time.  Vital signs are normal.  She does have mild weakness of his right upper extremity.  CT imaging today is negative for acute process.  This is likely evolution of stroke.  He will call neurology for more expedited follow-up.  He understands to return to the ER for new or worsening symptoms.  His wife are doing physical therapy for his right upper extremity at home.    Jola Schmidt, MD 11/26/14 [phone]

## 2014-11-26 NOTE — ED Notes (Signed)
Pt here for increased right arm weakness since stroke 1 week ago. Pt has facial droop from previous stroke. Pt also complaining of LLE pain and warm to touch.

## 2014-11-26 NOTE — Discharge Instructions (Signed)
Phlebitis Phlebitis is soreness and swelling (inflammation) of a vein. This can occur in your arms, legs, or torso (trunk), as well as deeper inside your body. Phlebitis is usually not serious when it occurs close to the surface of the body. However, it can cause serious problems when it occurs in a vein deeper inside the body. CAUSES  Phlebitis can be triggered by various things, including:   Reduced blood flow through your veins. This can happen with:  Bed rest over a long period.  Long-distance travel.  Injury.  Surgery.  Being overweight (obese) or pregnant.  Having an IV tube put in the vein and getting certain medicines through the vein.  Cancer and cancer treatment.  Use of illegal drugs taken through the vein.  Inflammatory diseases.  Inherited (genetic) diseases that increase the risk of blood clots.  Hormone therapy, such as birth control pills. SIGNS AND SYMPTOMS   Red, tender, swollen, and painful area on your skin. Usually, the area will be long and narrow.  Firmness along the center of the affected area. This can indicate that a blood clot has formed.  Low-grade fever. DIAGNOSIS  A health care provider can usually diagnose phlebitis by examining the affected area and asking about your symptoms. To check for infection or blood clots, your health care provider may order blood tests or an ultrasound exam of the area. Blood tests and your family history may also indicate if you have an underlying genetic disease that causes blood clots. Occasionally, a piece of tissue is taken from the body (biopsy sample) if an unusual cause of phlebitis is suspected. TREATMENT  Treatment will vary depending on the severity of the condition and the area of the body affected. Treatment may include:  Use of a warm compress or heating pad.  Use of compression stockings or bandages.  Anti-inflammatory medicines.  Removal of any IV tube that may be causing the problem.  Medicines  that kill germs (antibiotics) if an infection is present.  Blood-thinning medicines if a blood clot is suspected or present.  In rare cases, surgery may be needed to remove damaged sections of vein. HOME CARE INSTRUCTIONS   Only take over-the-counter or prescription medicines as directed by your health care provider. Take all medicines exactly as prescribed.  Raise (elevate) the affected area above the level of your heart as directed by your health care provider.  Apply a warm compress or heating pad to the affected area as directed by your health care provider. Do not sleep with the heating pad.  Use compression stockings or bandages as directed. These will speed healing and prevent the condition from coming back.  If you are on blood thinners:  Get follow-up blood tests as directed by your health care provider.  Check with your health care provider before using any new medicines.  Carry a medical alert card or wear your medical alert jewelry to show that you are on blood thinners.  For phlebitis in the legs:  Avoid prolonged standing or bed rest.  Keep your legs moving. Raise your legs when sitting or lying.  Do not smoke.  Women, particularly those over the age of 86, should consider the risks and benefits of taking the contraceptive pill. This kind of hormone treatment can increase your risk for blood clots.  Follow up with your health care provider as directed. SEEK MEDICAL CARE IF:   You have unusual bruising or any bleeding problems.  Your swelling or pain in the affected area  is not improving.  You are on anti-inflammatory medicine, and you develop belly (abdominal) pain. SEEK IMMEDIATE MEDICAL CARE IF:   You have a sudden onset of chest pain or difficulty breathing.  You have a fever or persistent symptoms for more than 2-3 days.  You have a fever and your symptoms suddenly get worse. MAKE SURE YOU:  Understand these instructions.  Will watch your  condition.  Will get help right away if you are not doing well or get worse. Document Released: 04/02/2001 Document Revised: 01/27/2013 Document Reviewed: 12/14/2012 [facility] Patient Information 2015 [place], [place]. This information is not intended to replace advice given to you by your health care provider. Make sure you discuss any questions you have with your health care provider.

## 2014-12-01 ENCOUNTER — Ambulatory Visit: Payer: BLUE CROSS/BLUE SHIELD | Admitting: Physical Therapy

## 2014-12-01 ENCOUNTER — Telehealth: Payer: Self-pay | Admitting: Physical Therapy

## 2014-12-01 ENCOUNTER — Encounter: Payer: Self-pay | Admitting: Physical Therapy

## 2014-12-01 ENCOUNTER — Ambulatory Visit: Payer: BLUE CROSS/BLUE SHIELD | Attending: Internal Medicine | Admitting: Speech Pathology

## 2014-12-01 VITALS — BP 128/82 | HR 89 | Resp 16

## 2014-12-01 DIAGNOSIS — I6931 Cognitive deficits following cerebral infarction: Secondary | ICD-10-CM | POA: Insufficient documentation

## 2014-12-01 DIAGNOSIS — R269 Unspecified abnormalities of gait and mobility: Secondary | ICD-10-CM | POA: Diagnosis present

## 2014-12-01 DIAGNOSIS — I6980 Unspecified sequelae of other cerebrovascular disease: Secondary | ICD-10-CM | POA: Insufficient documentation

## 2014-12-01 DIAGNOSIS — I69898 Other sequelae of other cerebrovascular disease: Secondary | ICD-10-CM | POA: Diagnosis present

## 2014-12-01 DIAGNOSIS — IMO0002 Reserved for concepts with insufficient information to code with codable children: Secondary | ICD-10-CM

## 2014-12-01 DIAGNOSIS — R531 Weakness: Secondary | ICD-10-CM | POA: Diagnosis present

## 2014-12-01 DIAGNOSIS — R41841 Cognitive communication deficit: Secondary | ICD-10-CM | POA: Insufficient documentation

## 2014-12-01 DIAGNOSIS — I679 Cerebrovascular disease, unspecified: Secondary | ICD-10-CM | POA: Insufficient documentation

## 2014-12-01 DIAGNOSIS — I639 Cerebral infarction, unspecified: Secondary | ICD-10-CM | POA: Diagnosis present

## 2014-12-01 DIAGNOSIS — I69922 Dysarthria following unspecified cerebrovascular disease: Secondary | ICD-10-CM | POA: Insufficient documentation

## 2014-12-01 DIAGNOSIS — G8191 Hemiplegia, unspecified affecting right dominant side: Secondary | ICD-10-CM | POA: Insufficient documentation

## 2014-12-01 DIAGNOSIS — R279 Unspecified lack of coordination: Secondary | ICD-10-CM | POA: Insufficient documentation

## 2014-12-01 DIAGNOSIS — I69322 Dysarthria following cerebral infarction: Secondary | ICD-10-CM

## 2014-12-01 NOTE — Telephone Encounter (Signed)
Patient was evaluated for PT, he will also benefit from OT referral

## 2014-12-01 NOTE — Therapy (Signed)
[facility] [address] [place], [place], [address] Phone: [phone]   Fax:  [phone]  Physical Therapy Evaluation  Patient Details  Name: Phillip Curry MRN: [id number] Date of Birth: 1955-05-02 Referring Provider:  Josetta Huddle, MD  Encounter Date: 12/01/2014      PT End of Session - 12/01/14 0959    Visit Number 1   Number of Visits 20   Date for PT Re-Evaluation 02/03/15   PT Start Time 0800   PT Stop Time 0845   PT Time Calculation (min) 45 min   Activity Tolerance Patient tolerated treatment well;No increased pain;Patient limited by fatigue   Behavior During Therapy [facility] for tasks assessed/performed      Past Medical History  Diagnosis Date  . Hypertension   . Stroke     Past Surgical History  Procedure Laterality Date  . No past surgeries      Filed Vitals:   12/01/14 0820 12/01/14 0840  BP: 130/74 128/82  Pulse: 75 89  Resp: 16     Visit Diagnosis:  CVA (cerebral vascular accident) - Plan: PT plan of care cert/re-cert  Hemiplegia affecting right dominant side - Plan: PT plan of care cert/re-cert  Abnormality of gait - Plan: PT plan of care cert/re-cert  Lack of coordination due to stroke - Plan: PT plan of care cert/re-cert  Weakness due to cerebrovascular accident - Plan: PT plan of care cert/re-cert      Subjective Assessment - 12/01/14 0820    Subjective discussed the general concerns about affects of CVA on pt's life with pt and his wife; their life was normal and 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 it requries a lot of moving around and a lot of standing; they are both just leaning about CVA ; we discussed impulsive behavior, safety with transfers and gait, depression associated with life change and health changes, plan for rehab and reasonalbe timeline, need for OT , express importance for Phillip Curry to be open about what he is thinking with  family and health care professionals and be realistic about the difficulty of  dealing with CVA    Patient is accompained by: Family member   Pertinent History relatively healthy man prior to CVA on 11/23/14   Limitations Walking   How long can you sit comfortably? no problem   How long can you stand comfortably? gets tired quickly   How long can you walk comfortably? 2-5 minutes;  walked about 1/2 block on Monday limited by fatigue   Patient Stated Goals get using my right hand good again , walking and talking better   Currently in Pain? Yes   Pain Score 2    Pain Location Leg   Pain Orientation Left   Pain Descriptors / Indicators Sore   Pain Type Acute pain   Pain Onset In the past 7 days   Multiple Pain Sites No            [facility] PT Assessment - 12/01/14 0001    Assessment   Medical Diagnosis CVA   Onset Date/Surgical Date 11/23/14   Hand Dominance Right   Precautions   Precautions Fall   Restrictions   Weight Bearing Restrictions No   Balance Screen   Has the patient fallen in the past 6 months Yes   How many times? 1   Has the patient had a decrease in activity level because of a fear of falling?  Yes   Is the patient reluctant to leave their home because of a fear of falling?  No   Home [employer] residence   Living Arrangements Spouse/significant other   Available Help at Discharge Family   Type of [place] to enter   Entrance Stairs-Number of Steps 3   Additional Comments considering grab bar and tub bench   Prior Function   Level of Independence Independent with community mobility without device   Vocation Full time employment   Vocation Requirements Able to stand /walk throughout the day; functional use of right hand   Cognition   Overall Cognitive Status Within Functional Limits for tasks assessed   Attention Focused   Memory Appears intact   Behaviors Impulsive   Observation/Other Assessments   Focus  on Therapeutic Outcomes (FOTO)  44   Posture/Postural Control   Posture/Postural Control Postural limitations   Postural Limitations Weight shift left   Tone   Assessment Location Right Upper Extremity   ROM / Strength   AROM / PROM / Strength Strength   Strength   Strength Assessment Site Hip   Right/Left Hip Right   Right Hip Flexion 4/5   Right Hip Extension 3+/5   Right Hip ABduction 3+/5   Right/Left Knee Right   Right Knee Flexion 4/5   Right Knee Extension 4/5   Right/Left Ankle Right   Right Ankle Dorsiflexion 3+/5   Right Ankle Plantar Flexion 3/5   Transfers   Transfers Sit to Stand   Sit to Stand 6: Modified independent (Device/Increase time);5: Supervision   Sit to Stand Details Verbal cues for precautions/safety;Verbal cues for technique   Ambulation/Gait   Ambulation Distance (Feet) 200 Feet   Gait Pattern Step-through pattern;Decreased arm swing - right;Decreased dorsiflexion - right;Right hip hike;Right foot flat;Lateral trunk lean to left;Decreased trunk rotation;Abducted - left;Poor foot clearance - right   Ambulation Surface Level;Indoor   Gait velocity 1.2 ft/sec   Standardized Balance Assessment   Standardized Balance Assessment Berg Balance Test   Berg Balance Test   Sit to Stand Able to stand  independently using hands   Standing Unsupported Able to stand safely 2 minutes   Sitting with Back Unsupported but Feet Supported on Floor or Stool Able to sit safely and securely 2 minutes   Stand to Sit Controls descent by using hands   Transfers Able to transfer safely, definite need of hands   Standing Unsupported with Eyes Closed Able to stand 10 seconds with supervision   Standing Ubsupported with Feet Together Able to place feet together independently and stand for 1 minute with supervision   From Standing, Reach Forward with Outstretched Arm Can reach forward >12 cm safely (5")   From Standing Position, Pick up Object from Floor Unable to pick up shoe, but  reaches 2-5 cm (1-2") from shoe and balances independently   From Standing Position, Turn to Look Behind Over each Shoulder Looks behind one side only/other side shows less weight shift   Turn 360 Degrees Able to turn 360 degrees safely one side only in 4 seconds or less   Standing Unsupported, Alternately Place Feet on Step/Stool Able to complete >2 steps/needs minimal assist   Standing Unsupported, One Foot in Front Able to take small step independently and hold 30 seconds   Standing on One Leg Tries to lift leg/unable to hold 3 seconds but remains standing independently   Total Score 38   Functional Gait  Assessment   Gait assessed  --   RUE Tone   RUE Tone Flaccid                           PT Education - 12/01/14 0958    Education provided Yes   Education Details began Stroke education, weight shifting HEP   Person(s) Educated Patient;Spouse   Methods Explanation   Comprehension Need further instruction          PT Short Term Goals - 12/01/14 1001    PT SHORT TERM GOAL #1   Title Patient will demonstate safe techinque 10/10 trials with sit to/from stand transfers to various surfaces  Target date 12/31/14   Baseline supervision verbal ques; some impulsivity   Time 4   Period Weeks   Status New   PT SHORT TERM GOAL #2   Title Patient will demonstrate lowered fall risk evident by improved BERG to >45  Target date 12/31/14   Baseline 39   Time 4   Period Weeks   Status New   PT SHORT TERM GOAL #3   Title Patient will be able to walk 1000' w/o loss of balance w/o use of assisted device clearing right foot with right stride.Target date 12/31/14   Baseline poor foot clearance; limited to 200-300 feet due to fatigue   Time 4   Status New           PT Long Term Goals - 12/01/14 1159    PT LONG TERM GOAL #1   Title Patient will demonstrated low fall risk evident by scoring >50 on BERG.  Target date 01/26/15   Baseline 39   Time 8   Period Weeks   Status  New   PT LONG TERM GOAL #2   Title Patient will demonstrate ability to ambulate on varied terrain including curbs and steps independenlty w/o loss of balance and demo good safety awarness at all times for 30 minutes. Target date 01/26/15   Baseline limited by fatigue   Time 8   Period Weeks   Status New   PT LONG TERM GOAL #3   Title Functional gait will improve evident by being able to change direction, speed  and move laterally and backward w/o loss of balance.Target date 01/26/15   Baseline limits movt to linear /forward movt; changing direction and speed are limited   Time 8   Period Weeks   PT LONG TERM GOAL #4   Title Patient will improve 15% on FOTO score to demo improved subjective perception of ability    Baseline 44   Time [place] - 12/01/14 0853    Clinical Impression Statement Phillip Curry is a relatively healthy male, he suffered a left hemisphere stroke on 11/23/14 and is now dx with right side hemiparesis. HIs right upper extremity is flaccid, he was unable to demonstrate any volitional movement in the right arm except for slight shoulder shrug.  He is right handed.  He is able to follow instructions, keep attention to tasks and respond normally. He did demonstrate mild impulsivity during functional movements, but this seemed more related to him trying to move as he did before vs poor processing or sequencing.   He has mild core and lower extremity weakness and gait is affected  with moderate right foot drop (grossly 4/5 throughout) ,  he is able to walk on level surface w/o assisted device and w/o loss of balance; he was able to do a sit to stand transfer without use of left hand , but it was a maximal effort. He does have a facial droop and reports diffculty with speech (he had a SLP eval after the PT eval today)  He will need to be seen by OT.  He will benefit form skilled PT to address his core and LE weakness and improve his abilty for  dynamic balance.  I dont' think he wil need an AFO for the foot drop, I hope he will be able to regain contol of dorsiflexion during gait with ther ex and neuormuscular re-ed. He has had one fall since the CVA. His wife was present throughout the eval and very supportive and will be a great assest in recovery.        Pt will benefit from skilled therapeutic intervention in order to improve on the following deficits Abnormal gait;Decreased coordination;Difficulty walking;Impaired tone;Impaired UE functional use;Decreased endurance;Decreased activity tolerance;Decreased knowledge of precautions;Impaired perceived functional ability;Decreased balance;Decreased mobility;Decreased strength   Rehab Potential Good   Clinical Impairments Affecting Rehab Potential current dense paresis of right UE   PT Frequency 2x / week   PT Duration 8 weeks   PT Treatment/Interventions Gait training;Neuromuscular re-education;Balance training;Therapeutic exercise;Therapeutic activities;Functional mobility training;Patient/family education;Manual techniques;Vestibular;Stair training   PT Next Visit Plan Patient and wife will likely have a lot more questions about CVA; continue education as needed; Assess the usefullness of the two ex's we started today; further assess safety with walking and progress balance ex's as tolerated   PT Home Exercise Plan Started on two ex's with sit to stand transfer and posterior lean for anterior tib exercise to begin to address foot drop   Recommended Other Services OT    Consulted and Agree with Plan of Care Family member/caregiver;Patient         Problem List Patient Active Problem List   Diagnosis Date Noted  . HLD (hyperlipidemia)   . CVA (cerebral infarction) 11/23/2014  . Stroke 11/23/2014  . Hypertension 11/23/2014    Rosaura Carpenter D PT DPT 12/01/2014, 12:09 PM  [facility] [address] [place], [place],  [address] Phone: [phone]   Fax:  [phone]

## 2014-12-01 NOTE — Therapy (Signed)
[facility] [address] [place], [place], [address] Phone: [phone]   Fax:  [phone]  Speech Language Pathology Evaluation  Patient Details  Name: Phillip Curry MRN: [id number] Date of Birth: [birth year] Referring Provider:  Josetta Huddle, MD  Encounter Date: 12/01/2014      End of Session - 12/01/14 1205    Visit Number 1   Number of Visits 16   Date for SLP Re-Evaluation 01/26/15   SLP Start Time 0847   SLP Stop Time  0930   SLP Time Calculation (min) 43 min   Activity Tolerance Patient tolerated treatment well;No increased pain      Past Medical History  Diagnosis Date  . Hypertension   . Stroke     Past Surgical History  Procedure Laterality Date  . No past surgeries      There were no vitals filed for this visit.  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 Yes   Pain Score 2    Pain Location Leg   Pain Orientation Left   Pain Type Acute pain   Pain Onset In the past 7 days   General Information   Other Pertinent Information 59 y.o. RH male who reports that on Sunday he awakened for work (works 3rd shift) and noted that his right arm was numb. Imaging revealed nonhemorrhagic infarct involving the posterior left lentiform nucleus and centrum semi ovale.   Mobility Status Walks independently   Prior Functional Status   Cognitive/Linguistic Baseline Within functional limits   Type of Home House    Lives With Spouse   Available Support Family   Vocation Full time employment   Cognition   Overall Cognitive Status Impaired/Different from baseline   Area of Impairment Memory;Problem solving   Current Attention Level Selective   Memory Decreased short-term memory   Problem Solving Slow processing;Difficulty sequencing;Decreased initiation;Requires verbal cues   Oral Motor/Sensory Function   Overall Oral Motor/Sensory Function Impaired   Labial ROM Reduced right   Labial Symmetry Abnormal symmetry right   Motor Speech   Overall Motor Speech Impaired   Articulation Impaired   Level of Impairment Conversation   Intelligibility Intelligibility reduced   Word 75-100% accurate   Phrase 75-100% accurate   Sentence 75-100% accurate   Conversation 75-100% accurate   Phonation Impaired   Assessment   Therapy Diagnosis Cognitive Impairments;Dysarthria   Clinical Impression Statement Pt presents with mild to moderate cognitive linguistic impairments and mild dysarthria with slurred speech and right labial droop. Pt 85% intelligible at conversation level. Non standardized cogntive linguistic assesment revealed simple time/money problem solving with 30% accuracy and requiring moderate amouth of extended time. Verbal problem solving for simple ADL situations also slow and inaccurate. Pt demonstrates reduced oragnization of information. Pt recalled 4/5 words immediately, after SLP repeated them 3 times. He recalled 3/5 words with short delay. Spouse reports that she is managing pt's meds at this time. Poor attention to detail noted on check writing task, writing information in wrong spots. I reocmmend skilled  ST to maximize intelligibility and cognitive linguistic skills for possible return to work and to maximize independence.   Type of Dysarthria Flaccid     Pt also with right UE weakness - please order OT referral.                  ADULT SLP TREATMENT - 12/01/14 1204    Cognitive-Linquistic Treatment   Skilled Treatment Pt and spouse trained in [place] for dysarthria with  occasional min cues after instruction and modeling.            SLP Education - 12/01/14 1203    Education provided Yes   Education Details dysarthira HEP, cognitive activities he can do at home, med management   Person(s) Educated Patient;Spouse   Methods Explanation          SLP Short Term Goals - 12/01/14 1207    SLP SHORT TERM GOAL #1   Title Pt will perform dysarthria HEP with rare minimal assistance   Time 4   Period Weeks   Status New   SLP SHORT TERM GOAL #2   Title Pt will solve simple time, money, reasoning problems with 85% accuracy and occasional min A   Time 4   Period Weeks   Status New   SLP SHORT TERM GOAL #3   Title Pt will manage meds at home with compensations with rare min A over 3 sessions as reported by spouse/pt.   Time 4   Period Weeks   Status New          SLP Long Term Goals - 12/01/14 1213    SLP LONG TERM GOAL #1   Title Pt will be 95% intelligible in moderately complex conversation over 12 minutes   Time 8   Period Weeks   Status New   SLP LONG TERM GOAL #2   Title Pt will solve moderately complex time, money reasoning problems with 85% accuracy and rare min A   Time 8   Period Weeks   Status New   SLP LONG TERM GOAL #3   Title Pt will utilize compensations for schedule, daily chores, lists 2x during therapy session over 2 sessions with rare min A   Time 8   Period Weeks   Status New          Plan - 12/01/14 1207    Speech Therapy Frequency 2x / week   Duration --  8   Treatment/Interventions Oral motor exercises;Compensatory strategies;Patient/family education;Functional tasks;Cognitive reorganization;Internal/external aids;SLP instruction and feedback        Problem List Patient Active Problem List   Diagnosis Date Noted  . HLD (hyperlipidemia)   . CVA (cerebral infarction) 11/23/2014  . Stroke 11/23/2014  . Hypertension 11/23/2014    Lovvorn, Annye Rusk MS, CCC-SLP 12/01/2014, 12:29 PM  [facility] [address] [place], [place], [address] Phone: [phone]   Fax:  [phone]

## 2014-12-01 NOTE — Patient Instructions (Signed)
Anterior / Posterior Sway   Stand in neutral posture. Keeping head in neutral position, rock back and forth, toes up then heels up. Rock ____ times. Do ____ sets. Do with eyes closed.  http://bt.exer.us/266   Copyright  VHI. All rights reserved.  Anterior / Posterior Sway   Stand in neutral posture about 1 foot from a wall. Keeping head in neutral position, rock back until toes come off the floor then try to come back to neutral ; should feel the muscle along your shin working. Rock __20__ times. Do __2__ sets. Do with eyes closed.  http://bt.exer.us/266   Copyright  VHI. All rights reserved.  Anterior / Posterior Sway   Stand in neutral posture. Keeping head in neutral position, rock back and forth, toes up then heels up. Rock ____ times. Do ____ sets. Do with eyes closed.  http://bt.exer.us/266   Copyright  VHI. All rights reserved.

## 2014-12-01 NOTE — Patient Instructions (Signed)
   Do exercises 20x each, 3x a day - in the mirror, slow and big  1. Alternate pucker and smile - OOO-EEE  2. Open mouth big Ahh-OOO with mouth open big  3. Pucker and move your lips side to side  4. Puff up your cheeks with air BIG - swish air from side to side  5. Press lips together flat and pop them open   6. Pucker and kiss big    SLOW LOUD OVER-ENNUNCIATE PAUSE  PA TA KA  [place]  BUTTERCUP  CATERPILLAR  BASEBALLL PLAYER  [place]  [place]  SLOW AND BIG - EXAGGERATE YOUR MOUTH, MAKE EACH CONSONANT    .  Cognitive Activities you can do at home:   - [place] (easy level)  - [place]  On your computer, tablet or phone: Insurance [email or web address] Merchandiser, retail Hour Chocolate Fix Sort it out Environmental consultant App Photo Quiz App MixTwo App What's the Word?App

## 2014-12-07 ENCOUNTER — Ambulatory Visit: Payer: BLUE CROSS/BLUE SHIELD | Admitting: Rehabilitative and Restorative Service Providers"

## 2014-12-07 ENCOUNTER — Telehealth: Payer: Self-pay | Admitting: Rehabilitative and Restorative Service Providers"

## 2014-12-07 DIAGNOSIS — I69922 Dysarthria following unspecified cerebrovascular disease: Secondary | ICD-10-CM | POA: Diagnosis not present

## 2014-12-07 DIAGNOSIS — R269 Unspecified abnormalities of gait and mobility: Secondary | ICD-10-CM

## 2014-12-07 DIAGNOSIS — IMO0002 Reserved for concepts with insufficient information to code with codable children: Secondary | ICD-10-CM

## 2014-12-07 NOTE — Telephone Encounter (Signed)
Phillip Curry had fall over weekend due to R foot drop s/p CVA.  Please submit order for R ankle foot orthoses for R foot drop.  Patient has isolated ankle dorsiflexion strength, however does not initiate movement with good motor timing during gait activities.  Thank you. [facility] [address] [place], [place]  [address] Phone:  [phone] Fax:  [phone]

## 2014-12-07 NOTE — Patient Instructions (Signed)
Functional Quadriceps: Sit to Stand   Sit on edge of chair, feet flat on floor. Stand upright, extending knees fully. Repeat __10__ times per set. Do _1__ sets per session. Do __2__ sessions per day.  http://orth.exer.us/734   Copyright  VHI. All rights reserved.  Heel Raise: Bilateral (Standing)   Rise on balls of feet.  Hold onto counter with fingertips. Repeat __10-20__ times per set. Do _1___ sets per session. Do _2___ sessions per day.  http://orth.exer.us/38   Copyright  VHI. All rights reserved.  Toe Raise (Standing)   Rock back on heels.  Hold onto with fingertips. Repeat __10-20__ times per set. Do ___1_ sets per session. Do _2___ sessions per day.  http://orth.exer.us/42   Copyright  VHI. All rights reserved.   FLEXION: Standing - Stable (Active)   Stand, both feet flat. Bend right knee, bringing heel toward buttocks. No weight. Complete _1__ sets of _10__ repetitions. Perform __2_ sessions per day.  http://gtsc.exer.us/240   Copyright  VHI. All rights reserved.

## 2014-12-07 NOTE — Therapy (Signed)
[facility] [address] [place], [place], [address] Phone: [phone]   Fax:  [phone]  Physical Therapy Treatment  Patient Details  Name: Phillip Curry MRN: [id number] Date of Birth: 04/10/1956 Referring Provider:  Josetta Huddle, MD  Encounter Date: 12/07/2014      PT End of Session - 12/07/14 2229    Visit Number 2   Number of Visits 20   Date for PT Re-Evaluation 02/03/15   Authorization Type private insurance   PT Start Time [phone]   PT Stop Time 1022   PT Time Calculation (min) 44 min   Activity Tolerance Patient tolerated treatment well;No increased pain;Patient limited by fatigue   Behavior During Therapy [facility] for tasks assessed/performed      Past Medical History  Diagnosis Date  . Hypertension   . Stroke     Past Surgical History  Procedure Laterality Date  . No past surgeries      There were no vitals filed for this visit.  Visit Diagnosis:  Abnormality of gait  Weakness due to cerebrovascular accident      Subjective Assessment - 12/07/14 0937    Subjective The patient reports he fell over the weekend indoors while walking due to R foot drop.   Patient Stated Goals get using my right hand good again , walking and talking better   Currently in Pain? No/denies      NEUROMUSCULAR RE-EDUCATION: Sit<>stand with R leg posterior to L leg to promote improved R weight shift, sit<>stand with L LE on 2" block and then on ball with min A. Patient able to perform sit<>stand today without UE support. Foot taps between multi-height surfaces at steps working on R knee control during stance and R ankle DF to clear steps Step ups with tactile cues on R hip over knee and avoiding R knee recurvatum  THERAPEUTIC EXERCISE: Heel and toe raises for LE strength STanding R knee flexion with tactile cues   Gait: Ambulation with R UE tactile cues for R trunk elongation to promote improved R foot clearance x  230 feet Gait with R ottobach brace (posterior brace) with improved gait speed focusing on R knee control and faster speed with SBA to CGA for safety x 230 feet x 2 reps       PT Education - 12/07/14 2228    Education provided Yes   Education Details HEP: sit<>stand, knee flexion standing, heel/toe raises   Person(s) Educated Patient;Spouse   Methods Explanation;Demonstration;Handout   Comprehension Returned demonstration;Verbalized understanding          PT Short Term Goals - 12/01/14 1001    PT SHORT TERM GOAL #1   Title Patient will demonstate safe techinque 10/10 trials with sit to/from stand transfers to various surfaces  Target date 12/31/14   Baseline supervision verbal ques; some impulsivity   Time 4   Period Weeks   Status New   PT SHORT TERM GOAL #2   Title Patient will demonstrate lowered fall risk evident by improved BERG to >45  Target date 12/31/14   Baseline 39   Time 4   Period Weeks   Status New   PT SHORT TERM GOAL #3   Title Patient will be able to walk 1000' w/o loss of balance w/o use of assisted device clearing right foot with right stride.Target date 12/31/14   Baseline poor foot clearance; limited to 200-300 feet due to fatigue   Time 4   Status New  PT Long Term Goals - 12/01/14 1159    PT LONG TERM GOAL #1   Title Patient will demonstrated low fall risk evident by scoring >50 on BERG.  Target date 01/26/15   Baseline 39   Time 8   Period Weeks   Status New   PT LONG TERM GOAL #2   Title Patient will demonstrate ability to ambulate on varied terrain including curbs and steps independenlty w/o loss of balance and demo good safety awarness at all times for 30 minutes. Target date 01/26/15   Baseline limited by fatigue   Time 8   Period Weeks   Status New   PT LONG TERM GOAL #3   Title Functional gait will improve evident by being able to change direction, speed  and move laterally and backward w/o loss of balance.Target date 01/26/15    Baseline limits movt to linear /forward movt; changing direction and speed are limited   Time 8   Period Weeks   PT LONG TERM GOAL #4   Title Patient will improve 15% on FOTO score to demo improved subjective perception of ability    Baseline 44   Time 8   Period Weeks   Status New            Plan - 12/07/14 2230    Clinical Impression Statement The patient is a high fall risk due to R foot drop.  PT trialed R AFO today with improved gait speed and confidence with mobility.  He may benefit from AFO for improved safety with gait in community.  The patient demonstrated some difficulty with motor processing and with repetition would perform heel strike for heel contact during stance phase of gait.  PT to work on in therapy and only pursue AFO if needed for safe community mobility, as the patient has ankle DF 3/5 strength.  Patient does have recurvatum noted during mid stance of gait.  PT to work on motor control to improve functional mobility.   PT Next Visit Plan Review HEP,  R LE strength and forced weight shifting, R knee control (mid stance phase), ankle DF control, use AFO as needed for reciprocal gait practice   Consulted and Agree with Plan of Care Patient;Family member/caregiver   Family Member Consulted spouse        Problem List Patient Active Problem List   Diagnosis Date Noted  . HLD (hyperlipidemia)   . CVA (cerebral infarction) 11/23/2014  . Stroke 11/23/2014  . Hypertension 11/23/2014    Glyndon Tursi, PT 12/07/2014, 10:35 PM  [facility] [address] [place], [place], [address] Phone: [phone]   Fax:  [phone]

## 2014-12-08 ENCOUNTER — Ambulatory Visit: Payer: BLUE CROSS/BLUE SHIELD | Admitting: Occupational Therapy

## 2014-12-08 DIAGNOSIS — G8191 Hemiplegia, unspecified affecting right dominant side: Secondary | ICD-10-CM

## 2014-12-08 DIAGNOSIS — I69922 Dysarthria following unspecified cerebrovascular disease: Secondary | ICD-10-CM | POA: Diagnosis not present

## 2014-12-08 DIAGNOSIS — I639 Cerebral infarction, unspecified: Secondary | ICD-10-CM

## 2014-12-08 DIAGNOSIS — R269 Unspecified abnormalities of gait and mobility: Secondary | ICD-10-CM

## 2014-12-08 DIAGNOSIS — I69319 Unspecified symptoms and signs involving cognitive functions following cerebral infarction: Secondary | ICD-10-CM

## 2014-12-08 DIAGNOSIS — IMO0002 Reserved for concepts with insufficient information to code with codable children: Secondary | ICD-10-CM

## 2014-12-08 NOTE — Therapy (Signed)
[facility] [address] [place], [place], [address] Phone: [phone]   Fax:  [phone]  Occupational Therapy Evaluation  Patient Details  Name: Phillip Curry MRN: [id number] Date of Birth: [birth year] Referring Provider:  Josetta Huddle, MD  Encounter Date: 12/08/2014      OT End of Session - 12/08/14 1217    Visit Number 1   Number of Visits 17   Date for OT Re-Evaluation 02/05/15   Authorization Type BCBS   OT Start Time [phone]   OT Stop Time 1015   OT Time Calculation (min) 39 min   Activity Tolerance Patient tolerated treatment well   Behavior During Therapy [facility] for tasks assessed/performed      Past Medical History  Diagnosis Date  . Hypertension   . Stroke     Past Surgical History  Procedure Laterality Date  . No past surgeries      There were no vitals filed for this visit.  Visit Diagnosis:  Weakness due to cerebrovascular accident  CVA (cerebral vascular accident)  Hemiplegia affecting right dominant side  Lack of coordination due to stroke  Cognitive deficits following cerebral infarction  Abnormality of gait      Subjective Assessment - 12/08/14 0938    Subjective  Pt s/p CVA  11/23/14 presents with RUE weakness and decreased functional mobility.   Pertinent History see Epic snapshot   Patient Stated Goals to get better   Currently in Pain? No/denies           [facility] OT Assessment - 12/08/14 0001    Assessment   Diagnosis CVA   Onset Date 11/23/14   Assessment Pt s/p CVA  11/23/14, reports his arm became weaker on 11/27/14. Pt went back to the ED for assessment. Pt demonstrates RUE weakness and balance difficulties and can benefit from skilled OT.   Prior Therapy PT   Precautions   Precautions Fall   Balance Screen   Has the patient fallen in the past 6 months Yes   How many times? 2   Has the patient had a decrease in activity level because of a fear of falling?  Yes   Is  the patient reluctant to leave their home because of a fear of falling?  No   Home  Environment   Family/patient expects to be discharged to: Private residence   Living Arrangements Spouse/significant other   Available Help at Discharge Family   Type of [place] One level   Alternate Level Stairs - Number of Steps 5   Bathroom Shower/Tub Walk-in Shower   Lives With Spouse   Prior Function   Level of Independence Independent   Vocation Full time employment   Vocation Requirements Able to stand /walk throughout the day; functional use of right hand   ADL   Eating/Feeding Needs assist with cutting food  using left hand   Grooming Modified independent   Upper Body Bathing Moderate assistance   Lower Body Bathing Moderate assistance   Upper Body Dressing Minimal assistance   Lower Body Dressing Minimal assistance;Supervision/safety   Toilet Tranfer Supervision/safety   Tub/Shower Transfer Minimal assistance   Written Expression   Dominant Hand Right   Vision - History   Baseline Vision Bifocals   Vision Assessment   Vision Assessment Vision not tested  to be further assessed in functional context, denies change   Activity Tolerance   Activity Tolerance --  Standing x  30 mins then needs rest break   Cognition   Area of Impairment Memory;Problem solving   Current Attention Level Selective   Memory Decreased short-term memory   Problem Solving Slow processing;Difficulty sequencing;Decreased initiation;Requires verbal cues   Attention Selective   Sensation   Light Touch Appears Intact   Coordination   Gross Motor Movements are Fluid and Coordinated No   Fine Motor Movements are Fluid and Coordinated No  unable   Box and Blocks unable for RUE   Coordination unable to use RUE functionally   ROM / Strength   AROM / PROM / Strength AROM   AROM   Overall AROM  Deficits   Overall AROM Comments RUE: trace shoulder flexion, 25%elbow flexion/  extension, trace supination, pt demonstrates beginning flexor spasticity at elbow, no active wrist or finger movment noted       Treatment: Therapist educated pt/wife in tableslides for HEP, pt returned demonstration.                  OT Education - 12/08/14 1234    Education provided Yes   Education Details tableslides   Person(s) Educated Patient;Spouse   Methods Explanation;Demonstration;Verbal cues;Handout   Comprehension Verbalized understanding;Returned demonstration          OT Short Term Goals - 12/08/14 1257    OT SHORT TERM GOAL #1   Title I with HEP   Baseline 01/07/15   Time 4   Period Weeks   Status New   OT SHORT TERM GOAL #2   Title Pt will perform bathing with min A   Time 4   Period Weeks   Status New   OT SHORT TERM GOAL #3   Title Pt will perform dressing with supervision/ setup   Time 4   Period Weeks   Status New   OT SHORT TERM GOAL #4   Title Pt will use RUE as a stabilizer with minA   Time 4   Period Weeks   Status New   OT SHORT TERM GOAL #5   Title Pt will demonstrate 50% A/ROM elbow flexion/ extension.   Time 4   Period Weeks   Status New   Additional Short Term Goals   Additional Short Term Goals Yes   OT SHORT TERM GOAL #6   Title Pt will be modified independent with RUE positioning to minimize pain, risk for injury including splint wear PRN.   Time 4   Period Weeks   Status New           OT Long Term Goals - 12/08/14 1300    OT LONG TERM GOAL #1   Title Pt will use RUE as a stabilizer/ gross A for ADLs at least 25% of the time.   Baseline due 02/05/15   Time 8   Period Weeks   Status New   OT LONG TERM GOAL #2   Title Pt will demonstrate 30* shoulder flexion/ scaption in prep for functional reach.   Time 8   Period Weeks   Status New   OT LONG TERM GOAL #3   Title Pt will perform all basic ADLs with distant supervision.   Time 8   Period Weeks   Status New   OT LONG TERM GOAL #4   Title Pt will  perfom simple snack prep/ light home management with supervision.   Time 8   Period Weeks   Status New  Plan - 12/08/14 1218    Clinical Impression Statement Pt s/p CVA on 11/23/14 was d/c home on 11/24/14 and then had an extension of CVA with increased RUE weakness. Pt can benefit from skilled occupational therapy to address RUE weakness, cognitive deficits and balance in order to maximize independence with ADLs/IADLS.   Pt will benefit from skilled therapeutic intervention in order to improve on the following deficits (Retired) Abnormal gait;Decreased coordination;Decreased range of motion;Difficulty walking;Decreased endurance;Decreased activity tolerance;Decreased knowledge of precautions;Impaired tone;Pain;Impaired UE functional use;Decreased knowledge of use of DME;Decreased cognition;Decreased mobility;Decreased strength;Impaired perceived functional ability   Rehab Potential Good   OT Frequency 2x / week  plus eval   OT Duration 8 weeks   OT Treatment/Interventions Self-care/ADL training;Moist Heat;Fluidtherapy;DME and/or AE instruction;Patient/family education;Balance training;Therapeutic exercises;Contrast Bath;Ultrasound;Therapeutic exercise;Therapeutic activities;Cognitive remediation/compensation;Passive range of motion;Functional Mobility Training;Neuromuscular education;Cryotherapy;Electrical Stimulation;Parrafin;Energy conservation;Manual Therapy;Visual/perceptual remediation/compensation   Plan Neuro re-ed, progress HEP.   Consulted and Agree with Plan of Care Patient;Family member/caregiver   Family Member Consulted wife [name]        Problem List Patient Active Problem List   Diagnosis Date Noted  . HLD (hyperlipidemia)   . CVA (cerebral infarction) 11/23/2014  . Stroke 11/23/2014  . Hypertension 11/23/2014    RINE,KATHRYN 12/08/2014, 1:04 PM Theone Murdoch, OTR/L [phone] Phone: [phone] 1:04 PM 12/08/2014 [facility] [address] [place], [place], [address] Phone: [phone]   Fax:  [phone]

## 2014-12-08 NOTE — Patient Instructions (Signed)
Place hands on a towel on tabletop, slide towel forwards and backwards 10-20 reps 1x-2x day Repeat side to side 10-20 reps 1-2x day

## 2014-12-09 ENCOUNTER — Ambulatory Visit: Payer: BLUE CROSS/BLUE SHIELD | Admitting: Physical Therapy

## 2014-12-09 VITALS — BP 125/77 | HR 68

## 2014-12-09 DIAGNOSIS — R269 Unspecified abnormalities of gait and mobility: Secondary | ICD-10-CM

## 2014-12-09 DIAGNOSIS — I69922 Dysarthria following unspecified cerebrovascular disease: Secondary | ICD-10-CM | POA: Diagnosis not present

## 2014-12-09 DIAGNOSIS — IMO0002 Reserved for concepts with insufficient information to code with codable children: Secondary | ICD-10-CM

## 2014-12-09 NOTE — Therapy (Signed)
[facility] [address] [place], [place], [address] Phone: [phone]   Fax:  [phone]  Physical Therapy Treatment  Patient Details  Name: Phillip Curry MRN: [id number] Date of Birth: March 27, 1956 Referring Provider:  Josetta Huddle, MD  Encounter Date: 12/09/2014      PT End of Session - 12/09/14 1048    Visit Number 3   Number of Visits 20   PT Start Time 1000   PT Stop Time 1045   PT Time Calculation (min) 45 min   Equipment Utilized During Treatment Gait belt   Activity Tolerance Patient tolerated treatment well   Behavior During Therapy [facility] for tasks assessed/performed      Past Medical History  Diagnosis Date  . Hypertension   . Stroke     Past Surgical History  Procedure Laterality Date  . No past surgeries      Filed Vitals:   12/09/14 1024  BP: 125/77  Pulse: 68    Visit Diagnosis:  Weakness due to cerebrovascular accident  Lack of coordination due to stroke  Abnormality of gait      Subjective Assessment - 12/09/14 1024    Currently in Pain? No/denies       Neuromuscular re-ed  Star pattern stepping; vq's for slow controlled motion  (both legs); x 5 bouts--focus controlled single leg stance with weight shifting  Monster walk  30' to left and right  2 bouts; gait belt CGA Backward walking 30' x 2   Used pattern of line dance cupid shuffle;  4 steps right  4 left and 4 kicks in tempo  ; gait belt ;  Focus on rhythmic sequencing   Sit to stand ; note; focused on slow eccenctic phase; and gave manual weight bearing through the right UE throughout  Toe raise and heel raise increased difficulty with holding each position for 3-5 seconds                    [facility] Adult PT Treatment/Exercise - 12/09/14 0001    Transfers   Transfers --  slow eccectric phase; on hand support   Sit to Stand 5: Supervision   Sit to Stand Details Tactile cues for weight shifting   Sit  to Stand Details (indicate cue type and reason) --  manual weight bearing with sit to stand                PT Education - 12/09/14 1045    Education provided Yes   Education Details discussed fatigue with activity and that even though he may feel ok his neuro pathways need time to recover ; with activity he will see increase in foot drop and his fall risk increases; emphasized importance of  adequate rest to avoid fall    Person(s) Educated Patient;Spouse   Methods Explanation   Comprehension Verbalized understanding          PT Short Term Goals - 12/01/14 1001    PT SHORT TERM GOAL #1   Title Patient will demonstate safe techinque 10/10 trials with sit to/from stand transfers to various surfaces  Target date 12/31/14   Baseline supervision verbal ques; some impulsivity   Time 4   Period Weeks   Status New   PT SHORT TERM GOAL #2   Title Patient will demonstrate lowered fall risk evident by improved BERG to >45  Target date 12/31/14   Baseline 39   Time 4   Period Weeks   Status  New   PT SHORT TERM GOAL #3   Title Patient will be able to walk 1000' w/o loss of balance w/o use of assisted device clearing right foot with right stride.Target date 12/31/14   Baseline poor foot clearance; limited to 200-300 feet due to fatigue   Time 4   Status New           PT Long Term Goals - 12/01/14 1159    PT LONG TERM GOAL #1   Title Patient will demonstrated low fall risk evident by scoring >50 on BERG.  Target date 01/26/15   Baseline 39   Time 8   Period Weeks   Status New   PT LONG TERM GOAL #2   Title Patient will demonstrate ability to ambulate on varied terrain including curbs and steps independenlty w/o loss of balance and demo good safety awarness at all times for 30 minutes. Target date 01/26/15   Baseline limited by fatigue   Time 8   Period Weeks   Status New   PT LONG TERM GOAL #3   Title Functional gait will improve evident by being able to change direction,  speed  and move laterally and backward w/o loss of balance.Target date 01/26/15   Baseline limits movt to linear /forward movt; changing direction and speed are limited   Time 8   Period Weeks   PT LONG TERM GOAL #4   Title Patient will improve 15% on FOTO score to demo improved subjective perception of ability    Baseline 44   Time 8   Period Weeks   Status New               Plan - 12/09/14 1049    Clinical Impression Statement With fatigue right foot drop increases; increases fall risk; he tolerated today's ex's well;  vitals were taken after doing the monster walks (side stepping);  he is very postive at this point and working very hard to regain function-this is encouraging;  need to continue to emphasize safety so he wont  put himself in an unsafe position but he can tolerate higher level balance activies already;  expect him to make marked improvments in balance and gait ; discussed mirror therapy as well  for R UE ; his arm is completely flaccid at this stage        Problem List Patient Active Problem List   Diagnosis Date Noted  . HLD (hyperlipidemia)   . CVA (cerebral infarction) 11/23/2014  . Stroke 11/23/2014  . Hypertension 11/23/2014    Rosaura Carpenter D PT DPT 12/09/2014, 10:54 AM  [facility] [address] [place], [place], [address] Phone: [phone]   Fax:  [phone]

## 2014-12-12 ENCOUNTER — Ambulatory Visit: Payer: BLUE CROSS/BLUE SHIELD | Admitting: Occupational Therapy

## 2014-12-12 ENCOUNTER — Ambulatory Visit: Payer: BLUE CROSS/BLUE SHIELD

## 2014-12-12 DIAGNOSIS — I69319 Unspecified symptoms and signs involving cognitive functions following cerebral infarction: Secondary | ICD-10-CM

## 2014-12-12 DIAGNOSIS — IMO0002 Reserved for concepts with insufficient information to code with codable children: Secondary | ICD-10-CM

## 2014-12-12 DIAGNOSIS — G8191 Hemiplegia, unspecified affecting right dominant side: Secondary | ICD-10-CM

## 2014-12-12 DIAGNOSIS — I69922 Dysarthria following unspecified cerebrovascular disease: Secondary | ICD-10-CM | POA: Diagnosis not present

## 2014-12-12 DIAGNOSIS — I69322 Dysarthria following cerebral infarction: Secondary | ICD-10-CM

## 2014-12-12 NOTE — Patient Instructions (Signed)
  Please complete the exercises between sessions.

## 2014-12-12 NOTE — Therapy (Signed)
[facility] [address] [place], [place], [address] Phone: [phone]   Fax:  [phone]  Speech Language Pathology Treatment  Patient Details  Name: Phillip Curry MRN: [id number] Date of Birth: 04/05/1956 Referring Provider:  Josetta Huddle, MD  Encounter Date: 12/12/2014      End of Session - 12/12/14 1200    Visit Number 2   Number of Visits 16   Date for SLP Re-Evaluation 01/26/15   SLP Start Time 59   SLP Stop Time  1149   SLP Time Calculation (min) 44 min   Activity Tolerance Patient tolerated treatment well      Past Medical History  Diagnosis Date  . Hypertension   . Stroke     Past Surgical History  Procedure Laterality Date  . No past surgeries      There were no vitals filed for this visit.  Visit Diagnosis: Cognitive deficits following cerebral infarction  Dysarthria due to recent cerebrovascular accident      Subjective Assessment - 12/12/14 1113    Subjective Pt did not recall not taking Ceflaxen anymore. Had to look to wife to recall events of the weekend.   Patient is accompained by: Family member  wife, [name]               ADULT SLP TREATMENT - 12/12/14 1114    General Information   Behavior/Cognition Cooperative;Pleasant mood   Pain Assessment   Pain Assessment No/denies pain   Cognitive-Linquistic Treatment   Treatment focused on Dysarthria;Cognition   Skilled Treatment (speech therapy <30 min): SLP worked with pt on honing his HEP for dysarthria (muscle strengthening as well as speech intelligibility with words). Pt req'd mod A usually faded to occasionally, to overarticuate and stress consonant sounds in HEP.  In conversation between phrases/exercise, no carryover noted - likely partly due to decr'd cognitive-linguistic skills. (cognitive skills 17 minutes): Simple time problems answered with extra time consistently and 75% success intially. Increased to 100% with  SLP mod  to max cues.   Assessment / Recommendations / Plan   Plan Continue with current plan of care   Progression Toward Goals   Progression toward goals Progressing toward goals          SLP Education - 12/12/14 1159    Education provided Yes   Education Details compensations for dysarthria, home tasks   Person(s) Educated Patient;Spouse   Methods Explanation   Comprehension Verbalized understanding          SLP Short Term Goals - 12/12/14 1202    SLP SHORT TERM GOAL #1   Title Pt will perform dysarthria HEP with rare minimal assistance   Time 4   Period Weeks   Status On-going   SLP SHORT TERM GOAL #2   Title Pt will solve simple time, money, reasoning problems with 85% accuracy and occasional min A   Time 4   Period Weeks   Status On-going   SLP SHORT TERM GOAL #3   Title Pt will manage meds at home with compensations with rare min A over 3 sessions as reported by spouse/pt.   Time 4   Period Weeks   Status On-going          SLP Long Term Goals - 12/12/14 1202    SLP LONG TERM GOAL #1   Title Pt will be 95% intelligible in moderately complex conversation over 12 minutes   Time 8   Period Weeks   Status On-going  SLP LONG TERM GOAL #2   Title Pt will solve moderately complex time, money reasoning problems with 85% accuracy and rare min A   Time 8   Period Weeks   Status On-going   SLP LONG TERM GOAL #3   Title Pt will utilize compensations for schedule, daily chores, lists 2x during therapy session over 2 sessions with rare min A   Time 8   Period Weeks   Status On-going          Plan - 12/12/14 1200    Clinical Impression Statement Pt presents with cont dysarthria as well as cognitive linguistic deficts requiring skilled ST to cont to maximize pt's cognition and speech intelligilibty for increasing independence and possible return to work.   Speech Therapy Frequency 2x / week   Duration --  8 weeks   Treatment/Interventions Oral motor  exercises;Compensatory strategies;Patient/family education;Functional tasks;Cognitive reorganization;Internal/external aids;SLP instruction and feedback   Potential to Achieve Goals Good   Potential Considerations Ability to learn/carryover information        Problem List Patient Active Problem List   Diagnosis Date Noted  . HLD (hyperlipidemia)   . CVA (cerebral infarction) 11/23/2014  . Stroke 11/23/2014  . Hypertension 11/23/2014    [facility] , [place], [place]  12/12/2014, 12:03 PM  [facility] [address] [place], [place], [address] Phone: [phone]   Fax:  [phone]

## 2014-12-12 NOTE — Patient Instructions (Signed)
Flexion (Passive)   Sitting upright, slide forearm forward along table forwards/backwards with assist from Lt hand, bending from the waist until a stretch is felt (do NOT go as far as picture). Hold __5__ seconds. Then repeat side to side, and big circles clockwise Repeat _10___ times. Do __2__ sessions per day.  Flexion (Assistive)   Hold Rt wrist with Lt hand (Rt thumb side up) and raise arms above head, keeping elbows as straight as possible. Can be done sitting or lying. Repeat __2__ times. Do __2__ sessions per day.   CAREGIVER ASSISTED: Shoulder Abduction   Caregiver raises arm away from body, keeping one hand on top of shoulder. Patient looks at arm. Hold _5__ seconds. 10___ reps per set, _2__ sets per day  Flexion (Passive)   Use other hand to bend elbow, then straighten elbow with thumb toward same shoulder. Do NOT force this motion. Hold _5___ seconds. Repeat __10__ times. Do __2__ sessions per day.   SELF ASSISTED: Finger Flexion   Use one hand to close fingers on other hand, then open fingers. __10_ reps per set, _2__ sets per day     .

## 2014-12-12 NOTE — Therapy (Signed)
[facility] [address] [place], [place], [address] Phone: [phone]   Fax:  [phone]  Occupational Therapy Treatment  Patient Details  Name: Phillip Curry MRN: [id number] Date of Birth: Mar 26, 1956 Referring Provider:  Josetta Huddle, MD  Encounter Date: 12/12/2014      OT End of Session - 12/12/14 1321    Visit Number 2   Number of Visits 17   Date for OT Re-Evaluation 02/05/15   Authorization Type BCBS   Authorization - Number of Visits 40   OT Start Time 1145   OT Stop Time 1230   OT Time Calculation (min) 45 min   Activity Tolerance Patient tolerated treatment well      Past Medical History  Diagnosis Date  . Hypertension   . Stroke     Past Surgical History  Procedure Laterality Date  . No past surgeries      There were no vitals filed for this visit.  Visit Diagnosis:  Hemiplegia affecting right dominant side  Weakness due to cerebrovascular accident      Subjective Assessment - 12/12/14 1156    Subjective  Pt s/p CVA  11/23/14 presents with RUE weakness and decreased functional mobility.   Pertinent History see Epic snapshot   Patient Stated Goals to get better   Currently in Pain? No/denies                      OT Treatments/Exercises (OP) - 12/12/14 0001    ADLs   ADL Comments Bed positioning handout issued for Rt hemiplegia and discussed for proper positioning and to prevent future pain   Neurological Re-education Exercises   Other Exercises 1 Pt/wife issued initial HEP - see pt instructions. Pt return demo of each   Modalities   Modalities Retail buyer Location dorsal forearm   Electrical Stimulation Action wrist/finger extension   Electrical Stimulation Parameters 10 on/10 off cycle, 50 rate, 248 pw x 15 mintues for small muscle group   Electrical Stimulation Goals Neuromuscular facilitation                 OT Education - 12/12/14 1222    Education provided Yes   Education Details Initial self ROM and assisted ROM HEP   Person(s) Educated Patient;Spouse   Methods Explanation;Demonstration;Handout   Comprehension Verbalized understanding;Returned demonstration          OT Short Term Goals - 12/08/14 1257    OT SHORT TERM GOAL #1   Title I with HEP   Baseline 01/07/15   Time 4   Period Weeks   Status New   OT SHORT TERM GOAL #2   Title Pt will perform bathing with min A   Time 4   Period Weeks   Status New   OT SHORT TERM GOAL #3   Title Pt will perform dressing with supervision/ setup   Time 4   Period Weeks   Status New   OT SHORT TERM GOAL #4   Title Pt will use RUE as a stabilizer with minA   Time 4   Period Weeks   Status New   OT SHORT TERM GOAL #5   Title Pt will demonstrate 50% A/ROM elbow flexion/ extension.   Time 4   Period Weeks   Status New   Additional Short Term Goals   Additional Short Term Goals Yes   OT SHORT TERM GOAL #6   Title  Pt will be modified independent with RUE positioning to minimize pain, risk for injury including splint wear PRN.   Time 4   Period Weeks   Status New           OT Long Term Goals - 12/08/14 1300    OT LONG TERM GOAL #1   Title Pt will use RUE as a stabilizer/ gross A for ADLs at least 25% of the time.   Baseline due 02/05/15   Time 8   Period Weeks   Status New   OT LONG TERM GOAL #2   Title Pt will demonstrate 30* shoulder flexion/ scaption in prep for functional reach.   Time 8   Period Weeks   Status New   OT LONG TERM GOAL #3   Title Pt will perform all basic ADLs with distant supervision.   Time 8   Period Weeks   Status New   OT LONG TERM GOAL #4   Title Pt will perfom simple snack prep/ light home management with supervision.   Time 8   Period Weeks   Status New               Plan - 12/12/14 1322    Clinical Impression Statement Pt densely hemiplegic RUE with Rt  hand dominance. Pt approximating STG #1   Plan fabricate resting hand splint Rt, ? pre-fab D-ring for wrist support during day, estim, review HEP prn   OT Home Exercise Plan 12/12/14: Initial HEP and bed positioning handout   Consulted and Agree with Plan of Care Patient;Family member/caregiver   Family Member Consulted wife [name]        Problem List Patient Active Problem List   Diagnosis Date Noted  . HLD (hyperlipidemia)   . CVA (cerebral infarction) 11/23/2014  . Stroke 11/23/2014  . Hypertension 11/23/2014    Carey Bullocks, OTR/L 12/12/2014, 1:26 PM  [facility] [address] [place], [place], [address] Phone: [phone]   Fax:  [phone]

## 2014-12-14 ENCOUNTER — Encounter: Payer: Self-pay | Admitting: *Deleted

## 2014-12-14 ENCOUNTER — Ambulatory Visit: Payer: BLUE CROSS/BLUE SHIELD | Admitting: Physical Therapy

## 2014-12-14 ENCOUNTER — Ambulatory Visit: Payer: BLUE CROSS/BLUE SHIELD | Admitting: *Deleted

## 2014-12-14 ENCOUNTER — Encounter: Payer: Self-pay | Admitting: Physical Therapy

## 2014-12-14 ENCOUNTER — Ambulatory Visit: Payer: BLUE CROSS/BLUE SHIELD

## 2014-12-14 DIAGNOSIS — I69922 Dysarthria following unspecified cerebrovascular disease: Secondary | ICD-10-CM | POA: Diagnosis not present

## 2014-12-14 DIAGNOSIS — G8191 Hemiplegia, unspecified affecting right dominant side: Secondary | ICD-10-CM

## 2014-12-14 DIAGNOSIS — I69319 Unspecified symptoms and signs involving cognitive functions following cerebral infarction: Secondary | ICD-10-CM

## 2014-12-14 DIAGNOSIS — IMO0002 Reserved for concepts with insufficient information to code with codable children: Secondary | ICD-10-CM

## 2014-12-14 DIAGNOSIS — R269 Unspecified abnormalities of gait and mobility: Secondary | ICD-10-CM

## 2014-12-14 DIAGNOSIS — I69322 Dysarthria following cerebral infarction: Secondary | ICD-10-CM

## 2014-12-14 NOTE — Patient Instructions (Addendum)
Pt was fitted with custom fabricated resting hand splint today right and pre-fab wrist cock-up for better positioning of his right dominant hand. Splinting use, care and precautions were reviewed (pt/wife) and handout was provided. He will bring these splints to his next visit to check for independent don/doff and splint fit.

## 2014-12-14 NOTE — Patient Instructions (Signed)
Work on sentences with slow rate and over-pronounciation

## 2014-12-14 NOTE — Therapy (Signed)
[facility] [address] [place], [place], [address] Phone: [phone]   Fax:  [phone]  Occupational Therapy Treatment  Patient Details  Name: Phillip Curry MRN: [id number] Date of Birth: Oct 14, 1955 Referring Provider:  Josetta Huddle, MD  Encounter Date: 12/14/2014      OT End of Session - 12/14/14 1150    Visit Number 3   Number of Visits 17   Date for OT Re-Evaluation 02/05/15   Authorization Type BCBS   Authorization - Number of Visits 40   OT Start Time 0845   OT Stop Time 0930   OT Time Calculation (min) 45 min   Activity Tolerance Patient tolerated treatment well   Behavior During Therapy [facility] for tasks assessed/performed      Past Medical History  Diagnosis Date  . Hypertension   . Stroke     Past Surgical History  Procedure Laterality Date  . No past surgeries      There were no vitals filed for this visit.  Visit Diagnosis:  Hemiplegia affecting right dominant side  Lack of coordination due to stroke  Weakness due to cerebrovascular accident      Subjective Assessment - 12/14/14 0850    Subjective  Pt reports performing HEP as issued previous visit 2x/day, 10-15 reps per session.   Pertinent History see Epic snapshot   Patient Stated Goals to get better   Currently in Pain? No/denies                      OT Treatments/Exercises (OP) - 12/14/14 0001    Splinting   Splinting Pt was fitted with a custom fabricated resting hand splint RUE. Splinting use, care and precautions were reviewed w/ pt and his significant other today in the clinic & verbalized understanding of this. He will wear this splint in 15-30min increments working up to 2 hours then wearing at night. He was also fitted with a pre-fab wrist cock-up splint for positioning. Splint use, care and precautions were reviewed & he/caregiver verbalized understanding. He will wear the prefab splint during the day for  wrist support.     Pt was fitted with custom fabricated resting hand splint today right and pre-fab wrist cock-up for better positioning of his right dominant hand. Splinting use, care and precautions were reviewed (pt/wife) and handout was provided. He will bring these splints to his next visit to check for independent don/doff and splint fit.           OT Education - 12/14/14 1149    Education provided Yes   Methods Explanation;Demonstration;Handout   Comprehension Verbalized understanding;Returned demonstration          OT Short Term Goals - 12/08/14 1257    OT SHORT TERM GOAL #1   Title I with HEP   Baseline 01/07/15   Time 4   Period Weeks   Status New   OT SHORT TERM GOAL #2   Title Pt will perform bathing with min A   Time 4   Period Weeks   Status New   OT SHORT TERM GOAL #3   Title Pt will perform dressing with supervision/ setup   Time 4   Period Weeks   Status New   OT SHORT TERM GOAL #4   Title Pt will use RUE as a stabilizer with minA   Time 4   Period Weeks   Status New   OT SHORT TERM GOAL #5   Title Pt  will demonstrate 50% A/ROM elbow flexion/ extension.   Time 4   Period Weeks   Status New   Additional Short Term Goals   Additional Short Term Goals Yes   OT SHORT TERM GOAL #6   Title Pt will be modified independent with RUE positioning to minimize pain, risk for injury including splint wear PRN.   Time 4   Period Weeks   Status New           OT Long Term Goals - 12/08/14 1300    OT LONG TERM GOAL #1   Title Pt will use RUE as a stabilizer/ gross A for ADLs at least 25% of the time.   Baseline due 02/05/15   Time 8   Period Weeks   Status New   OT LONG TERM GOAL #2   Title Pt will demonstrate 30* shoulder flexion/ scaption in prep for functional reach.   Time 8   Period Weeks   Status New   OT LONG TERM GOAL #3   Title Pt will perform all basic ADLs with distant supervision.   Time 8   Period Weeks   Status New   OT LONG  TERM GOAL #4   Title Pt will perfom simple snack prep/ light home management with supervision.   Time 8   Period Weeks   Status New               Plan - 12/14/14 1151    Clinical Impression Statement Pt presents with hemiplegic RUE and is R HD. He should benefit from resting hand splint at noc and PRN use of pre-fab wrist support for positioning PRN throughout the day.    Plan Splint check; review HEPO & consider estim PRN   Consulted and Agree with Plan of Care Patient;Family member/caregiver   Family Member Consulted wife Phillip Curry        Problem List Patient Active Problem List   Diagnosis Date Noted  . HLD (hyperlipidemia)   . CVA (cerebral infarction) 11/23/2014  . Stroke 11/23/2014  . Hypertension 11/23/2014    Almyra Deforest, OTR/L 12/14/2014, 11:58 AM  [facility] [address] [place], [place], [address] Phone: [phone]   Fax:  [phone]

## 2014-12-14 NOTE — Patient Instructions (Addendum)
Terminal Knee Extension (Standing)   Facing anchor with right knee slightly bent and tubing just above knee, gently pull knee back straight. Do not overextend knee. Hold for 5 seconds. Repeat __10__ times per set. Do _2___ sets per session. Do __1-2__ sessions per day.  http://orth.exer.us/666   Copyright  VHI. All rights reserved.  Strengthening: Terminal Knee Extension (Supine)   With right knee over bolster/ball/rolled pillow, straighten knee by tightening muscles on top of thigh. Keep bottom of knee on bolster.Hold for 5 seconds. Use 1-2 pound ankle weight. Repeat __10__ times per set. Do __2__ sets per session. Do __1-2__ sessions per day.  http://orth.exer.us/626   Copyright  VHI. All rights reserved.  Straight Leg Raise   Tighten stomach and slowly raise locked right leg _3-4___ inches from floor. Hold in air for 5 seconds. Use 2# ankle weight. Repeat _10__ times per set. Do _1_ sets per session. Do _1-2___ sessions per day.  http://orth.exer.us/1102   Copyright  VHI. All rights reserved.  Strengthening: Hip Extension (Prone)   Tighten muscles on front of right thigh, then lift leg __2-3__ inches from surface, keeping knee locked. Hold for 5 seconds. Repeat _10___ times per set. Do _2___ sets per session. Do _1-2___ sessions per day.  http://orth.exer.us/620   Copyright  VHI. All rights reserved.  Hamstring Curl: Resisted (Prone)   NO BAND!, right leg straight, bend knee and SLOWLY lower foot back down to mat.  Repeat _10___ times per set. Do __1__ sets per session. Do _1-2___ sessions per day.  http://orth.exer.us/670   Copyright  VHI. All rights reserved.  EXTENSION: Prone - Knee Flexed (Active)   Lie on stomach, right knee bent to 90. Lift leg toward ceiling. NO WEIGHT AT THIS TIME. Slowly lower leg back down to mat. (he may need assistance to keep right knee bent in correct position) Complete _1-2__ sets of __10_ repetitions. Perform _1-2__ sessions  per day.  http://gtsc.exer.us/66   Copyright  VHI. All rights reserved.

## 2014-12-14 NOTE — Therapy (Signed)
[facility] [address] [place], [place], [address] Phone: [phone]   Fax:  [phone]  Speech Language Pathology Treatment  Patient Details  Name: Tyse Auriemma MRN: [id number] Date of Birth: Jan 20, 1956 Referring Provider:  Josetta Huddle, MD  Encounter Date: 12/14/2014      End of Session - 12/14/14 1149    Visit Number 3   Number of Visits 16   Date for SLP Re-Evaluation 01/26/15   SLP Start Time 1018   SLP Stop Time  1101   SLP Time Calculation (min) 43 min   Activity Tolerance Patient tolerated treatment well      Past Medical History  Diagnosis Date  . Hypertension   . Stroke     Past Surgical History  Procedure Laterality Date  . No past surgeries      There were no vitals filed for this visit.  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 Pt maintained slow rate approx 90% of the time (incr'd to 100% with rare min A, and pt with good but not excellent emergent awareness), and maintained overarticulation approx 70% of the time. Overarticulation improved to approx 85% with occasional min A from SLP.    Assessment / Recommendations / Plan   Plan Continue with current plan of care   Progression Toward Goals   Progression toward goals Progressing toward goals          SLP Education - 12/14/14 1148    Education provided Yes   Education  Details CVA ed, compensations for speech clarity          SLP Short Term Goals - 12/14/14 1155    SLP SHORT TERM GOAL #1   Title Pt will perform dysarthria HEP with rare minimal assistance   Time 4   Period Weeks   Status On-going   SLP SHORT TERM GOAL #2   Title Pt will solve simple time, money, reasoning problems with 85% accuracy and occasional min A   Time 4   Period Weeks   Status On-going   SLP SHORT TERM GOAL #3   Title Pt will manage meds at home with compensations with rare min A over 3 sessions as reported by spouse/pt.   Time 4   Period Weeks   Status On-going          SLP Long Term Goals - 12/14/14 1155    SLP LONG TERM GOAL #1   Title Pt will be 95% intelligible in moderately complex conversation over 12 minutes   Time 8   Period Weeks   Status On-going   SLP LONG TERM GOAL #2   Title Pt will solve moderately complex time, money reasoning problems with 85% accuracy and rare min A   Time 8   Period Weeks   Status On-going   SLP LONG TERM GOAL #3   Title Pt will utilize compensations for schedule, daily  chores, lists 2x during therapy session over 2 sessions with rare min A   Time 8   Period Weeks   Status On-going          Plan - 12/14/14 1152    Clinical Impression Statement Pt presents with cont dysarthria as well as cognitive linguistic deficts requiring skilled ST to cont to maximize pt's cognition and speech intelligilibty for increasing independence and possible return to work.   Speech Therapy Frequency 2x / week   Duration --  8 weeks   Treatment/Interventions Oral motor exercises;Compensatory strategies;Patient/family education;Functional tasks;Cognitive reorganization;Internal/external aids;SLP instruction and feedback   Potential to Achieve Goals Good   Potential Considerations Ability to learn/carryover information        Problem List Patient Active Problem List   Diagnosis Date Noted  . HLD (hyperlipidemia)   . CVA (cerebral  infarction) 11/23/2014  . Stroke 11/23/2014  . Hypertension 11/23/2014    [facility] , [place], [place]  12/14/2014, 11:56 AM  [facility] [address] [place], [place], [address] Phone: [phone]   Fax:  [phone]

## 2014-12-14 NOTE — Therapy (Signed)
[facility] [address] [place], [place], [address] Phone: [phone]   Fax:  [phone]  Physical Therapy Treatment  Patient Details  Name: Phillip Curry MRN: [id number] Date of Birth: 24-Nov-1955 Referring Provider:  Josetta Huddle, MD  Encounter Date: 12/14/2014      PT End of Session - 12/14/14 0808    Visit Number 4   Number of Visits 20   PT Start Time 0803   PT Stop Time 0845   PT Time Calculation (min) 42 min   Equipment Utilized During Treatment Gait belt   Activity Tolerance Patient tolerated treatment well   Behavior During Therapy [facility] for tasks assessed/performed      Past Medical History  Diagnosis Date  . Hypertension   . Stroke     Past Surgical History  Procedure Laterality Date  . No past surgeries      There were no vitals filed for this visit.  Visit Diagnosis:  Lack of coordination due to stroke  Abnormality of gait  Weakness due to cerebrovascular accident  Hemiplegia affecting right dominant side      Subjective Assessment - 12/14/14 0807    Subjective No new complaints. No falls or pain to report.   Currently in Pain? No/denies   Pain Score 0-No pain     Exercises terminal knee extension with red band,5 sec holds,2 sets of 10 reps(with UE support for balance) Short arc quads with 2# ankle weights,2 sets of 10 reps  straight leg raises 2# ankle weights,2 sets of 10 reps Prone  - right hamstring curls x 10 reps with assist for form - hip extension leg raises, 2 sets of 10 reps - glut kick backs x 10 reps with assist for form   Neuro Re-ed:  Sit<>stands x 10 each with emphasis on right leg weight bearing, decreasing left leg assist -with left foot on 4 inch box -with left foot on 6inch box         PT Education - 12/14/14 1257    Education provided Yes   Education Details HEP: exercises for right leg strengthening   Person(s) Educated Patient;Spouse   Methods  Explanation;Demonstration;Handout   Comprehension Verbalized understanding;Returned demonstration;Verbal cues required;Tactile cues required          PT Short Term Goals - 12/01/14 1001    PT SHORT TERM GOAL #1   Title Patient will demonstate safe techinque 10/10 trials with sit to/from stand transfers to various surfaces  Target date 12/31/14   Baseline supervision verbal ques; some impulsivity   Time 4   Period Weeks   Status New   PT SHORT TERM GOAL #2   Title Patient will demonstrate lowered fall risk evident by improved BERG to >45  Target date 12/31/14   Baseline 39   Time 4   Period Weeks   Status New   PT SHORT TERM GOAL #3   Title Patient will be able to walk 1000' w/o loss of balance w/o use of assisted device clearing right foot with right stride.Target date 12/31/14   Baseline poor foot clearance; limited to 200-300 feet due to fatigue   Time 4   Status New           PT Long Term Goals - 12/01/14 1159    PT LONG TERM GOAL #1   Title Patient will demonstrated low fall risk evident by scoring >50 on BERG.  Target date 01/26/15   Baseline 39   Time 8  Period Weeks   Status New   PT LONG TERM GOAL #2   Title Patient will demonstrate ability to ambulate on varied terrain including curbs and steps independenlty w/o loss of balance and demo good safety awarness at all times for 30 minutes. Target date 01/26/15   Baseline limited by fatigue   Time 8   Period Weeks   Status New   PT LONG TERM GOAL #3   Title Functional gait will improve evident by being able to change direction, speed  and move laterally and backward w/o loss of balance.Target date 01/26/15   Baseline limits movt to linear /forward movt; changing direction and speed are limited   Time 8   Period Weeks   PT LONG TERM GOAL #4   Title Patient will improve 15% on FOTO score to demo improved subjective perception of ability    Baseline 44   Time 8   Period Weeks   Status New            Plan -  12/14/14 0808    Clinical Impression Statement Focued on right leg strengthening today with pt reporting fatigue afterwards. Issued exercises for HEP for right leg strengthening today. Pt making steady progress toward goals.   Pt will benefit from skilled therapeutic intervention in order to improve on the following deficits Abnormal gait;Decreased coordination;Difficulty walking;Impaired tone;Impaired UE functional use;Decreased endurance;Decreased activity tolerance;Decreased knowledge of precautions;Impaired perceived functional ability;Decreased balance;Decreased mobility;Decreased strength   Clinical Impairments Affecting Rehab Potential current dense paresis of right UE   PT Frequency 2x / week   PT Duration 8 weeks   PT Treatment/Interventions Gait training;Neuromuscular re-education;Balance training;Therapeutic exercise;Therapeutic activities;Functional mobility training;Patient/family education;Manual techniques;Vestibular;Stair training   PT Next Visit Plan Review HEP,  R LE strength and forced weight shifting, R knee control (mid stance phase), ankle DF control, use AFO as needed for reciprocal gait practice        Problem List Patient Active Problem List   Diagnosis Date Noted  . HLD (hyperlipidemia)   . CVA (cerebral infarction) 11/23/2014  . Stroke 11/23/2014  . Hypertension 11/23/2014    Willow Ora 12/14/2014, 1:02 PM  Willow Ora, PTA, [facility] [address] [place], [place] [address] [phone] 12/14/2014, 1:05 PM

## 2014-12-15 ENCOUNTER — Encounter: Payer: BLUE CROSS/BLUE SHIELD | Admitting: Speech Pathology

## 2014-12-15 ENCOUNTER — Ambulatory Visit: Payer: BLUE CROSS/BLUE SHIELD | Admitting: Occupational Therapy

## 2014-12-16 ENCOUNTER — Ambulatory Visit: Payer: BLUE CROSS/BLUE SHIELD | Admitting: Occupational Therapy

## 2014-12-16 ENCOUNTER — Encounter: Payer: Self-pay | Admitting: Physical Therapy

## 2014-12-16 ENCOUNTER — Ambulatory Visit: Payer: BLUE CROSS/BLUE SHIELD | Admitting: Physical Therapy

## 2014-12-16 DIAGNOSIS — R269 Unspecified abnormalities of gait and mobility: Secondary | ICD-10-CM

## 2014-12-16 DIAGNOSIS — G8191 Hemiplegia, unspecified affecting right dominant side: Secondary | ICD-10-CM

## 2014-12-16 DIAGNOSIS — IMO0002 Reserved for concepts with insufficient information to code with codable children: Secondary | ICD-10-CM

## 2014-12-16 DIAGNOSIS — I69922 Dysarthria following unspecified cerebrovascular disease: Secondary | ICD-10-CM | POA: Diagnosis not present

## 2014-12-16 NOTE — Therapy (Signed)
[facility] [address] [place], [place], [address] Phone: [phone]   Fax:  [phone]  Physical Therapy Treatment  Patient Details  Name: Phillip Curry MRN: [id number] Date of Birth: May 13, 1955 Referring Provider:  Josetta Huddle, MD  Encounter Date: 12/16/2014   12/16/14 0849  PT Visits / Re-Eval  Visit Number 5  Number of Visits 20  PT Time Calculation  PT Start Time 0846  PT Stop Time 0930  PT Time Calculation (min) 44 min  PT - End of Session  Equipment Utilized During Treatment Gait belt  Activity Tolerance Patient tolerated treatment well  Behavior During Therapy [facility] for tasks assessed/performed     Past Medical History  Diagnosis Date  . Hypertension   . Stroke     Past Surgical History  Procedure Laterality Date  . No past surgeries      There were no vitals filed for this visit.  Visit Diagnosis:   Weakness due to cerebrovascular accident   438.89 I69.898 ... Change Dx 2.  Abnormality of gait   781.2 R26.9 Change Dx 3.  Lack of coordination due to stroke   438.89 ... I69.80 ... Change Dx 4.  Hemiplegia affecting right dominant side         Subjective Assessment - 12/16/14 0848    Subjective No new complaints. Sore after last session. Has been doing HEP at home with spouse assist "yes, twice yesterday:.   Currently in Pain? No/denies   Pain Score 0-No pain     Treatment: Gait 630 feet with min guard assist, no AD. Cues for posture, increased right hip/knee/DF with swing phase to clear the foot with gait. Cues on increased stride length and gait speed as well.  Exercises -right foot taps to bottom 3 stairs, up<>down with tap to each. Cues to lift leg/foot for taps, not slide to advance it. 10 reps with left UE support for balance. Cues on posture and ex form, assist due to posterior lean toward end when trying to lift leg.  Hip flexion off/onto mat with cues on correct ex  form/technique, 2 sets of 10 reps  With right leg on 8 inch box, left leg straight: pushing through right leg for hip extension/partial bridge, 2 sets of 10 reps  Neuro re-ed: Blue foam beam Alternating forward heel taps and backward toe taps x 10 each  bil legs with intermittent UE support on the parallel bars  Sit<>stand With left foot on 4 inch box x 10 reps with min guard assist  With both feet on balance board in lateral direction x 10 reps with min guard assist        PT Short Term Goals - 12/01/14 1001    PT SHORT TERM GOAL #1   Title Patient will demonstate safe techinque 10/10 trials with sit to/from stand transfers to various surfaces  Target date 12/31/14   Baseline supervision verbal ques; some impulsivity   Time 4   Period Weeks   Status New   PT SHORT TERM GOAL #2   Title Patient will demonstrate lowered fall risk evident by improved BERG to >45  Target date 12/31/14   Baseline 39   Time 4   Period Weeks   Status New   PT SHORT TERM GOAL #3   Title Patient will be able to walk 1000' w/o loss of balance w/o use of assisted device clearing right foot with right stride.Target date 12/31/14   Baseline poor foot clearance; limited to  200-300 feet due to fatigue   Time 4   Status New           PT Long Term Goals - 12/01/14 1159    PT LONG TERM GOAL #1   Title Patient will demonstrated low fall risk evident by scoring >50 on BERG.  Target date 01/26/15   Baseline 39   Time 8   Period Weeks   Status New   PT LONG TERM GOAL #2   Title Patient will demonstrate ability to ambulate on varied terrain including curbs and steps independenlty w/o loss of balance and demo good safety awarness at all times for 30 minutes. Target date 01/26/15   Baseline limited by fatigue   Time 8   Period Weeks   Status New   PT LONG TERM GOAL #3   Title Functional gait will improve evident by being able to change direction, speed  and move laterally and backward w/o loss of  balance.Target date 01/26/15   Baseline limits movt to linear /forward movt; changing direction and speed are limited   Time 8   Period Weeks   PT LONG TERM GOAL #4   Title Patient will improve 15% on FOTO score to demo improved subjective perception of ability    Baseline 44   Time 8   Period Weeks   Status New        12/16/14 0849  Plan  Clinical Impression Statement Pt making steady progress toward golals.   Pt will benefit from skilled therapeutic intervention in order to improve on the following deficits Abnormal gait;Decreased coordination;Difficulty walking;Impaired tone;Impaired UE functional use;Decreased endurance;Decreased activity tolerance;Decreased knowledge of precautions;Impaired perceived functional ability;Decreased balance;Decreased mobility;Decreased strength  Clinical Impairments Affecting Rehab Potential current dense paresis of right UE  PT Frequency 2x / week  PT Duration 8 weeks  PT Treatment/Interventions Gait training;Neuromuscular re-education;Balance training;Therapeutic exercise;Therapeutic activities;Functional mobility training;Patient/family education;Manual techniques;Vestibular;Stair training  PT Next Visit Plan  R LE strength and forced weight shifting, R knee control (mid stance phase), ankle DF control, use AFO as needed for reciprocal gait practice      Problem List Patient Active Problem List   Diagnosis Date Noted  . HLD (hyperlipidemia)   . CVA (cerebral infarction) 11/23/2014  . Stroke 11/23/2014  . Hypertension 11/23/2014    Willow Ora 12/16/2014, 8:50 AM  Willow Ora, PTA, [facility] [address] [place], [place] [address] [phone] 12/16/2014, 8:50 AM

## 2014-12-16 NOTE — Therapy (Signed)
[facility] [address] [place], [place], [address] Phone: [phone]   Fax:  [phone]  Occupational Therapy Treatment  Patient Details  Name: Phillip Curry MRN: [id number] Date of Birth: 1956-04-13 Referring Provider:  Josetta Huddle, MD  Encounter Date: 12/16/2014      OT End of Session - 12/16/14 1126    Visit Number 4   Date for OT Re-Evaluation 02/05/15   Authorization Type [employer]   Authorization - Number of Visits 40   OT Start Time 1109   OT Stop Time 1148   OT Time Calculation (min) 39 min   Activity Tolerance Patient tolerated treatment well   Behavior During Therapy [facility] for tasks assessed/performed      Past Medical History  Diagnosis Date  . Hypertension   . Stroke     Past Surgical History  Procedure Laterality Date  . No past surgeries      There were no vitals filed for this visit.  Visit Diagnosis:  Hemiplegia affecting right dominant side  Lack of coordination due to stroke  Weakness due to cerebrovascular accident      Subjective Assessment - 12/16/14 1131    Subjective  Pt reports splint is doing well   Pertinent History see Epic snapshot   Patient Stated Goals to get better   Currently in Pain? No/denies                      OT Treatments/Exercises (OP) - 12/16/14 0001    Neurological Re-education Exercises   Other Exercises 1 Reviewed previsously issued inital HEP with pt/ wife. They returned demonstration.   Modalities   Modalities [employer] Location dorsal forearm   Electrical Stimulation Action wrist and fingers, then triceps   Electrical Stimulation Parameters 50 pps, 250 pw, 10 secs cycle, 2 sec ramp, 8 mins for wrist then 7 mins to elbow with therapist/ patient facilitating AA/ROM elbow entension during on cycle while estim to triceps. intensity :wrist 33, triceps 26 Pt demonstrates  beginneing elbow flexion/ extension A/ROM and trace finger extension in index finger at end of session.   Electrical Stimulation Goals Neuromuscular facilitation   Splinting   Splinting Pt's wife reports splint is doing well with no problems. Pt. did not bring resting hand splint to therapy today.                  OT Short Term Goals - 12/08/14 1257    OT SHORT TERM GOAL #1   Title I with HEP   Baseline 01/07/15   Time 4   Period Weeks   Status New   OT SHORT TERM GOAL #2   Title Pt will perform bathing with min A   Time 4   Period Weeks   Status New   OT SHORT TERM GOAL #3   Title Pt will perform dressing with supervision/ setup   Time 4   Period Weeks   Status New   OT SHORT TERM GOAL #4   Title Pt will use RUE as a stabilizer with minA   Time 4   Period Weeks   Status New   OT SHORT TERM GOAL #5   Title Pt will demonstrate 50% A/ROM elbow flexion/ extension.   Time 4   Period Weeks   Status New   Additional Short Term Goals   Additional Short Term Goals Yes   OT SHORT TERM GOAL #6  Title Pt will be modified independent with RUE positioning to minimize pain, risk for injury including splint wear PRN.   Time 4   Period Weeks   Status New           OT Long Term Goals - 12/08/14 1300    OT LONG TERM GOAL #1   Title Pt will use RUE as a stabilizer/ gross A for ADLs at least 25% of the time.   Baseline due 02/05/15   Time 8   Period Weeks   Status New   OT LONG TERM GOAL #2   Title Pt will demonstrate 30* shoulder flexion/ scaption in prep for functional reach.   Time 8   Period Weeks   Status New   OT LONG TERM GOAL #3   Title Pt will perform all basic ADLs with distant supervision.   Time 8   Period Weeks   Status New   OT LONG TERM GOAL #4   Title Pt will perfom simple snack prep/ light home management with supervision.   Time 8   Period Weeks   Status New               Plan - 12/16/14 1110    Clinical Impression Statement Pt  is progressing towards goals. Pt/ wife report no problems with resting hand splint at home. Pt slept in splint last night.   Pt will benefit from skilled therapeutic intervention in order to improve on the following deficits (Retired) Abnormal gait;Decreased coordination;Decreased range of motion;Difficulty walking;Decreased endurance;Decreased activity tolerance;Decreased knowledge of precautions;Impaired tone;Pain;Impaired UE functional use;Decreased knowledge of use of DME;Decreased cognition;Decreased mobility;Decreased strength;Impaired perceived functional ability   Rehab Potential Good   OT Frequency 2x / week   OT Duration 8 weeks   OT Treatment/Interventions Self-care/ADL training;Moist Heat;Fluidtherapy;DME and/or AE instruction;Patient/family education;Balance training;Therapeutic exercises;Contrast Bath;Ultrasound;Therapeutic exercise;Therapeutic activities;Cognitive remediation/compensation;Passive range of motion;Functional Mobility Training;Neuromuscular education;Cryotherapy;Electrical Stimulation;Parrafin;Energy conservation;Manual Therapy;Visual/perceptual remediation/compensation   Plan neuro re-ed, estim   OT Home Exercise Plan 12/12/14: Initial HEP and bed positioning handout   Consulted and Agree with Plan of Care Patient;Family member/caregiver   Family Member Consulted wife [name]        Problem List Patient Active Problem List   Diagnosis Date Noted  . HLD (hyperlipidemia)   . CVA (cerebral infarction) 11/23/2014  . Stroke 11/23/2014  . Hypertension 11/23/2014    RINE,KATHRYN 12/16/2014, 1:10 PM  [facility] [address] [place], [place], [address] Phone: [phone]   Fax:  [phone]

## 2014-12-19 ENCOUNTER — Telehealth: Payer: Self-pay

## 2014-12-19 NOTE — Telephone Encounter (Signed)
LFt message for patient to return phone call. Pt needs to schedule an appt in one month per Dr.Xu.

## 2014-12-20 ENCOUNTER — Ambulatory Visit: Payer: BLUE CROSS/BLUE SHIELD

## 2014-12-20 ENCOUNTER — Ambulatory Visit: Payer: BLUE CROSS/BLUE SHIELD | Admitting: Occupational Therapy

## 2014-12-20 ENCOUNTER — Ambulatory Visit: Payer: BLUE CROSS/BLUE SHIELD | Admitting: Speech Pathology

## 2014-12-20 DIAGNOSIS — IMO0002 Reserved for concepts with insufficient information to code with codable children: Secondary | ICD-10-CM

## 2014-12-20 DIAGNOSIS — G8191 Hemiplegia, unspecified affecting right dominant side: Secondary | ICD-10-CM

## 2014-12-20 DIAGNOSIS — I69319 Unspecified symptoms and signs involving cognitive functions following cerebral infarction: Secondary | ICD-10-CM

## 2014-12-20 DIAGNOSIS — I69922 Dysarthria following unspecified cerebrovascular disease: Secondary | ICD-10-CM | POA: Diagnosis not present

## 2014-12-20 DIAGNOSIS — R269 Unspecified abnormalities of gait and mobility: Secondary | ICD-10-CM

## 2014-12-20 DIAGNOSIS — I69322 Dysarthria following cerebral infarction: Secondary | ICD-10-CM

## 2014-12-20 NOTE — Therapy (Signed)
[facility] [address] [place], [place], [address] Phone: [phone]   Fax:  [phone]  Occupational Therapy Treatment  Patient Details  Name: Phillip Curry MRN: [id number] Date of Birth: [birth year] Referring Provider:  Josetta Huddle, MD  Encounter Date: 12/20/2014      OT End of Session - 12/20/14 1310    Visit Number 5   Number of Visits 17   Date for OT Re-Evaluation 02/05/15   Authorization Type BCBS   Authorization - Number of Visits 40   OT Start Time 1150   OT Stop Time 1230   OT Time Calculation (min) 40 min   Activity Tolerance Patient tolerated treatment well      Past Medical History  Diagnosis Date  . Hypertension   . Stroke     Past Surgical History  Procedure Laterality Date  . No past surgeries      There were no vitals filed for this visit.  Visit Diagnosis:  Hemiplegia affecting right dominant side  Lack of coordination due to stroke  Weakness due to cerebrovascular accident      Subjective Assessment - 12/20/14 1303    Subjective  I can put on my socks and pants, but struggle with button of pants   Patient is accompained by: Family member   Pertinent History see Epic snapshot   Patient Stated Goals to get better   Currently in Pain? No/denies                      OT Treatments/Exercises (OP) - 12/20/14 0001    ADLs   UB Dressing Practiced donning/doffing shirt with min cues for hemi dressing techniques. Pt donned/doffed independently after cueing   LB Dressing Pt practiced hooking/unhooking belt and button on pants with extra time and difficulty but independently. Pt/family also shown shoe buttons and demo use. Pt able to don/doff shoe with shoe button. Pt/family provided handout on different options for tying shoes (shoe buttons, elastic shoelaces, velcro, etc) and how/where to purchase. Discussed rocker knife as well for cutting food and options for bathing  with hemi techniques.    Neurological Re-education Exercises   Other Exercises 1 closed chain body on arm movements maintaining approx. 70* shoulder flexion closed chain RUE with min v.c's and tactile cues   Other Exercises 2 AA/ROM low range sh. flexion and elbow extension with UE Ranger and mod assist for elbow extension and control. Pt now demo wrist extension against gravity and 10% finger flexion             Balance Exercises - 12/20/14 1229    Balance Exercises: Standing   Step Ups 4 inch;6 inch  no UE support   Other Standing Exercises x10/activity.  4" and 6" step ups, step downs with 6" step, dot taps with heels and cone taps with 6" cones. Cues for techniqeue and x10 without looking at cones to improve propriception of R LE. Cues to improve weight shifting to R LE.              OT Short Term Goals - 12/20/14 1310    OT SHORT TERM GOAL #1   Title I with HEP   Baseline 01/07/15   Time 4   Period Weeks   Status Achieved   OT SHORT TERM GOAL #2   Title Pt will perform bathing with min A   Time 4   Period Weeks   Status On-going   OT SHORT  TERM GOAL #3   Title Pt will perform dressing with supervision/ setup   Time 4   Period Weeks   Status On-going   OT SHORT TERM GOAL #4   Title Pt will use RUE as a stabilizer with minA   Time 4   Period Weeks   Status New   OT SHORT TERM GOAL #5   Title Pt will demonstrate 50% A/ROM elbow flexion/ extension.   Time 4   Period Weeks   Status New   OT SHORT TERM GOAL #6   Title Pt will be modified independent with RUE positioning to minimize pain, risk for injury including splint wear PRN.   Time 4   Period Weeks   Status Achieved           OT Long Term Goals - 12/08/14 1300    OT LONG TERM GOAL #1   Title Pt will use RUE as a stabilizer/ gross A for ADLs at least 25% of the time.   Baseline due 02/05/15   Time 8   Period Weeks   Status New   OT LONG TERM GOAL #2   Title Pt will demonstrate 30* shoulder  flexion/ scaption in prep for functional reach.   Time 8   Period Weeks   Status New   OT LONG TERM GOAL #3   Title Pt will perform all basic ADLs with distant supervision.   Time 8   Period Weeks   Status New   OT LONG TERM GOAL #4   Title Pt will perfom simple snack prep/ light home management with supervision.   Time 8   Period Weeks   Status New               Plan - 12/20/14 1311    Clinical Impression Statement Pt met STG's #1 and #6. Approximating STG's #2 and #3. Pt with new movement in wrist extension and finger flexion   Plan neuro re-ed, estim   OT Home Exercise Plan 12/12/14: Initial HEP and bed positioning handout   Consulted and Agree with Plan of Care Patient;Family member/caregiver   Family Member Consulted wife [name]        Problem List Patient Active Problem List   Diagnosis Date Noted  . HLD (hyperlipidemia)   . CVA (cerebral infarction) 11/23/2014  . Stroke 11/23/2014  . Hypertension 11/23/2014    Carey Bullocks, OTR/L 12/20/2014, 1:13 PM  [facility] [address] [place], [place], [address] Phone: [phone]   Fax:  [phone]

## 2014-12-20 NOTE — Therapy (Signed)
[facility] [address] [place], [place], [address] Phone: [phone]   Fax:  [phone]  Speech Language Pathology Treatment  Patient Details  Name: Phillip Curry MRN: [id number] Date of Birth: [birth year] Referring Provider:  Josetta Huddle, MD  Encounter Date: 12/20/2014      End of Session - 12/20/14 1151    Visit Number 4   Number of Visits 24   Date for SLP Re-Evaluation 01/26/15   SLP Start Time 1017   SLP Stop Time  1100   SLP Time Calculation (min) 43 min   Activity Tolerance Patient tolerated treatment well      Past Medical History  Diagnosis Date  . Hypertension   . Stroke     Past Surgical History  Procedure Laterality Date  . No past surgeries      There were no vitals filed for this visit.  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 I am supposed to talk slow and big - open my mouth"   Patient is accompained by: Family member               ADULT SLP TREATMENT - 12/20/14 1023    General Information   Behavior/Cognition Pleasant mood;Cooperative;Requires cueing   Pain Assessment   Pain Assessment No/denies pain   Cognitive-Linquistic Treatment   Treatment focused on Dysarthria;Cognition   Skilled Treatment Simple money/time word problems with extended time, pt generated written cues and rare min A., 90% accurate.  Compensations for dysarthtria facilitated during structured speech tasks with rare min A  generating multiple meaning sentences. Speech compensations adequate, however pt required extended time and semantic cues to identify 2nd meaning of simple words.    Assessment / Recommendations / Plan   Plan Continue with current plan of care   Progression Toward Goals   Progression toward goals  Progressing toward goals          SLP Education - 12/20/14 1057    Education provided Yes   Education Details compensations for dysarthria, cognitive activties to do at home   Person(s) Educated Patient;Spouse   Methods Explanation;Demonstration;Handout   Comprehension Verbalized understanding;Returned demonstration;Verbal cues required;Tactile cues required          SLP Short Term Goals - 12/20/14 1151    SLP SHORT TERM GOAL #1   Title Pt will perform dysarthria HEP with rare minimal assistance   Time 3   Period Weeks   Status On-going   SLP SHORT TERM GOAL #2   Title Pt will solve simple time, money, reasoning problems with 85% accuracy and occasional min A   Time 3   Period Weeks   Status On-going   SLP SHORT TERM GOAL #3   Title Pt will manage meds at home with compensations with rare min A over 3 sessions as reported by spouse/pt.   Time 3   Period Weeks   Status On-going          SLP Long Term Goals - 12/20/14 1151    SLP LONG TERM GOAL #1   Title Pt will be 95% intelligible in moderately complex conversation over 12 minutes   Time 7   Period Weeks   Status On-going   SLP LONG TERM GOAL #2   Title Pt will solve moderately complex time, money reasoning  problems with 85% accuracy and rare min A   Time 7   Period Weeks   Status On-going   SLP LONG TERM GOAL #3   Title Pt will utilize compensations for schedule, daily chores, lists 2x during therapy session over 2 sessions with rare min A   Time 7   Period Weeks   Status On-going          Plan - 12/20/14 1146    Clinical Impression Statement Pt required extended time consistently and min to mod A for simple time, money problem solving. Pt required min A for dysarthria compensations during structured speech tasks. Continue skilled ST to maximize cognition and intelligibility.   Speech Therapy Frequency 2x / week   Treatment/Interventions Oral motor exercises;Compensatory strategies;Patient/family  education;Functional tasks;Cognitive reorganization;Internal/external aids;SLP instruction and feedback   Potential to Achieve Goals Good   Potential Considerations Ability to learn/carryover information   Consulted and Agree with Plan of Care Patient;Family member/caregiver   Family Member Consulted spouse        Problem List Patient Active Problem List   Diagnosis Date Noted  . HLD (hyperlipidemia)   . CVA (cerebral infarction) 11/23/2014  . Stroke 11/23/2014  . Hypertension 11/23/2014    Jamyla Ard, Annye Rusk MS, CCC-SLP 12/20/2014, 11:52 AM  [facility] [address] [place], [place], [address] Phone: [phone]   Fax:  [phone]

## 2014-12-20 NOTE — Therapy (Signed)
[facility] [address] [place], [place], [address] Phone: [phone]   Fax:  [phone]  Physical Therapy Treatment  Patient Details  Name: Charlis Harner MRN: [id number] Date of Birth: 03-29-1956 Referring Provider:  Josetta Huddle, MD  Encounter Date: 12/20/2014      PT End of Session - 12/20/14 1231    Visit Number 6   Number of Visits 20   Date for PT Re-Evaluation 02/03/15   Authorization Type BCBS   PT Start Time [phone]   PT Stop Time 1012   PT Time Calculation (min) 41 min   Equipment Utilized During Treatment Gait belt   Activity Tolerance Patient tolerated treatment well   Behavior During Therapy [facility] for tasks assessed/performed      Past Medical History  Diagnosis Date  . Hypertension   . Stroke     Past Surgical History  Procedure Laterality Date  . No past surgeries      There were no vitals filed for this visit.  Visit Diagnosis:  Abnormality of gait  Hemiplegia affecting right dominant side  Lack of coordination due to stroke      Subjective Assessment - 12/20/14 0934    Subjective Pt denied falls or changes since last visit.   Patient is accompained by: Family member   Pertinent History relatively healthy man prior to CVA on 11/23/14   Patient Stated Goals get using my right hand good again , walking and talking better   Currently in Pain? No/denies                         [facility] Adult PT Treatment/Exercise - 12/20/14 0934    Ambulation/Gait   Ambulation/Gait Yes   Ambulation/Gait Assistance 5: Supervision;4: Min guard   Ambulation/Gait Assistance Details Pt ambulated with and without R foot up brace donned, with improved toe clearance with foot up brace donned. Cues to improve stride length and heel strike. Pt noted to amb. with R foot adducted, which caused decreased BOS.   Ambulation Distance (Feet) 600 Feet  outdoors, 117'x2 with brace indoors, and 117'no brace  indoor   Assistive device None   Gait Pattern Step-through pattern;Decreased arm swing - right;Decreased dorsiflexion - right;Right hip hike;Lateral trunk lean to left;Decreased trunk rotation;Poor foot clearance - right  intermittent bouts of R foot adducted   Ambulation Surface Unlevel;Level;Paved;Grass;Other (comment);Outdoor;Indoor  rubber mulch             Balance Exercises - 12/20/14 1229    Balance Exercises: Standing   Step Ups 4 inch;6 inch  no UE support   Other Standing Exercises x10/activity.  4" and 6" step ups, step downs with 6" step, dot taps with heels and cone taps with 6" cones. Cues for techniqeue and x10 without looking at cones to improve propriception of R LE. Cues to improve weight shifting to R LE.            PT Education - 12/20/14 1230    Education provided Yes   Education Details PT provided pt with [place]ur foot up brace information, as pt ambulated with improved R toe clearance with foot up brace donned. PT explained that the foot up is a less restrictive device, and that pt would benefit from using it for ambulating longer distances, as R toe clearance decreases with longer distances.   Person(s) Educated Patient;Spouse   Methods Explanation;Handout;Verbal cues;Demonstration   Comprehension Verbalized understanding;Returned demonstration  PT Short Term Goals - 12/20/14 1238    PT SHORT TERM GOAL #1   Title Patient will demonstate safe techinque 10/10 trials with sit to/from stand transfers to various surfaces  Target date 12/31/14   Baseline supervision verbal ques; some impulsivity   Time 4   Period Weeks   Status On-going   PT SHORT TERM GOAL #2   Title Patient will demonstrate lowered fall risk evident by improved BERG to >45  Target date 12/31/14   Baseline 39   Time 4   Period Weeks   Status On-going   PT SHORT TERM GOAL #3   Title Patient will be able to walk 1000' w/o loss of balance w/o use of assisted device clearing right  foot with right stride.Target date 12/31/14   Baseline poor foot clearance; limited to 200-300 feet due to fatigue   Time 4   Status On-going           PT Long Term Goals - 12/20/14 1238    PT LONG TERM GOAL #1   Title Patient will demonstrated low fall risk evident by scoring >50 on BERG.  Target date 01/26/15   Baseline 39   Time 8   Period Weeks   Status On-going   PT LONG TERM GOAL #2   Title Patient will demonstrate ability to ambulate on varied terrain including curbs and steps independenlty w/o loss of balance and demo good safety awarness at all times for 30 minutes. Target date 01/26/15   Baseline limited by fatigue   Time 8   Period Weeks   Status On-going   PT LONG TERM GOAL #3   Title Functional gait will improve evident by being able to change direction, speed  and move laterally and backward w/o loss of balance.Target date 01/26/15   Baseline limits movt to linear /forward movt; changing direction and speed are limited   Time 8   Period Weeks   Status On-going   PT LONG TERM GOAL #4   Title Patient will improve 15% on FOTO score to demo improved subjective perception of ability    Baseline 44   Time 8   Period Weeks   Status On-going               Plan - 12/20/14 1232    Clinical Impression Statement Pt demonstrated improved R toe clearance with R foot up brace donned. Pt continues to require cues and increased time to allow for cognitive processing during activities. Pt would continue to benefit from skilled PT to improve safety during functional mobility.   Pt will benefit from skilled therapeutic intervention in order to improve on the following deficits Abnormal gait;Decreased coordination;Difficulty walking;Impaired tone;Impaired UE functional use;Decreased endurance;Decreased activity tolerance;Decreased knowledge of precautions;Impaired perceived functional ability;Decreased balance;Decreased mobility;Decreased strength   Rehab Potential Good    Clinical Impairments Affecting Rehab Potential current dense paresis of right UE   PT Frequency 2x / week   PT Duration 8 weeks   PT Treatment/Interventions Gait training;Neuromuscular re-education;Balance training;Therapeutic exercise;Therapeutic activities;Functional mobility training;Patient/family education;Manual techniques;Vestibular;Stair training   PT Next Visit Plan sit<>stand from lower/compliant surfaces, R LE strength training (R knee and DF control)   Consulted and Agree with Plan of Care Patient   Family Member Consulted spouse-[name]        Problem List Patient Active Problem List   Diagnosis Date Noted  . HLD (hyperlipidemia)   . CVA (cerebral infarction) 11/23/2014  . Stroke 11/23/2014  . Hypertension 11/23/2014  Miller,Jennifer L 12/20/2014, 12:39 PM  [facility] [address] [place], [place], [address] Phone: [phone]   Fax:  [phone]     Geoffry Paradise, PT,DPT 12/20/2014 12:39 PM Phone: [phone] Fax: [phone]

## 2014-12-22 ENCOUNTER — Encounter: Payer: Self-pay | Admitting: Occupational Therapy

## 2014-12-22 ENCOUNTER — Ambulatory Visit: Payer: BLUE CROSS/BLUE SHIELD | Attending: Internal Medicine | Admitting: Occupational Therapy

## 2014-12-22 ENCOUNTER — Ambulatory Visit: Payer: BLUE CROSS/BLUE SHIELD

## 2014-12-22 ENCOUNTER — Encounter: Payer: BLUE CROSS/BLUE SHIELD | Admitting: Occupational Therapy

## 2014-12-22 DIAGNOSIS — I6980 Unspecified sequelae of other cerebrovascular disease: Secondary | ICD-10-CM | POA: Insufficient documentation

## 2014-12-22 DIAGNOSIS — R269 Unspecified abnormalities of gait and mobility: Secondary | ICD-10-CM | POA: Diagnosis present

## 2014-12-22 DIAGNOSIS — G8191 Hemiplegia, unspecified affecting right dominant side: Secondary | ICD-10-CM | POA: Insufficient documentation

## 2014-12-22 DIAGNOSIS — R531 Weakness: Secondary | ICD-10-CM | POA: Insufficient documentation

## 2014-12-22 DIAGNOSIS — R41841 Cognitive communication deficit: Secondary | ICD-10-CM | POA: Insufficient documentation

## 2014-12-22 DIAGNOSIS — IMO0002 Reserved for concepts with insufficient information to code with codable children: Secondary | ICD-10-CM

## 2014-12-22 DIAGNOSIS — I6931 Cognitive deficits following cerebral infarction: Secondary | ICD-10-CM | POA: Insufficient documentation

## 2014-12-22 DIAGNOSIS — R279 Unspecified lack of coordination: Secondary | ICD-10-CM | POA: Diagnosis present

## 2014-12-22 DIAGNOSIS — I69322 Dysarthria following cerebral infarction: Secondary | ICD-10-CM

## 2014-12-22 DIAGNOSIS — I69898 Other sequelae of other cerebrovascular disease: Secondary | ICD-10-CM | POA: Insufficient documentation

## 2014-12-22 DIAGNOSIS — I69922 Dysarthria following unspecified cerebrovascular disease: Secondary | ICD-10-CM | POA: Insufficient documentation

## 2014-12-22 DIAGNOSIS — I69319 Unspecified symptoms and signs involving cognitive functions following cerebral infarction: Secondary | ICD-10-CM

## 2014-12-22 NOTE — Therapy (Signed)
[facility] [address] [place], [place], [address] Phone: [phone]   Fax:  [phone]  Physical Therapy Treatment  Patient Details  Name: Phillip Curry MRN: [id number] Date of Birth: Dec 06, 1955 Referring Provider:  Josetta Huddle, MD  Encounter Date: 12/22/2014      PT End of Session - 12/22/14 1141    Visit Number 7   Number of Visits 20   Date for PT Re-Evaluation 02/03/15   Authorization Type BCBS   PT Start Time 0932   PT Stop Time 1013   PT Time Calculation (min) 41 min   Equipment Utilized During Treatment Gait belt   Activity Tolerance Patient tolerated treatment well   Behavior During Therapy [facility] for tasks assessed/performed      Past Medical History  Diagnosis Date  . Hypertension   . Stroke     Past Surgical History  Procedure Laterality Date  . No past surgeries      There were no vitals filed for this visit.  Visit Diagnosis:  Weakness due to cerebrovascular accident  Abnormality of gait      Subjective Assessment - 12/22/14 0935    Subjective Pt denied falls or changes since last visit.    Patient is accompained by: Family member   Pertinent History relatively healthy man prior to CVA on 11/23/14   Patient Stated Goals get using my right hand good again , walking and talking better   Currently in Pain? No/denies                         [facility] Adult PT Treatment/Exercise - 12/22/14 0001    Transfers   Transfers Sit to Stand;Stand to Sit   Sit to Stand 5: Supervision;4: Min guard;Without upper extremity assist;Other (comment)  mat, and stool    Sit to Stand Details Tactile cues for weight shifting;Verbal cues for technique;Verbal cues for sequencing   Sit to Stand Details (indicate cue type and reason) Pt performed x15 reps seated on mat with 2" step placed under L LE to improve weight shifting onto R LE. x10 from wheeled stool with min guard x5 on stool with airex  placed on stool. Supervision to/from mat. Cues to improve ant. weight shifting and weight shifting onto R LE.   Stand to Sit 5: Supervision;4: Min guard   Stand to Sit Details (indicate cue type and reason) Verbal cues for technique   Stand to Sit Details Cues to improve eccentric control. x15 from mat, x10 on stool and x5 on stool with airex pad placed on stool. Supervision on mat and min guard on stool .    Ambulation/Gait   Ambulation/Gait Yes   Ambulation/Gait Assistance 5: Supervision   Ambulation/Gait Assistance Details Pt ambulated on track and was noted to experience R genu recurvatum.    Ambulation Distance (Feet) 115 Feet   Assistive device None   Gait Pattern Step-through pattern;Decreased arm swing - right;Decreased dorsiflexion - right;Right hip hike;Lateral trunk lean to left;Decreased trunk rotation;Right genu recurvatum  intermittent R genu recurvatum   Ambulation Surface Level;Indoor       Therex: Standing: -Mini-squat/sidestepping with red theraband placed above knees: 3x5reps/direction. -Mini-wall squat with 5 seconds hold x10, with 2" step placed under L LE to improve weight shifting to R LE. -Leg press with R LE only: 40 pounds, 3x10 reps.  Cues and demonstration for technique and to improve eccentric control.  PT Short Term Goals - 12/20/14 1238    PT SHORT TERM GOAL #1   Title Patient will demonstate safe techinque 10/10 trials with sit to/from stand transfers to various surfaces  Target date 12/31/14   Baseline supervision verbal ques; some impulsivity   Time 4   Period Weeks   Status On-going   PT SHORT TERM GOAL #2   Title Patient will demonstrate lowered fall risk evident by improved BERG to >45  Target date 12/31/14   Baseline 39   Time 4   Period Weeks   Status On-going   PT SHORT TERM GOAL #3   Title Patient will be able to walk 1000' w/o loss of balance w/o use of assisted device clearing right foot with right stride.Target date  12/31/14   Baseline poor foot clearance; limited to 200-300 feet due to fatigue   Time 4   Status On-going           PT Long Term Goals - 12/20/14 1238    PT LONG TERM GOAL #1   Title Patient will demonstrated low fall risk evident by scoring >50 on BERG.  Target date 01/26/15   Baseline 39   Time 8   Period Weeks   Status On-going   PT LONG TERM GOAL #2   Title Patient will demonstrate ability to ambulate on varied terrain including curbs and steps independenlty w/o loss of balance and demo good safety awarness at all times for 30 minutes. Target date 01/26/15   Baseline limited by fatigue   Time 8   Period Weeks   Status On-going   PT LONG TERM GOAL #3   Title Functional gait will improve evident by being able to change direction, speed  and move laterally and backward w/o loss of balance.Target date 01/26/15   Baseline limits movt to linear /forward movt; changing direction and speed are limited   Time 8   Period Weeks   Status On-going   PT LONG TERM GOAL #4   Title Patient will improve 15% on FOTO score to demo improved subjective perception of ability    Baseline 44   Time 8   Period Weeks   Status On-going               Plan - 12/22/14 1141    Clinical Impression Statement Pt demonstrated decreased R genu recurvatum with tactile and VC's during amb. Pt continues to require cues to improve R knee control during amb and would benefit from continued strengthening to improve control. Pt continues to require increased time for processing during all activities. Continue with POC.   Pt will benefit from skilled therapeutic intervention in order to improve on the following deficits Abnormal gait;Decreased coordination;Difficulty walking;Impaired tone;Impaired UE functional use;Decreased endurance;Decreased activity tolerance;Decreased knowledge of precautions;Impaired perceived functional ability;Decreased balance;Decreased mobility;Decreased strength   Rehab Potential Good    Clinical Impairments Affecting Rehab Potential current dense paresis of right UE   PT Frequency 2x / week   PT Duration 8 weeks   PT Treatment/Interventions Gait training;Neuromuscular re-education;Balance training;Therapeutic exercise;Therapeutic activities;Functional mobility training;Patient/family education;Manual techniques;Vestibular;Stair training   PT Next Visit Plan Begin to assess STGs.   Consulted and Agree with Plan of Care Patient   Family Member Consulted spouse-[name]        Problem List Patient Active Problem List   Diagnosis Date Noted  . HLD (hyperlipidemia)   . CVA (cerebral infarction) 11/23/2014  . Stroke 11/23/2014  . Hypertension 11/23/2014    Nathaly Dawkins L  12/22/2014, 11:45 AM  [facility] [address] [place], [place], [address] Phone: [phone]   Fax:  [phone]     Geoffry Paradise, PT,DPT 12/22/2014 11:45 AM Phone: [phone] Fax: [phone]

## 2014-12-22 NOTE — Patient Instructions (Signed)
Speech Exercises  Do 2 times, 3 times a day  SLOW and BIG! Open your mouth, exaggerate  Call the cat "Buttercup" A calendar of [place], [place] Four floors to cover Yellow oil ointment Fellow lovers of felines Catastrophe in [place]' plums The church's chimes chimed Telling time 'til eleven Five valve levers Keep the gate closed Go see that guy Fat cows give milk [employer] Gophers Fat frogs flip freely Kohl's into bed Get that game to Greg Thick thistles stick together Cinnamon aluminum linoleum Black bugs blood Lovely lemon linament Red leather, yellow leather  Big grocery buggy    Purple baby carriage [employer] Proper copper coffee pot Ripe purple cabbage Three free throws [employer] playing baseball [place] dipped the dessert  [place] that [employer] of [employer] Shirts shrink, shells shouldn't [place] 49ers Take the tackle box File the flash message Give me five flapjacks Fundamental relatives Dye the pets purple Talking [place] time after time Dark chocolate chunks Political landscape of the kingdom Estate manager/land agent genius We played yo-yos yesterday

## 2014-12-22 NOTE — Therapy (Signed)
[facility] [address] [place], [place], [address] Phone: [phone]   Fax:  [phone]  Occupational Therapy Treatment  Patient Details  Name: Phillip Curry MRN: [id number] Date of Birth: 1955-12-26 Referring Provider:  Josetta Huddle, MD  Encounter Date: 12/22/2014      OT End of Session - 12/22/14 1112    Visit Number 6   Number of Visits 17   Date for OT Re-Evaluation 02/05/15   Authorization Type BCBS   Authorization - Number of Visits 40   OT Start Time 1015   OT Stop Time 1100   OT Time Calculation (min) 45 min   Activity Tolerance Patient tolerated treatment well      Past Medical History  Diagnosis Date  . Hypertension   . Stroke     Past Surgical History  Procedure Laterality Date  . No past surgeries      There were no vitals filed for this visit.  Visit Diagnosis:  Hemiplegia affecting right dominant side  Weakness due to cerebrovascular accident      Subjective Assessment - 12/22/14 1017    Subjective  I got my shirt on by myself this morning   Patient is accompained by: Family member  Phillip   Pertinent History see Epic snapshot   Patient Stated Goals to get better   Currently in Pain? No/denies                      OT Treatments/Exercises (OP) - 12/22/14 1105    Neurological Re-education Exercises   Thumb Opposition Neuro re ed to address RUE reach with with emphasis on closed chain activity first with unilateral RUE in heaving weight bearing with forward reach and resistance in sitting - pt requires to assist to maintain elbow extension in this position. In supine addressed bilateral reach in closed chain with full shoulder flexion arc and place and hold activities with both closed hand and open hand - pt requires mod vc's to maintain elbow extension due to flexor synergy influence however is able to achieve elbow extension with cues only. Pt also instructed to use  vision to compensate for impaired balance. In sitting closed chain bilateral low reach with emphasis on movement patters out of synergy patterns - pt requires mod facilitation.  Pt with improved ability to activate RUE with moveable surfaces due to sensory impairment.                   OT Short Term Goals - 12/22/14 1110    OT SHORT TERM GOAL #1   Title I with HEP   Baseline 01/07/15   Time 4   Period Weeks   Status Achieved   OT SHORT TERM GOAL #2   Title Pt will perform bathing with min A   Time 4   Period Weeks   Status On-going   OT SHORT TERM GOAL #3   Title Pt will perform dressing with supervision/ setup   Time 4   Period Weeks   Status On-going   OT SHORT TERM GOAL #4   Title Pt will use RUE as a stabilizer with minA   Time 4   Period Weeks   Status New   OT SHORT TERM GOAL #5   Title Pt will demonstrate 50% A/ROM elbow flexion/ extension.   Time 4   Period Weeks   Status New   OT SHORT TERM GOAL #6   Title Pt will be modified  independent with RUE positioning to minimize pain, risk for injury including splint wear PRN.   Time 4   Period Weeks   Status Achieved           OT Long Term Goals - 12/22/14 1110    OT LONG TERM GOAL #1   Title Pt will use RUE as a stabilizer/ gross A for ADLs at least 25% of the time.   Baseline due 02/05/15   Time 8   Period Weeks   Status New   OT LONG TERM GOAL #2   Title Pt will demonstrate 30* shoulder flexion/ scaption in prep for functional reach.   Time 8   Period Weeks   Status New   OT LONG TERM GOAL #3   Title Pt will perform all basic ADLs with distant supervision.   Time 8   Period Weeks   Status New   OT LONG TERM GOAL #4   Title Pt will perfom simple snack prep/ light home management with supervision.   Time 8   Period Weeks   Status New               Plan - 12/22/14 1110    Clinical Impression Statement Pt making good progress toward goals. Pt reports he was able to don his shirt  independently today.  Pt with continued improvement in ability to move RUE out of synergy patterns for low bilateral reach   Pt will benefit from skilled therapeutic intervention in order to improve on the following deficits (Retired) Abnormal gait;Decreased coordination;Decreased range of motion;Difficulty walking;Decreased endurance;Decreased activity tolerance;Decreased knowledge of precautions;Impaired tone;Pain;Impaired UE functional use;Decreased knowledge of use of DME;Decreased cognition;Decreased mobility;Decreased strength;Impaired perceived functional ability   Rehab Potential Good   OT Frequency 2x / week   OT Duration 8 weeks   OT Treatment/Interventions Self-care/ADL training;Moist Heat;Fluidtherapy;DME and/or AE instruction;Patient/family education;Balance training;Therapeutic exercises;Contrast Bath;Ultrasound;Therapeutic exercise;Therapeutic activities;Cognitive remediation/compensation;Passive range of motion;Functional Mobility Training;Neuromuscular education;Cryotherapy;Electrical Stimulation;Parrafin;Energy conservation;Manual Therapy;Visual/perceptual remediation/compensation   Plan NMR RUE, address further ADL issues   OT Home Exercise Plan 12/12/14: Initial HEP and bed positioning handout   Consulted and Agree with Plan of Care Patient;Family member/caregiver   Family Member Consulted Phillip Curry        Problem List Patient Active Problem List   Diagnosis Date Noted  . HLD (hyperlipidemia)   . CVA (cerebral infarction) 11/23/2014  . Stroke 11/23/2014  . Hypertension 11/23/2014    Quay Burow, OTR/L 12/22/2014, 11:13 AM  [facility] [address] [place], [place], [address] Phone: [phone]   Fax:  [phone]

## 2014-12-22 NOTE — Therapy (Signed)
[facility] [address] [place], [place], [address] Phone: [phone]   Fax:  [phone]  Speech Language Pathology Treatment  Patient Details  Name: Phillip Curry MRN: [id number] Date of Birth: 07/22/1955 Referring Provider:  Josetta Huddle, MD  Encounter Date: 12/22/2014      End of Session - 12/22/14 0908    Visit Number 5   Number of Visits 24   Date for SLP Re-Evaluation 01/26/15   SLP Start Time 0803   SLP Stop Time  0846   SLP Time Calculation (min) 43 min   Activity Tolerance Patient tolerated treatment well      Past Medical History  Diagnosis Date  . Hypertension   . Stroke     Past Surgical History  Procedure Laterality Date  . No past surgeries      There were no vitals filed for this visit.  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               ADULT SLP TREATMENT - 12/22/14 0832    General Information   Behavior/Cognition Pleasant mood;Cooperative;Requires cueing   Pain Assessment   Pain Assessment No/denies pain   Cognitive-Linquistic Treatment   Treatment focused on Dysarthria   Skilled Treatment Pt's HEP reviewed with usual mod cues initially, faded to rare min A. SLP added "tongue twisters" to pt's HEP for muscle strength and also practice for compensations of slow and precise speech.   Assessment / Recommendations / Plan   Plan Continue with current plan of care   Progression Toward Goals   Progression toward goals Progressing toward goals          SLP Education - 12/22/14 0907    Education provided Yes   Education Details HEP   Person(s) Educated Patient;Spouse   Methods Explanation;Demonstration;Verbal cues   Comprehension Verbalized understanding;Returned demonstration;Verbal cues  required;Need further instruction          SLP Short Term Goals - 12/20/14 1151    SLP SHORT TERM GOAL #1   Title Pt will perform dysarthria HEP with rare minimal assistance   Time 3   Period Weeks   Status On-going   SLP SHORT TERM GOAL #2   Title Pt will solve simple time, money, reasoning problems with 85% accuracy and occasional min A   Time 3   Period Weeks   Status On-going   SLP SHORT TERM GOAL #3   Title Pt will manage meds at home with compensations with rare min A over 3 sessions as reported by spouse/pt.   Time 3   Period Weeks   Status On-going          SLP Long Term Goals - 12/20/14 1151    SLP LONG TERM GOAL #1   Title Pt will be 95% intelligible in moderately complex conversation over 12 minutes   Time 7   Period Weeks   Status On-going   SLP LONG TERM GOAL #2   Title Pt will solve moderately complex time, money reasoning problems with 85% accuracy and rare min A   Time 7   Period Weeks   Status On-going   SLP LONG TERM GOAL #3   Title Pt will utilize compensations for schedule, daily chores, lists 2x during therapy session over 2 sessions with rare min A  Time 7   Period Weeks   Status On-going          Plan - 12/22/14 0908    Clinical Impression Statement Pt eventually required min A rarely for HEP practice (initially usual cues). Continue skilled ST to maximize cognition and intelligibility.   Speech Therapy Frequency 2x / week   Duration --  7 weeks   Treatment/Interventions Oral motor exercises;Compensatory strategies;Patient/family education;Functional tasks;Cognitive reorganization;Internal/external aids;SLP instruction and feedback   Potential to Achieve Goals Good        Problem List Patient Active Problem List   Diagnosis Date Noted  . HLD (hyperlipidemia)   . CVA (cerebral infarction) 11/23/2014  . Stroke 11/23/2014  . Hypertension 11/23/2014    [facility] , [place], [place]  12/22/2014, 9:10 AM  [facility] [address] [place], [place], [address] Phone: [phone]   Fax:  [phone]

## 2014-12-23 ENCOUNTER — Ambulatory Visit: Payer: BLUE CROSS/BLUE SHIELD

## 2014-12-27 ENCOUNTER — Ambulatory Visit: Payer: BLUE CROSS/BLUE SHIELD

## 2014-12-27 ENCOUNTER — Ambulatory Visit: Payer: BLUE CROSS/BLUE SHIELD | Admitting: Speech Pathology

## 2014-12-27 ENCOUNTER — Encounter: Payer: Self-pay | Admitting: Occupational Therapy

## 2014-12-27 ENCOUNTER — Ambulatory Visit: Payer: BLUE CROSS/BLUE SHIELD | Admitting: Occupational Therapy

## 2014-12-27 DIAGNOSIS — G8191 Hemiplegia, unspecified affecting right dominant side: Secondary | ICD-10-CM

## 2014-12-27 DIAGNOSIS — I69322 Dysarthria following cerebral infarction: Secondary | ICD-10-CM

## 2014-12-27 DIAGNOSIS — IMO0002 Reserved for concepts with insufficient information to code with codable children: Secondary | ICD-10-CM

## 2014-12-27 DIAGNOSIS — R41841 Cognitive communication deficit: Secondary | ICD-10-CM

## 2014-12-27 DIAGNOSIS — R269 Unspecified abnormalities of gait and mobility: Secondary | ICD-10-CM

## 2014-12-27 NOTE — Therapy (Signed)
[facility] [address] [place], [place], [address] Phone: [phone]   Fax:  [phone]  Occupational Therapy Treatment  Patient Details  Name: Phillip Curry MRN: [id number] Date of Birth: February 15, 1956 Referring Provider:  Josetta Huddle, MD  Encounter Date: 12/27/2014      OT End of Session - 12/27/14 1314    Visit Number 7   Number of Visits 17   Date for OT Re-Evaluation 02/05/15   Authorization Type BCBS   Authorization - Number of Visits 40   OT Start Time 1100   OT Stop Time 1145   OT Time Calculation (min) 45 min   Equipment Utilized During Treatment estim   Activity Tolerance Patient tolerated treatment well      Past Medical History  Diagnosis Date  . Hypertension   . Stroke     Past Surgical History  Procedure Laterality Date  . No past surgeries      There were no vitals filed for this visit.  Visit Diagnosis:  Weakness due to cerebrovascular accident  Hemiplegia affecting right dominant side      Subjective Assessment - 12/27/14 1111    Patient is accompained by: Family member  wife   Pertinent History see Epic snapshot   Patient Stated Goals to get better   Currently in Pain? No/denies                      OT Treatments/Exercises (OP) - 12/27/14 0001    ADLs   ADL Comments Assessed STG's and progress to date. (see STG's)   Neurological Re-education Exercises   Other Exercises 1 Closed chain RUE activation to maintain approx. 70-80* shoulder flexion with elbow extension for body on arm movements. Progressed to AA/ROM for low range shoulder flexion with elbow extension using UE Ranger and min facilitation for elbow extension and control    Other Exercises 2 Supine: AA/ROM with PVC frame for chest press, and shoulder flexion with min assist for technique, control and maintaining hand placement. A/ROM against gravity for elbow extension with min facilitation   Other  Weight-Bearing Exercises 1 Seated: bridging over RUE x 10 with min cues to perform correctly   Acupuncturist Stimulation Location dorsal forearm   Electrical Stimulation Action wrist/finger extension   Electrical Stimulation Parameters 10 on/10 off cycle, 50 rate, 248 pw, x 15 minutes at end of session   Electrical Stimulation Goals Neuromuscular facilitation                  OT Short Term Goals - 12/27/14 1145    OT SHORT TERM GOAL #1   Title I with HEP   Baseline 01/07/15   Time 4   Period Weeks   Status Achieved   OT SHORT TERM GOAL #2   Title Pt will perform bathing with min A   Time 4   Period Weeks   Status Achieved   OT SHORT TERM GOAL #3   Title Pt will perform dressing with supervision/ setup   Time 4   Period Weeks   Status Achieved  except tying shoes (pt leaves tied. O.T. shown alternative options/AE)   OT SHORT TERM GOAL #4   Title Pt will use RUE as a stabilizer with minA   Time 4   Period Weeks   Status On-going   OT SHORT TERM GOAL #5   Title Pt will demonstrate 50% A/ROM elbow flexion/ extension.   Time 4  Period Weeks   Status Achieved   OT SHORT TERM GOAL #6   Title Pt will be modified independent with RUE positioning to minimize pain, risk for injury including splint wear PRN.   Time 4   Period Weeks   Status Achieved           OT Long Term Goals - 12/22/14 1110    OT LONG TERM GOAL #1   Title Pt will use RUE as a stabilizer/ gross A for ADLs at least 25% of the time.   Baseline due 02/05/15   Time 8   Period Weeks   Status New   OT LONG TERM GOAL #2   Title Pt will demonstrate 30* shoulder flexion/ scaption in prep for functional reach.   Time 8   Period Weeks   Status New   OT LONG TERM GOAL #3   Title Pt will perform all basic ADLs with distant supervision.   Time 8   Period Weeks   Status New   OT LONG TERM GOAL #4   Title Pt will perfom simple snack prep/ light home management with supervision.    Time 8   Period Weeks   Status New               Plan - 12/27/14 1314    Clinical Impression Statement Pt met STG's #2, 3, and 5 today. STG #4 ongoing. Pt continues to make progress RUE in closed chain and AA/ROM activities. Pt demo approx. 30-40% finger flexion, and trace to 10% finger extension. Pt also continues to improve active elbow flex/extension.    Plan continue NMR RUE, continue towards STG #4.    OT Home Exercise Plan 12/12/14: Initial HEP and bed positioning handout, A/E recommendations.    Consulted and Agree with Plan of Care Patient;Family member/caregiver   Family Member Consulted wife [name]        Problem List Patient Active Problem List   Diagnosis Date Noted  . HLD (hyperlipidemia)   . CVA (cerebral infarction) 11/23/2014  . Stroke 11/23/2014  . Hypertension 11/23/2014    Carey Bullocks, OTR/L 12/27/2014, 1:17 PM  [facility] [address] [place], [place], [address] Phone: [phone]   Fax:  [phone]

## 2014-12-27 NOTE — Patient Instructions (Addendum)
Homework provided Continue HEP for dysarthria Wear analog watch

## 2014-12-27 NOTE — Therapy (Signed)
[facility] [address] [place], [place], [address] Phone: [phone]   Fax:  [phone]  Physical Therapy Treatment  Patient Details  Name: Phillip Curry MRN: [id number] Date of Birth: 1955/06/20 Referring Provider:  Josetta Huddle, MD  Encounter Date: 12/27/2014      PT End of Session - 12/27/14 1231    Visit Number 8   Number of Visits 20   Date for PT Re-Evaluation 02/03/15   Authorization Type BCBS   PT Start Time 1146   PT Stop Time 1227   PT Time Calculation (min) 41 min   Equipment Utilized During Treatment Gait belt   Activity Tolerance Patient tolerated treatment well   Behavior During Therapy [facility] for tasks assessed/performed      Past Medical History  Diagnosis Date  . Hypertension   . Stroke     Past Surgical History  Procedure Laterality Date  . No past surgeries      There were no vitals filed for this visit.  Visit Diagnosis:  Abnormality of gait  Weakness due to cerebrovascular accident  Lack of coordination due to stroke      Subjective Assessment - 12/27/14 1148    Subjective Pt denied falls or changes since last session.    Patient is accompained by: Family member  [name]-wife   Patient Stated Goals get using my right hand good again , walking and talking better   Currently in Pain? No/denies                         [facility] Adult PT Treatment/Exercise - 12/27/14 1150    Transfers   Transfers Sit to Stand;Stand to Sit   Sit to Stand 7: Independent   Sit to Stand Details (indicate cue type and reason) Pt demonstrated proper technique and did no LOB episodes.   Stand to Sit 7: Independent   Stand to Sit Details Demonstrated safe and proper technique.   Ambulation/Gait   Ambulation/Gait Yes   Ambulation/Gait Assistance 5: Supervision   Ambulation/Gait Assistance Details Cues to improve R heel strike and weight shifting while traversing incline/decline.   Ambulation Distance (Feet) 1000 Feet   Assistive device None   Gait Pattern Step-through pattern;Decreased arm swing - right;Decreased dorsiflexion - right;Right hip hike;Lateral trunk lean to left;Decreased trunk rotation;Right genu recurvatum   Ambulation Surface Level;Unlevel;Indoor;Outdoor;Grass   Standardized Balance Assessment   Standardized Balance Assessment Berg Balance Test   Berg Balance Test   Sit to Stand Able to stand without using hands and stabilize independently   Standing Unsupported Able to stand safely 2 minutes   Sitting with Back Unsupported but Feet Supported on Floor or Stool Able to sit safely and securely 2 minutes   Stand to Sit Sits safely with minimal use of hands   Transfers Able to transfer safely, minor use of hands   Standing Unsupported with Eyes Closed Able to stand 10 seconds safely   Standing Ubsupported with Feet Together Able to place feet together independently and stand for 1 minute with supervision   From Standing, Reach Forward with Outstretched Arm Can reach forward >12 cm safely (5")  9"   From Standing Position, Pick up Object from Floor Able to pick up shoe, needs supervision   From Standing Position, Turn to Look Behind Over each Shoulder Looks behind one side only/other side shows less weight shift   Turn 360 Degrees Able to turn 360 degrees safely but slowly  Standing Unsupported, Alternately Place Feet on Step/Stool Able to complete 4 steps without aid or supervision  min A at step 6 to maintain balance   Standing Unsupported, One Foot in Front Able to place foot tandem independently and hold 30 seconds   Standing on One Leg Able to lift leg independently and hold 5-10 seconds  8 seconds during both R and L SLS   Total Score 47   High Level Balance   High Level Balance Activities Head turns;Marching forwards   High Level Balance Comments In // bars with no UE support and min guard to supervison to ensure safety: marching x8 with cues to  improve narrow BOS and improve R LE eccentric control. Forward amb. with vertical/horizontal head turns x4/direction with cues for technique.                PT Education - 12/27/14 1231    Education provided Yes   Education Details Educated pt on goal progress.   Person(s) Educated Patient;Spouse   Methods Explanation   Comprehension Verbalized understanding          PT Short Term Goals - 12/27/14 1232    PT SHORT TERM GOAL #1   Title Patient will demonstate safe techinque 10/10 trials with sit to/from stand transfers to various surfaces  Target date 12/31/14   Baseline supervision verbal ques; some impulsivity   Time 4   Period Weeks   Status Achieved   PT SHORT TERM GOAL #2   Title Patient will demonstrate lowered fall risk evident by improved BERG to >45  Target date 12/31/14   Baseline 39   Time 4   Period Weeks   Status Achieved   PT SHORT TERM GOAL #3   Title Patient will be able to walk 1000' w/o loss of balance w/o use of assisted device clearing right foot with right stride.Target date 12/31/14   Baseline poor foot clearance; limited to 200-300 feet due to fatigue   Time 4   Status Achieved           PT Long Term Goals - 12/20/14 1238    PT LONG TERM GOAL #1   Title Patient will demonstrated low fall risk evident by scoring >50 on BERG.  Target date 01/26/15   Baseline 39   Time 8   Period Weeks   Status On-going   PT LONG TERM GOAL #2   Title Patient will demonstrate ability to ambulate on varied terrain including curbs and steps independenlty w/o loss of balance and demo good safety awarness at all times for 30 minutes. Target date 01/26/15   Baseline limited by fatigue   Time 8   Period Weeks   Status On-going   PT LONG TERM GOAL #3   Title Functional gait will improve evident by being able to change direction, speed  and move laterally and backward w/o loss of balance.Target date 01/26/15   Baseline limits movt to linear /forward movt; changing  direction and speed are limited   Time 8   Period Weeks   Status On-going   PT LONG TERM GOAL #4   Title Patient will improve 15% on FOTO score to demo improved subjective perception of ability    Baseline 44   Time 8   Period Weeks   Status On-going               Plan - 12/27/14 1231    Clinical Impression Statement Pt demonstrated progress, as he met all STGs (  1-3). Pt continues to require increased time to allow for processing and cues to improve R heel strike after ambulating >250'. Continue with POC.   Pt will benefit from skilled therapeutic intervention in order to improve on the following deficits Abnormal gait;Decreased coordination;Difficulty walking;Impaired tone;Impaired UE functional use;Decreased endurance;Decreased activity tolerance;Decreased knowledge of precautions;Impaired perceived functional ability;Decreased balance;Decreased mobility;Decreased strength   Rehab Potential Good   Clinical Impairments Affecting Rehab Potential current dense paresis of right UE   PT Frequency 2x / week   PT Duration 8 weeks   PT Treatment/Interventions Gait training;Neuromuscular re-education;Balance training;Therapeutic exercise;Therapeutic activities;Functional mobility training;Patient/family education;Manual techniques;Vestibular;Stair training   PT Next Visit Plan Dynamic gait/balance over compliant surfaces.   PT Home Exercise Plan Started on two ex's with sit to stand transfer and posterior lean for anterior tib exercise to begin to address foot drop   Consulted and Agree with Plan of Care Patient   Family Member Consulted spouse-[name]        Problem List Patient Active Problem List   Diagnosis Date Noted  . HLD (hyperlipidemia)   . CVA (cerebral infarction) 11/23/2014  . Stroke 11/23/2014  . Hypertension 11/23/2014    Miller,Jennifer L 12/27/2014, 12:33 PM  [facility] [address] [place],  [place], [address] Phone: [phone]   Fax:  [phone]     Geoffry Paradise, PT,DPT 12/27/2014 12:33 PM Phone: [phone] Fax: [phone]

## 2014-12-27 NOTE — Therapy (Signed)
[facility] [address] [place], [place], [address] Phone: [phone]   Fax:  [phone]  Speech Language Pathology Treatment  Patient Details  Name: Phillip Curry MRN: [id number] Date of Birth: 1955/10/15 Referring Provider:  Josetta Huddle, MD  Encounter Date: 12/27/2014      End of Session - 12/27/14 1320    Visit Number 6   Number of Visits 24   Date for SLP Re-Evaluation 01/26/15   SLP Start Time 1232   SLP Stop Time  6761   SLP Time Calculation (min) 45 min      Past Medical History  Diagnosis Date  . Hypertension   . Stroke     Past Surgical History  Procedure Laterality Date  . No past surgeries      There were no vitals filed for this visit.  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 No/denies               ADULT SLP TREATMENT - 12/27/14 1232    General Information   Behavior/Cognition Pleasant mood;Cooperative;Requires cueing   Treatment Provided   Treatment provided Cognitive-Linquistic   Pain Assessment   Pain Assessment No/denies pain   Cognitive-Linquistic Treatment   Treatment focused on Dysarthria;Cognition   Skilled Treatment Facilitated simple to mildly complex time/money problem solving with word problems - simple/basic problems 85% accurate with rare min A. Mildly complex word problems with usual mod A and 70% accurate. Pt required extended time and verbal cues to breakdown multiple steps or larger numbers into smaller chunks. Automatic math (simple multiplicatin/division/money) requiring extended time, written cues. Pt. verbalized dysarthria strategies of "slow and big" with min questioning cues. Pt performed structured speech tasks with  dysarthira with rare min A.    Assessment / Recommendations / Plan   Plan Continue with current plan of care   Progression Toward Goals   Progression toward goals Progressing toward goals            SLP Short Term Goals - 12/27/14 1320    SLP SHORT TERM GOAL #1   Title Pt will perform dysarthria HEP with rare minimal assistance   Time 2   Period Weeks   Status Achieved   SLP SHORT TERM GOAL #2   Title Pt will solve simple time, money, reasoning problems with 85% accuracy and occasional min A   Time 2   Period Weeks   Status On-going   SLP SHORT TERM GOAL #3   Title Pt will manage meds at home with compensations with rare min A over 3 sessions as reported by spouse/pt.   Time 2   Period Weeks   Status On-going          SLP Long Term Goals - 12/27/14 1319    SLP LONG TERM GOAL #1   Title Pt will be 95% intelligible in moderately complex conversation over 12 minutes   Time 6   Period Weeks   Status On-going   SLP LONG TERM GOAL #2   Title Pt will solve moderately complex time, money reasoning problems with 85% accuracy and rare min A   Time 6   Period Weeks   Status On-going   SLP LONG TERM GOAL #3   Title  Pt will utilize compensations for schedule, daily chores, lists 2x during therapy session over 2 sessions with rare min A   Time 6   Period Weeks   Status On-going          Plan - 12/27/14 1317    Clinical Impression Statement Pt requiring occassinal min A for simple/basic linguistic time/money problem solving and mod A for mildly complex linguistic time/money problem solving. Pt using compensations for dysarthira in structured tasks with rare min A. Continue skilled ST to maximize manage,ment of time/money and intellgiblity for improved independence   Speech Therapy Frequency 2x / week   Treatment/Interventions Oral motor exercises;Compensatory strategies;Patient/family education;Functional tasks;Cognitive reorganization;Internal/external aids;SLP instruction  and feedback   Potential to Achieve Goals Good   Potential Considerations Ability to learn/carryover information   Consulted and Agree with Plan of Care Patient;Family member/caregiver        Problem List Patient Active Problem List   Diagnosis Date Noted  . HLD (hyperlipidemia)   . CVA (cerebral infarction) 11/23/2014  . Stroke 11/23/2014  . Hypertension 11/23/2014    Emma Birchler, Annye Rusk MS, CCC-SLP 12/27/2014, 1:21 PM  [facility] [address] [place], [place], [address] Phone: [phone]   Fax:  [phone]

## 2014-12-29 ENCOUNTER — Ambulatory Visit: Payer: BLUE CROSS/BLUE SHIELD

## 2014-12-29 ENCOUNTER — Ambulatory Visit: Payer: BLUE CROSS/BLUE SHIELD | Admitting: Speech Pathology

## 2014-12-29 ENCOUNTER — Ambulatory Visit: Payer: BLUE CROSS/BLUE SHIELD | Admitting: Occupational Therapy

## 2014-12-29 ENCOUNTER — Encounter: Payer: Self-pay | Admitting: Occupational Therapy

## 2014-12-29 DIAGNOSIS — IMO0002 Reserved for concepts with insufficient information to code with codable children: Secondary | ICD-10-CM

## 2014-12-29 DIAGNOSIS — I69322 Dysarthria following cerebral infarction: Secondary | ICD-10-CM

## 2014-12-29 DIAGNOSIS — R269 Unspecified abnormalities of gait and mobility: Secondary | ICD-10-CM

## 2014-12-29 DIAGNOSIS — R41841 Cognitive communication deficit: Secondary | ICD-10-CM

## 2014-12-29 DIAGNOSIS — G8191 Hemiplegia, unspecified affecting right dominant side: Secondary | ICD-10-CM | POA: Diagnosis not present

## 2014-12-29 NOTE — Patient Instructions (Addendum)
   Functional Quadriceps: Sit to Stand   Sit on edge of chair, feet flat on floor. Stand upright, extending knees fully. Repeat __10__ times per set. Do _1__ sets per session. Do __2__ sessions per day.  Progress by sitting on "squishy-soft" surfaces and lower surfaces. http://orth.exer.us/734   Copyright  VHI. All rights reserved.  Heel Raise: Bilateral (Standing)   Rise on balls of feet and hold for 2-3 seconds. Hover fingertips at counter for safety. Repeat __10-20__ times per set. Do _1___ sets per session. Do _1___ sessions per day.  http://orth.exer.us/38   Copyright  VHI. All rights reserved.  Toe Raise (Standing)   Rock back on heels and hold for 2-3 seconds. Hold onto with fingertips. Repeat __10-20__ times per set. Do ___1_ sets per session. Do _1___ sessions per day.  http://orth.exer.us/42   Copyright  VHI. All rights reserved.   FLEXION: Standing - Stable (Active)   Stand, both feet flat. Bend right knee, bringing heel toward buttocks. No weight. Make sure knee does not come forward, keep right leg in line with left leg. Complete _3__ sets of _10__ repetitions. Perform __3-4_ sessions per week.  http://gtsc.exer.us/240    Terminal Knee Extension (Standing)   Facing anchor with right knee slightly bent and tubing just above knee, gently pull knee back straight. Do not overextend knee. Hold for 5 seconds. Use green band. Repeat __10__ times per set. Do _3___ sets per session. Do __3-4__ sessions per week.  http://orth.exer.us/666   Copyright  VHI. All rights reserved.      Straight Leg Raise   Tighten stomach and slowly raise locked right leg _3-4___ inches from floor. Hold in air for 5 seconds. Use 4# ankle weight. Repeat _10__ times per set. Do _2_ sets per session. Do _3-4___ sessions per week.  http://orth.exer.us/1102   Copyright  VHI. All rights reserved.  Strengthening: Hip Extension (Prone)   Tighten muscles on front of  right thigh, then lift leg __2-3__ inches from surface, keeping knee locked. Hold for 5 seconds. Repeat _10___ times per set. Do _2___ sets per session. Do _1-2___ sessions per day.  http://orth.exer.us/620   Copyright  VHI. All rights reserved.   EXTENSION: Prone - Knee Flexed (Active)   Lie on stomach, right knee bent to 90. Lift leg toward ceiling. NO WEIGHT AT THIS TIME. Slowly lower leg back down to mat. (he may need assistance to keep right knee bent in correct position) Complete _2__ sets of __10_ repetitions. Perform _3-4__ sessions per week.  http://gtsc.exer.us/66   Copyright  VHI. All rights reserved.    Side Stepping With Semi-Squat   Step to side, opening arms and legs while lowering to semi-squat position keeping heels on ground. Take 5 steps to the right side and then 5 steps to the left side. Repeat 3 times in each direction. Tie red band around ankles. Copyright  VHI. All rights reserved.    "I love a Nurse, learning disability as needed. March about 10 feet along counter, with knees and hips at a 90 degree angle. Repeat __4__ times. Do __1__ sessions per day.  http://gt2.exer.us/344   Copyright  VHI. All rights reserved.

## 2014-12-29 NOTE — Therapy (Signed)
[facility] [address] [place], [place], [address] Phone: [phone]   Fax:  [phone]  Occupational Therapy Treatment  Patient Details  Name: Phillip Curry MRN: [id number] Date of Birth: Apr 28, 1955 Referring Provider:  Josetta Huddle, MD  Encounter Date: 12/29/2014      OT End of Session - 12/29/14 1310    Visit Number 8   Number of Visits 17   Date for OT Re-Evaluation 02/05/15   Authorization Type BCBS   Authorization - Number of Visits 40   OT Start Time 0935   OT Stop Time 1015   OT Time Calculation (min) 40 min   Activity Tolerance Patient tolerated treatment well   Behavior During Therapy [facility] for tasks assessed/performed      Past Medical History  Diagnosis Date  . Hypertension   . Stroke     Past Surgical History  Procedure Laterality Date  . No past surgeries      There were no vitals filed for this visit.  Visit Diagnosis:  Hemiplegia affecting right dominant side  Lack of coordination due to stroke      Subjective Assessment - 12/29/14 1254    Subjective  Pt reports that he is not using RUE at home.   Patient is accompained by: Family member   Pertinent History see Epic snapshot   Patient Stated Goals to get better   Currently in Pain? No/denies                      OT Treatments/Exercises (OP) - 12/29/14 0001    Neurological Re-education Exercises   Other Exercises 1 In supine, closed chain shoulder flex and chest press with PVC frame with min facilitation/cues.  In prone, scapular retraction with min cues.  In prone, wt. bearing through elbows with llifting chest for incr scapular stability.  Wt. bearing through RUE with mat on hand/elbow with body on arm movements in diagonal patterns and with trunk rotation.  Low-range functional reaching to grasp cylinder objects with min-mod cues for normal movement patterns and practiced bringing spoon with built-up grip to mouth  with mod cues/facilitation and to lift R hand onto table using normal movement patterns with min facilitation.                  OT Education - 12/29/14 1256    Education Details Instructed pt to begin to lift RUE to lap/table without LUE assist using normal movement patterns and to begin grasping light weight cylinder objects (low-range) to incr RUE functional use   Person(s) Educated Patient;Spouse   Methods Explanation;Demonstration;Verbal cues;Tactile cues   Comprehension Verbalized understanding;Returned demonstration;Verbal cues required          OT Short Term Goals - 12/27/14 1145    OT SHORT TERM GOAL #1   Title I with HEP   Baseline 01/07/15   Time 4   Period Weeks   Status Achieved   OT SHORT TERM GOAL #2   Title Pt will perform bathing with min A   Time 4   Period Weeks   Status Achieved   OT SHORT TERM GOAL #3   Title Pt will perform dressing with supervision/ setup   Time 4   Period Weeks   Status Achieved  except tying shoes (pt leaves tied. O.T. shown alternative options/AE)   OT SHORT TERM GOAL #4   Title Pt will use RUE as a stabilizer with minA   Time 4  Period Weeks   Status On-going   OT SHORT TERM GOAL #5   Title Pt will demonstrate 50% A/ROM elbow flexion/ extension.   Time 4   Period Weeks   Status Achieved   OT SHORT TERM GOAL #6   Title Pt will be modified independent with RUE positioning to minimize pain, risk for injury including splint wear PRN.   Time 4   Period Weeks   Status Achieved           OT Long Term Goals - 12/22/14 1110    OT LONG TERM GOAL #1   Title Pt will use RUE as a stabilizer/ gross A for ADLs at least 25% of the time.   Baseline due 02/05/15   Time 8   Period Weeks   Status New   OT LONG TERM GOAL #2   Title Pt will demonstrate 30* shoulder flexion/ scaption in prep for functional reach.   Time 8   Period Weeks   Status New   OT LONG TERM GOAL #3   Title Pt will perform all basic ADLs with distant  supervision.   Time 8   Period Weeks   Status New   OT LONG TERM GOAL #4   Title Pt will perfom simple snack prep/ light home management with supervision.   Time 8   Period Weeks   Status New               Plan - 12/29/14 1311    Clinical Impression Statement Pt progressing well with incr RUE control.  Pt responds well to cues for normal movement patterns.   Plan neuro re-ed   Consulted and Agree with Plan of Care Patient;Family member/caregiver   Family Member Consulted wife [name]        Problem List Patient Active Problem List   Diagnosis Date Noted  . HLD (hyperlipidemia)   . CVA (cerebral infarction) 11/23/2014  . Stroke 11/23/2014  . Hypertension 11/23/2014    [facility] 12/29/2014, 1:12 PM  [facility] [address] [place], [place], [address] Phone: [phone]   Fax:  [place], OTR/L 12/29/2014 1:12 PM

## 2014-12-29 NOTE — Therapy (Signed)
[facility] [address] [place], [place], [address] Phone: [phone]   Fax:  [phone]  Speech Language Pathology Treatment  Patient Details  Name: Phillip Curry MRN: [id number] Date of Birth: 1955-06-04 Referring Provider:  Josetta Huddle, MD  Encounter Date: 12/29/2014      End of Session - 12/29/14 1152    Visit Number 8   Number of Visits 24   Date for SLP Re-Evaluation 01/26/15   SLP Start Time 1103   SLP Stop Time  1147   SLP Time Calculation (min) 44 min   Activity Tolerance Patient tolerated treatment well      Past Medical History  Diagnosis Date  . Hypertension   . Stroke     Past Surgical History  Procedure Laterality Date  . No past surgeries      There were no vitals filed for this visit.  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 No/denies               ADULT SLP TREATMENT - 12/29/14 1104    General Information   Behavior/Cognition Pleasant mood;Cooperative;Requires cueing   Treatment Provided   Treatment provided Cognitive-Linquistic   Cognitive-Linquistic Treatment   Treatment focused on Dysarthria;Cognition   Skilled Treatment Simple problem solving basic math addition/subtraction with card game with extended time and occasional verbal cues for basic math and visual cues for recall of rules for sorting cards. Pt utilized slow rate and overarticulation during simple conversation with  rare min A over 5 minute conversation. I added to HEP with rhyming sentences and multisyllabic words.   Assessment / Recommendations / Plan   Plan Continue with current plan of care   Progression Toward Goals   Progression toward goals Progressing toward goals             SLP Short Term Goals - 12/29/14 1142    SLP SHORT TERM GOAL #1   Title Pt will perform dysarthria HEP with rare minimal assistance   Time 2   Period Weeks   Status Achieved   SLP SHORT TERM GOAL #2   Title Pt will solve simple time, money, reasoning problems with 85% accuracy and occasional min A   Time 2   Period Weeks   Status On-going   SLP SHORT TERM GOAL #3   Title Pt will manage meds at home with compensations with rare min A over 3 sessions as reported by spouse/pt.   Time 2   Period Weeks   Status Achieved          SLP Long Term Goals - 12/29/14 1152    SLP LONG TERM GOAL #1   Title Pt will be 95% intelligible in moderately complex conversation over 12 minutes   Time 6   Period Weeks   Status On-going   SLP LONG TERM GOAL #2   Title Pt will solve moderately complex time, money reasoning problems with 85% accuracy and rare min A   Time 6   Period Weeks   Status On-going   SLP LONG TERM GOAL #3   Title Pt will utilize compensations for schedule, daily chores, lists 2x during therapy session over 2 sessions with rare min A   Time 6   Period  Weeks   Status On-going          Plan - 12/29/14 1146    Clinical Impression Statement Spouse reported pt has recalled meds for over 2 weeks with modified independence. Pt is performing HEP for dysparthira, I added to this today. Pt continues to require min A to carryover dysarthira compensations into conversation. Recall , reasoning and simplle math continue to reqiured extended time and mod A.. Recommend continue skilled ST to maximize cognition and intelliigbility.    Speech Therapy Frequency 2x / week   Treatment/Interventions Oral motor exercises;Compensatory strategies;Patient/family education;Functional tasks;Cognitive reorganization;Internal/external aids;SLP instruction and feedback   Potential to Achieve Goals Good        Problem List Patient Active Problem List   Diagnosis Date Noted  . HLD  (hyperlipidemia)   . CVA (cerebral infarction) 11/23/2014  . Stroke 11/23/2014  . Hypertension 11/23/2014    [place], Annye Rusk MS, CCC-SLP 12/29/2014, 11:57 AM  [facility] [address] [place], [place], [address] Phone: [phone]   Fax:  [phone]

## 2014-12-29 NOTE — Therapy (Signed)
[facility] [address] [place], [place], [address] Phone: [phone]   Fax:  [phone]  Physical Therapy Treatment  Patient Details  Name: Phillip Curry MRN: [id number] Date of Birth: 20-Nov-1955 Referring Provider:  Josetta Huddle, MD  Encounter Date: 12/29/2014      PT End of Session - 12/29/14 1159    Visit Number 9   Number of Visits 20   Date for PT Re-Evaluation 02/03/15   Authorization Type BCBS   PT Start Time 1019   PT Stop Time 1059   PT Time Calculation (min) 40 min   Activity Tolerance Patient tolerated treatment well   Behavior During Therapy [facility] for tasks assessed/performed      Past Medical History  Diagnosis Date  . Hypertension   . Stroke     Past Surgical History  Procedure Laterality Date  . No past surgeries      There were no vitals filed for this visit.  Visit Diagnosis:  Weakness due to cerebrovascular accident  Abnormality of gait      Subjective Assessment - 12/29/14 1020    Subjective Pt denied falls or change since last visit.   Patient is accompained by: Family member  [name]-wife   Pertinent History relatively healthy man prior to CVA on 11/23/14   Patient Stated Goals get using my right hand good again , walking and talking better   Currently in Pain? No/denies          Therex: Pt performed, reviewed and progressed HEP as tolerated. Cues and demonstration for technique and to improve eccentric control. Cues to perform R quad set prior to R SLR to decrease quad lag. Please see pt instructions for details.  Neuro re-ed: marches at counter, added to HEP, see pt instructions for details.                       PT Education - 12/29/14 1159    Education provided Yes   Education Details Pt performed, reviewed and progressed HEP as tolerated.   Person(s) Educated Patient;Spouse   Methods Explanation;Demonstration;Tactile cues;Verbal cues;Handout   Comprehension Returned demonstration;Verbalized understanding          PT Short Term Goals - 12/27/14 1232    PT SHORT TERM GOAL #1   Title Patient will demonstate safe techinque 10/10 trials with sit to/from stand transfers to various surfaces  Target date 12/31/14   Baseline supervision verbal ques; some impulsivity   Time 4   Period Weeks   Status Achieved   PT SHORT TERM GOAL #2   Title Patient will demonstrate lowered fall risk evident by improved BERG to >45  Target date 12/31/14   Baseline 39   Time 4   Period Weeks   Status Achieved   PT SHORT TERM GOAL #3   Title Patient will be able to walk 1000' w/o loss of balance w/o use of assisted device clearing right foot with right stride.Target date 12/31/14   Baseline poor foot clearance; limited to 200-300 feet due to fatigue   Time 4   Status Achieved           PT Long Term Goals - 12/20/14 1238    PT LONG TERM GOAL #1   Title Patient will demonstrated low fall risk evident by scoring >50 on BERG.  Target date 01/26/15   Baseline 39   Time 8   Period Weeks   Status On-going   PT LONG TERM  GOAL #2   Title Patient will demonstrate ability to ambulate on varied terrain including curbs and steps independenlty w/o loss of balance and demo good safety awarness at all times for 30 minutes. Target date 01/26/15   Baseline limited by fatigue   Time 8   Period Weeks   Status On-going   PT LONG TERM GOAL #3   Title Functional gait will improve evident by being able to change direction, speed  and move laterally and backward w/o loss of balance.Target date 01/26/15   Baseline limits movt to linear /forward movt; changing direction and speed are limited   Time 8   Period Weeks   Status On-going   PT LONG TERM GOAL #4   Title Patient will improve 15% on FOTO score to demo improved subjective perception of ability    Baseline 44   Time 8   Period Weeks   Status On-going               Plan - 12/29/14 1200    Clinical  Impression Statement Pt demonstrated improvements in strength, as he was able to tolerate increased sets and weights during strengthening HEP. Pt continues to require cues and time to allow for cognitive processing. Pt would continue to benefit from skilled PT to improve safety during functional mobiity.    Pt will benefit from skilled therapeutic intervention in order to improve on the following deficits Abnormal gait;Decreased coordination;Difficulty walking;Impaired tone;Impaired UE functional use;Decreased endurance;Decreased activity tolerance;Decreased knowledge of precautions;Impaired perceived functional ability;Decreased balance;Decreased mobility;Decreased strength   Rehab Potential Good   Clinical Impairments Affecting Rehab Potential current dense paresis of right UE   PT Frequency 2x / week   PT Duration 8 weeks   PT Treatment/Interventions Gait training;Neuromuscular re-education;Balance training;Therapeutic exercise;Therapeutic activities;Functional mobility training;Patient/family education;Manual techniques;Vestibular;Stair training   PT Next Visit Plan Dynamic gait/balance over compliant surfaces.   PT Home Exercise Plan Strength/balance HEP.   Consulted and Agree with Plan of Care Patient   Family Member Consulted spouse-[name]        Problem List Patient Active Problem List   Diagnosis Date Noted  . HLD (hyperlipidemia)   . CVA (cerebral infarction) 11/23/2014  . Stroke 11/23/2014  . Hypertension 11/23/2014    Seirra Kos L 12/29/2014, 12:02 PM  [facility] [address] [place], [place], [address] Phone: [phone]   Fax:  [phone]     Geoffry Paradise, PT,DPT 12/29/2014 12:02 PM Phone: [phone] Fax: [phone]

## 2015-01-03 ENCOUNTER — Ambulatory Visit: Payer: BLUE CROSS/BLUE SHIELD | Admitting: Occupational Therapy

## 2015-01-03 ENCOUNTER — Encounter: Payer: Self-pay | Admitting: Occupational Therapy

## 2015-01-03 ENCOUNTER — Encounter: Payer: Self-pay | Admitting: Physical Therapy

## 2015-01-03 ENCOUNTER — Ambulatory Visit: Payer: BLUE CROSS/BLUE SHIELD

## 2015-01-03 ENCOUNTER — Ambulatory Visit: Payer: BLUE CROSS/BLUE SHIELD | Admitting: Physical Therapy

## 2015-01-03 DIAGNOSIS — G8191 Hemiplegia, unspecified affecting right dominant side: Secondary | ICD-10-CM | POA: Diagnosis not present

## 2015-01-03 DIAGNOSIS — I69322 Dysarthria following cerebral infarction: Secondary | ICD-10-CM

## 2015-01-03 DIAGNOSIS — IMO0002 Reserved for concepts with insufficient information to code with codable children: Secondary | ICD-10-CM

## 2015-01-03 DIAGNOSIS — R41841 Cognitive communication deficit: Secondary | ICD-10-CM

## 2015-01-03 DIAGNOSIS — R269 Unspecified abnormalities of gait and mobility: Secondary | ICD-10-CM

## 2015-01-03 NOTE — Therapy (Signed)
[facility] [address] [place], [place], [address] Phone: [phone]   Fax:  [phone]  Occupational Therapy Treatment  Patient Details  Name: Phillip Curry MRN: [id number] Date of Birth: December 03, 1955 Referring Provider:  Josetta Huddle, MD  Encounter Date: 01/03/2015      OT End of Session - 01/03/15 1150    Visit Number 9   Number of Visits 17   Date for OT Re-Evaluation 02/05/15   Authorization Type BCBS   Authorization - Number of Visits 40   OT Start Time 1100   OT Stop Time 1145   OT Time Calculation (min) 45 min   Activity Tolerance Patient tolerated treatment well      Past Medical History  Diagnosis Date  . Hypertension   . Stroke     Past Surgical History  Procedure Laterality Date  . No past surgeries      There were no vitals filed for this visit.  Visit Diagnosis:  Hemiplegia affecting right dominant side  Weakness due to cerebrovascular accident      Subjective Assessment - 01/03/15 1140    Subjective  Pt had Rt shoulder pain last night 2/10, but none today   Pertinent History see Epic snapshot   Patient Stated Goals to get better   Currently in Pain? No/denies                      OT Treatments/Exercises (OP) - 01/03/15 0001    Exercises   Exercises --  UBE x 8 min. Level 2, Rt hand wrapped for reciprocal mvmt pattern   Neurological Re-education Exercises   Other Exercises 1 Arm on body movements maintaining RUE in 90* shoulder flexion for entire RUE activation. Followed by AA/ROM in reciprocal movements BUE's then simultaneously movements (low to mid range) with min assist RUE.    Other Exercises 2 Supine: bilateral sh. flexion with PVC frame independently. Pt only occasionally needed assist for Rt hand placement.    Other Weight-Bearing Exercises 1 Seated: bridging over RUE x 10 with min cues to perform correctly   Other Weight-Bearing Exercises 2 Seated with  BUE sh. extension and ER bridging off mat with modfiications prn. Supine: propped on elbows, disengaging LUE for reaching to encourage RUE scapula retraction and depression                  OT Short Term Goals - 12/27/14 1145    OT SHORT TERM GOAL #1   Title I with HEP   Baseline 01/07/15   Time 4   Period Weeks   Status Achieved   OT SHORT TERM GOAL #2   Title Pt will perform bathing with min A   Time 4   Period Weeks   Status Achieved   OT SHORT TERM GOAL #3   Title Pt will perform dressing with supervision/ setup   Time 4   Period Weeks   Status Achieved  except tying shoes (pt leaves tied. O.T. shown alternative options/AE)   OT SHORT TERM GOAL #4   Title Pt will use RUE as a stabilizer with minA   Time 4   Period Weeks   Status On-going   OT SHORT TERM GOAL #5   Title Pt will demonstrate 50% A/ROM elbow flexion/ extension.   Time 4   Period Weeks   Status Achieved   OT SHORT TERM GOAL #6   Title Pt will be modified independent with RUE positioning to  minimize pain, risk for injury including splint wear PRN.   Time 4   Period Weeks   Status Achieved           OT Long Term Goals - 12/22/14 1110    OT LONG TERM GOAL #1   Title Pt will use RUE as a stabilizer/ gross A for ADLs at least 25% of the time.   Baseline due 02/05/15   Time 8   Period Weeks   Status New   OT LONG TERM GOAL #2   Title Pt will demonstrate 30* shoulder flexion/ scaption in prep for functional reach.   Time 8   Period Weeks   Status New   OT LONG TERM GOAL #3   Title Pt will perform all basic ADLs with distant supervision.   Time 8   Period Weeks   Status New   OT LONG TERM GOAL #4   Title Pt will perfom simple snack prep/ light home management with supervision.   Time 8   Period Weeks   Status New               Plan - 01/03/15 1237    Clinical Impression Statement Pt very motivated and shows progress each week. Pt now with increased shoulder flexion control  in supine, able to perform higher closed chain body on arm movements, and beginning finger extension emerging.    Plan Neuro re-educ., estim for finger extensors   OT Home Exercise Plan 12/12/14: Initial HEP and bed positioning handout, A/E recommendations.    Consulted and Agree with Plan of Care Patient;Family member/caregiver   Family Member Consulted wife [name]        Problem List Patient Active Problem List   Diagnosis Date Noted  . HLD (hyperlipidemia)   . CVA (cerebral infarction) 11/23/2014  . Stroke 11/23/2014  . Hypertension 11/23/2014    Carey Bullocks, OTR/L 01/03/2015, 12:40 PM  [facility] [address] [place], [place], [address] Phone: [phone]   Fax:  [phone]

## 2015-01-03 NOTE — Therapy (Signed)
[facility] [address] [place], [place], [address] Phone: [phone]   Fax:  [phone]  Speech Language Pathology Treatment  Patient Details  Name: Phillip Curry MRN: [id number] Date of Birth: [birth year] Referring Provider:  Josetta Huddle, MD  Encounter Date: 01/03/2015      End of Session - 01/03/15 1319    Visit Number 9   Number of Visits 24   Date for SLP Re-Evaluation 01/26/15   SLP Start Time 1148   SLP Stop Time  1231   SLP Time Calculation (min) 43 min   Activity Tolerance Patient tolerated treatment well      Past Medical History  Diagnosis Date  . Hypertension   . Stroke     Past Surgical History  Procedure Laterality Date  . No past surgeries      There were no vitals filed for this visit.  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 SLP made change on med list.   Patient is accompained by: Family member  wife               ADULT SLP TREATMENT - 01/03/15 1152    General Information   Behavior/Cognition Pleasant mood;Cooperative;Requires cueing   Treatment Provided   Treatment provided Cognitive-Linquistic   Cognitive-Linquistic Treatment   Treatment focused on Dysarthria;Cognition   Skilled Treatment (speech therapy 30 minutes): Pt reports he is completing HEP thinking about "slow" and "big". SLP assessed pt's success with HEP for dysartrhia which was said with big and slow movements with exaggeration approx 85% of the time. SLP encouraged pt to read a portion of all HEP from multisyllabic words to sentences with rhyming words (approx 80%).  (cog skills 19 minutes): SLP facilitated therapy tasks with pt to improve attention and memory (processing speed). Pt benefitted from written cues consistently and SLP verbal cues  occasionally for solving simple time and calendar problems presented auditorily.   Assessment / Recommendations / Plan   Plan Continue with current plan of care   Progression Toward Goals   Progression toward goals Progressing toward goals          SLP Education - 01/03/15 1319    Education provided Yes   Education Details how to modify homework for pt's benefit   Person(s) Educated Patient;Spouse   Methods Explanation;Demonstration   Comprehension Verbalized understanding          SLP Short Term Goals - 01/03/15 1322    SLP SHORT TERM GOAL #1   Title Pt will perform dysarthria HEP with rare minimal assistance   Time 1   Period Weeks   Status Achieved   SLP SHORT TERM GOAL #2   Title Pt will solve simple time, money, reasoning problems with 85% accuracy and occasional min A   Time 1   Period Weeks   Status On-going   SLP SHORT TERM GOAL #3   Title Pt will manage meds at home with compensations with rare min A over 3 sessions as reported by spouse/pt.   Time 1   Period Weeks   Status Achieved          SLP Long Term Goals - 01/03/15 1323    SLP LONG TERM GOAL #1   Title Pt will be 95% intelligible in moderately complex conversation over 12 minutes   Time  5   Period Weeks   Status On-going   SLP LONG TERM GOAL #2   Title Pt will solve moderately complex time, money reasoning problems with 85% accuracy and rare min A   Time 5   Period Weeks   Status On-going   SLP LONG TERM GOAL #3   Title Pt will utilize compensations for schedule, daily chores, lists 2x during therapy session over 2 sessions with rare min A   Time 5   Period Weeks   Status On-going          Plan - 01/03/15 1320    Clinical Impression Statement Pt cont to perform HEP for dysarthria. Simple math presented auditorily is best augmented with written cues otherwise pt has little success. Skilled ST needs to cont to decr caregiver burden and improve cognitive and speech skills closer to  premorbid levels.   Speech Therapy Frequency 2x / week   Duration --  5 weeks   Treatment/Interventions Oral motor exercises;Compensatory strategies;Patient/family education;Functional tasks;Cognitive reorganization;Internal/external aids;SLP instruction and feedback   Potential to Achieve Goals Good   Potential Considerations Ability to learn/carryover information        Problem List Patient Active Problem List   Diagnosis Date Noted  . HLD (hyperlipidemia)   . CVA (cerebral infarction) 11/23/2014  . Stroke 11/23/2014  . Hypertension 11/23/2014    [facility] , [place], [place]  01/03/2015, 1:27 PM  [facility] [address] [place], [place], [address] Phone: [phone]   Fax:  [phone]

## 2015-01-03 NOTE — Therapy (Signed)
[facility] [address] [place], [place], [address] Phone: [phone]   Fax:  [phone]  Physical Therapy Treatment  Patient Details  Name: Phillip Curry MRN: [id number] Date of Birth: 18-Oct-1955 Referring Provider:  Josetta Huddle, MD  Encounter Date: 01/03/2015      PT End of Session - 01/03/15 1236    Visit Number 10   Number of Visits 20   Date for PT Re-Evaluation 02/03/15   Authorization Type BCBS   PT Start Time 1233   PT Stop Time 1313   PT Time Calculation (min) 40 min   Activity Tolerance Patient tolerated treatment well   Behavior During Therapy [facility] for tasks assessed/performed      Past Medical History  Diagnosis Date  . Hypertension   . Stroke     Past Surgical History  Procedure Laterality Date  . No past surgeries      There were no vitals filed for this visit.  Visit Diagnosis:  Abnormality of gait  Weakness due to cerebrovascular accident  Lack of coordination due to stroke      Subjective Assessment - 01/03/15 1235    Subjective No new complaints. No falls or pain to report. HEP form last session is going okay.   Currently in Pain? No/denies   Pain Score 0-No pain     Treatment: Gait: >/= 1000 feet no AD on indoor level/outdoor paved surfaces with supervision. Cues on posture and occasional cues right foot clearance with inclines/declines. No LOB noted with gait.   Exercises: With light UE support Heel raises 5 sec hold x 10 reps Toe raises 5 sec hold x 10 reps Right single leg stance: heel raises x 10, toe raises x 10  Quadruped over red ball: Right leg kick outs x 10 reps Right leg glut kick backs x 10 reps  Tall kneeling: with UE support for sit backs only on red pball, no UE support for knee walking  partial sit backs x 10 reps Side stepping with emphasis on tall posture and weight shifting x 2 laps each way Fwd/bwd stepping with emphasis on tall posture and  weight shifting to lift each leg fully with advancement x 2 laps   At stairs: with left UE support with bottom 3 stairs: toe taping to each up/down x 10 reps With bottom 2 stairs: heel tapping to each up/down x 10 reps   sit<>stand with left leg on 6 inch box x 10 reps. Cues on posture and to maintain neutral trunk position         PT Short Term Goals - 12/27/14 1232    PT SHORT TERM GOAL #1   Title Patient will demonstate safe techinque 10/10 trials with sit to/from stand transfers to various surfaces  Target date 12/31/14   Baseline supervision verbal ques; some impulsivity   Time 4   Period Weeks   Status Achieved   PT SHORT TERM GOAL #2   Title Patient will demonstrate lowered fall risk evident by improved BERG to >45  Target date 12/31/14   Baseline 39   Time 4   Period Weeks   Status Achieved   PT SHORT TERM GOAL #3   Title Patient will be able to walk 1000' w/o loss of balance w/o use of assisted device clearing right foot with right stride.Target date 12/31/14   Baseline poor foot clearance; limited to 200-300 feet due to fatigue   Time 4   Status Achieved  PT Long Term Goals - 12/20/14 1238    PT LONG TERM GOAL #1   Title Patient will demonstrated low fall risk evident by scoring >50 on BERG.  Target date 01/26/15   Baseline 39   Time 8   Period Weeks   Status On-going   PT LONG TERM GOAL #2   Title Patient will demonstrate ability to ambulate on varied terrain including curbs and steps independenlty w/o loss of balance and demo good safety awarness at all times for 30 minutes. Target date 01/26/15   Baseline limited by fatigue   Time 8   Period Weeks   Status On-going   PT LONG TERM GOAL #3   Title Functional gait will improve evident by being able to change direction, speed  and move laterally and backward w/o loss of balance.Target date 01/26/15   Baseline limits movt to linear /forward movt; changing direction and speed are limited   Time 8    Period Weeks   Status On-going   PT LONG TERM GOAL #4   Title Patient will improve 15% on FOTO score to demo improved subjective perception of ability    Baseline 44   Time 8   Period Weeks   Status On-going           Plan - 01/03/15 1237    Clinical Impression Statement Pt challenged with strengthening exercises today, needing rest breaks due to fatigue. Progressing well towards goals.   Pt will benefit from skilled therapeutic intervention in order to improve on the following deficits Abnormal gait;Decreased coordination;Difficulty walking;Impaired tone;Impaired UE functional use;Decreased endurance;Decreased activity tolerance;Decreased knowledge of precautions;Impaired perceived functional ability;Decreased balance;Decreased mobility;Decreased strength   Rehab Potential Good   Clinical Impairments Affecting Rehab Potential current dense paresis of right UE   PT Frequency 2x / week   PT Duration 8 weeks   PT Treatment/Interventions Gait training;Neuromuscular re-education;Balance training;Therapeutic exercise;Therapeutic activities;Functional mobility training;Patient/family education;Manual techniques;Vestibular;Stair training   PT Next Visit Plan Dynamic gait/balance over compliant surfaces;continue lower extremity strengthening   PT Home Exercise Plan Strength/balance HEP.   Consulted and Agree with Plan of Care Patient   Family Member Consulted spouse-[name]        Problem List Patient Active Problem List   Diagnosis Date Noted  . HLD (hyperlipidemia)   . CVA (cerebral infarction) 11/23/2014  . Stroke 11/23/2014  . Hypertension 11/23/2014    Willow Ora 01/04/2015, 9:45 AM  Willow Ora, PTA, [facility] [address], [place], [place] [address] [phone] 01/04/2015, 9:45 AM

## 2015-01-05 ENCOUNTER — Ambulatory Visit: Payer: BLUE CROSS/BLUE SHIELD | Admitting: Speech Pathology

## 2015-01-05 ENCOUNTER — Encounter: Payer: Self-pay | Admitting: Physical Therapy

## 2015-01-05 ENCOUNTER — Ambulatory Visit: Payer: BLUE CROSS/BLUE SHIELD | Admitting: Occupational Therapy

## 2015-01-05 ENCOUNTER — Encounter: Payer: Self-pay | Admitting: Occupational Therapy

## 2015-01-05 ENCOUNTER — Ambulatory Visit: Payer: BLUE CROSS/BLUE SHIELD | Admitting: Physical Therapy

## 2015-01-05 DIAGNOSIS — G8191 Hemiplegia, unspecified affecting right dominant side: Secondary | ICD-10-CM

## 2015-01-05 DIAGNOSIS — R41841 Cognitive communication deficit: Secondary | ICD-10-CM

## 2015-01-05 DIAGNOSIS — IMO0002 Reserved for concepts with insufficient information to code with codable children: Secondary | ICD-10-CM

## 2015-01-05 DIAGNOSIS — R269 Unspecified abnormalities of gait and mobility: Secondary | ICD-10-CM

## 2015-01-05 NOTE — Therapy (Signed)
[facility] [address] [place], [place], [address] Phone: [phone]   Fax:  [phone]  Physical Therapy Treatment  Patient Details  Name: Phillip Curry MRN: [id number] Date of Birth: 11-03-1955 Referring Provider:  Josetta Huddle, MD  Encounter Date: 01/05/2015   01/05/15 1021  PT Visits / Re-Eval  Visit Number 11  Number of Visits 20  Date for PT Re-Evaluation 02/03/15  Authorization  Authorization Type BCBS  PT Time Calculation  PT Start Time 1018  PT Stop Time 1100  PT Time Calculation (min) 42 min  PT - End of Session  Activity Tolerance Patient tolerated treatment well  Behavior During Therapy [facility] for tasks assessed/performed     Past Medical History  Diagnosis Date  . Hypertension   . Stroke     Past Surgical History  Procedure Laterality Date  . No past surgeries      There were no vitals filed for this visit.  Visit Diagnosis:   Abnormality of gait    Weakness due to cerebrovascular accident    Lack of coordination due to stroke         Subjective Assessment - 01/05/15 1020    Subjective No new complaints. No falls or pain to report. Tired afer last session.   Patient is accompained by: Family member  spouse   Currently in Pain? No/denies            [facility] Adult PT Treatment/Exercise - 01/05/15 1022    Ambulation/Gait   Ambulation/Gait Yes   Ambulation/Gait Assistance 5: Supervision   Ambulation Distance (Feet) 1000 Feet   Assistive device None   Gait Pattern Step-through pattern;Decreased arm swing - right;Decreased dorsiflexion - right;Right hip hike;Lateral trunk lean to left;Decreased trunk rotation;Right genu recurvatum   Ambulation Surface Level;Unlevel;Indoor;Outdoor;Paved     Neuro Re-ed: On red mats  -with 6 cones in row at edge: min guard to min assist for balance Fwd toe taps to each with side stepping, fwd double toe taps to each with side stepping and  alternating flipping over/up to each cone with side stepping.  - no cones High knee marching fwd/bwd, toe walking fwd/bwd and tandem gait fwd/bwd x 3 laps each/each way with min guard to min assist for balance.  Exercises: In prone Hip extension x 10 reps each leg, alternating legs Hip glut kick backs, right leg only x 10 reps  Supine: right leg strengthening Short arc quads 4# x 10 reps Straight leg raises 4# x 10 reps  Seated Long arc quads 4#  X 10 reps right leg only  Standing Right single leg stance with HHA, single leg mini squats x 5 reps          PT Short Term Goals - 12/27/14 1232    PT SHORT TERM GOAL #1   Title Patient will demonstate safe techinque 10/10 trials with sit to/from stand transfers to various surfaces  Target date 12/31/14   Baseline supervision verbal ques; some impulsivity   Time 4   Period Weeks   Status Achieved   PT SHORT TERM GOAL #2   Title Patient will demonstrate lowered fall risk evident by improved BERG to >45  Target date 12/31/14   Baseline 39   Time 4   Period Weeks   Status Achieved   PT SHORT TERM GOAL #3   Title Patient will be able to walk 1000' w/o loss of balance w/o use of assisted device clearing right foot with right stride.Target date  12/31/14   Baseline poor foot clearance; limited to 200-300 feet due to fatigue   Time 4   Status Achieved           PT Long Term Goals - 12/20/14 1238    PT LONG TERM GOAL #1   Title Patient will demonstrated low fall risk evident by scoring >50 on BERG.  Target date 01/26/15   Baseline 39   Time 8   Period Weeks   Status On-going   PT LONG TERM GOAL #2   Title Patient will demonstrate ability to ambulate on varied terrain including curbs and steps independenlty w/o loss of balance and demo good safety awarness at all times for 30 minutes. Target date 01/26/15   Baseline limited by fatigue   Time 8   Period Weeks   Status On-going   PT LONG TERM GOAL #3   Title Functional gait  will improve evident by being able to change direction, speed  and move laterally and backward w/o loss of balance.Target date 01/26/15   Baseline limits movt to linear /forward movt; changing direction and speed are limited   Time 8   Period Weeks   Status On-going   PT LONG TERM GOAL #4   Title Patient will improve 15% on FOTO score to demo improved subjective perception of ability    Baseline 44   Time 8   Period Weeks   Status On-going        01/05/15 1021  Plan  Clinical Impression Statement Pt continues to demo right leg weakness and decreased balance on complaint surfaces. Making steady progress toward goals.  Pt will benefit from skilled therapeutic intervention in order to improve on the following deficits Abnormal gait;Decreased coordination;Difficulty walking;Impaired tone;Impaired UE functional use;Decreased endurance;Decreased activity tolerance;Decreased knowledge of precautions;Impaired perceived functional ability;Decreased balance;Decreased mobility;Decreased strength  Rehab Potential Good  Clinical Impairments Affecting Rehab Potential current dense paresis of right UE  PT Frequency 2x / week  PT Duration 8 weeks  PT Treatment/Interventions Gait training;Neuromuscular re-education;Balance training;Therapeutic exercise;Therapeutic activities;Functional mobility training;Patient/family education;Manual techniques;Vestibular;Stair training  PT Next Visit Plan Dynamic gait/balance over compliant surfaces;continue lower extremity strengthening  PT Home Exercise Plan Strength/balance HEP.  Consulted and Agree with Plan of Care Patient  Family Member Consulted spouse-[name]     Problem List Patient Active Problem List   Diagnosis Date Noted  . HLD (hyperlipidemia)   . CVA (cerebral infarction) 11/23/2014  . Stroke 11/23/2014  . Hypertension 11/23/2014    Willow Ora 01/05/2015, 10:30 AM  Willow Ora, PTA, [facility] [address], [place], [place] [address] [phone] 01/05/2015, 10:30 AM

## 2015-01-05 NOTE — Therapy (Signed)
[facility] [address] [place], [place], [address] Phone: [phone]   Fax:  [phone]  Speech Language Pathology Treatment  Patient Details  Name: Phillip Curry MRN: [id number] Date of Birth: May 15, 1955 Referring Provider:  Josetta Huddle, MD  Encounter Date: 01/05/2015      End of Session - 01/05/15 1207    Visit Number 10   Number of Visits 24   Date for SLP Re-Evaluation 01/26/15   SLP Start Time 1103   SLP Stop Time  1145   SLP Time Calculation (min) 42 min      Past Medical History  Diagnosis Date  . Hypertension   . Stroke     Past Surgical History  Procedure Laterality Date  . No past surgeries      There were no vitals filed for this visit.  Visit Diagnosis: Cognitive communication deficit      Subjective Assessment - 01/05/15 1102    Subjective "He gave me some sentences to  do slow and big"   Patient is accompained by: Family member               ADULT SLP TREATMENT - 01/05/15 1109    General Information   Behavior/Cognition Pleasant mood;Cooperative;Requires cueing   Treatment Provided   Treatment provided Cognitive-Linquistic   Cognitive-Linquistic Treatment   Treatment focused on Dysarthria;Cognition   Skilled Treatment Mildly complex check book balancing with use of calculator to complete math - pt initially required mod A to enter amounts in the correct order and to check use of decimal point. Pt id'd errors with occasional min cues. As activity progressed - pt continued to require extended time, with cues reduced to occasional min cues to enter math problem correctly into calculator. Pt utlized slow rate and overarticulation with structured tasks of similiarities with usual min cues due to the abstract nature of task and some word finding diffuclties noted. Speech is intellgibile at this level with supervision cues..    Assessment / Recommendations / Plan   Plan Continue  with current plan of care   Progression Toward Goals   Progression toward goals Progressing toward goals          SLP Education - 01/05/15 1144    Education provided Yes   Person(s) Educated Patient;Spouse   Methods Explanation;Demonstration   Comprehension Verbalized understanding          SLP Short Term Goals - 01/05/15 1207    SLP SHORT TERM GOAL #1   Title Pt will perform dysarthria HEP with rare minimal assistance   Time 1   Period Weeks   Status Achieved   SLP SHORT TERM GOAL #2   Title Pt will solve simple time, money, reasoning problems with 85% accuracy and occasional min A   Time 1   Period Weeks   Status Not Met   SLP SHORT TERM GOAL #3   Title Pt will manage meds at home with compensations with rare min A over 3 sessions as reported by spouse/pt.   Time 1   Period Weeks   Status Achieved          SLP Long Term Goals - 01/05/15 1207    SLP LONG TERM GOAL #1   Title Pt will be 95% intelligible in moderately complex conversation over 12 minutes   Time 5   Period Weeks   Status On-going   SLP LONG TERM GOAL #2   Title Pt will solve moderately complex time, money  reasoning problems with 85% accuracy and rare min A   Time 5   Period Weeks   Status On-going   SLP LONG TERM GOAL #3   Title Pt will utilize compensations for schedule, daily chores, lists 2x during therapy session over 2 sessions with rare min A   Time 5   Period Weeks   Status On-going          Plan - 01/05/15 1206    Clinical Impression Statement Pt cont to perform HEP for dysarthria. Simple math presented auditorily is best augmented with written cues otherwise pt has little success. Skilled ST needs to cont to decr caregiver burden and improve cognitive and speech skills closer to premorbid levels.   Speech Therapy Frequency 2x / week   Treatment/Interventions Oral motor exercises;Compensatory strategies;Patient/family education;Functional tasks;Cognitive  reorganization;Internal/external aids;SLP instruction and feedback   Potential to Achieve Goals Good   Potential Considerations Ability to learn/carryover information   Consulted and Agree with Plan of Care Patient;Family member/caregiver        Problem List Patient Active Problem List   Diagnosis Date Noted  . HLD (hyperlipidemia)   . CVA (cerebral infarction) 11/23/2014  . Stroke 11/23/2014  . Hypertension 11/23/2014    Kashish Yglesias, Annye Rusk MS, CCC-SLP 01/05/2015, 12:08 PM  [facility] [address] [place], [place], [address] Phone: [phone]   Fax:  [phone]

## 2015-01-05 NOTE — Therapy (Signed)
[facility] [address] [place], [place], [address] Phone: [phone]   Fax:  [phone]  Occupational Therapy Treatment  Patient Details  Name: Phillip Curry MRN: [id number] Date of Birth: 18-Jan-1956 Referring Provider:  Josetta Huddle, MD  Encounter Date: 01/05/2015      OT End of Session - 01/05/15 1139    Visit Number 10   Number of Visits 17   Date for OT Re-Evaluation 02/05/15   Authorization Type BCBS   Authorization - Number of Visits 40   OT Start Time 0930   OT Stop Time 1015   OT Time Calculation (min) 45 min   Equipment Utilized During Treatment estim   Activity Tolerance Patient tolerated treatment well      Past Medical History  Diagnosis Date  . Hypertension   . Stroke     Past Surgical History  Procedure Laterality Date  . No past surgeries      There were no vitals filed for this visit.  Visit Diagnosis:  Hemiplegia affecting right dominant side  Weakness due to cerebrovascular accident      Subjective Assessment - 01/05/15 0938    Subjective  Haven't had anymore sh. pain   Pertinent History see Epic snapshot   Patient Stated Goals to get better   Currently in Pain? No/denies                      OT Treatments/Exercises (OP) - 01/05/15 0001    Neurological Re-education Exercises   Other Exercises 1 AA/ROM seated using ball along diagonal surface RUE with min facilitation for control and with fatigue. BUE AA/ROM high range sh. flexion (closed chain). Supine: BUE AA/ROM for high range sh. flexion using ball for control/alignment with min assist only for hand placement Rt hand.    Other Exercises 2 Prone: scapula retraction bilaterally for posterior sh. girdle stabalization. Pt required min facilitation and tactile cues Rt scapula. Progressed to sh. extension over EOB RUE with mod assist to perform and prevent compensations.    Other Weight-Bearing Exercises 1 Seated:  bridging over RUE x 10 with min cues to perform correctly   Other Weight-Bearing Exercises 2  Supine: propped on elbows, disengaging LUE for reaching to encourage RUE scapula retraction and depression   Electrical Stimulation   Electrical Stimulation Location dorsal forearm   Electrical Stimulation Action wrist/finger extension   Electrical Stimulation Parameters previous parameters, x 10 minutes at end of session                  OT Short Term Goals - 12/27/14 1145    OT SHORT TERM GOAL #1   Title I with HEP   Baseline 01/07/15   Time 4   Period Weeks   Status Achieved   OT SHORT TERM GOAL #2   Title Pt will perform bathing with min A   Time 4   Period Weeks   Status Achieved   OT SHORT TERM GOAL #3   Title Pt will perform dressing with supervision/ setup   Time 4   Period Weeks   Status Achieved  except tying shoes (pt leaves tied. O.T. shown alternative options/AE)   OT SHORT TERM GOAL #4   Title Pt will use RUE as a stabilizer with minA   Time 4   Period Weeks   Status On-going   OT SHORT TERM GOAL #5   Title Pt will demonstrate 50% A/ROM elbow flexion/ extension.  Time 4   Period Weeks   Status Achieved   OT SHORT TERM GOAL #6   Title Pt will be modified independent with RUE positioning to minimize pain, risk for injury including splint wear PRN.   Time 4   Period Weeks   Status Achieved           OT Long Term Goals - 12/22/14 1110    OT LONG TERM GOAL #1   Title Pt will use RUE as a stabilizer/ gross A for ADLs at least 25% of the time.   Baseline due 02/05/15   Time 8   Period Weeks   Status New   OT LONG TERM GOAL #2   Title Pt will demonstrate 30* shoulder flexion/ scaption in prep for functional reach.   Time 8   Period Weeks   Status New   OT LONG TERM GOAL #3   Title Pt will perform all basic ADLs with distant supervision.   Time 8   Period Weeks   Status New   OT LONG TERM GOAL #4   Title Pt will perfom simple snack prep/ light  home management with supervision.   Time 8   Period Weeks   Status New               Plan - 01/05/15 1147    Clinical Impression Statement Pt continues to make progress with higher closed chain ROM RUE today and greater finger extension. Pt very motivated and works very hard every treatment session.    Plan continue neur re-educ., UBE   OT Home Exercise Plan 12/12/14: Initial HEP and bed positioning handout, A/E recommendations.    Consulted and Agree with Plan of Care Patient;Family member/caregiver   Family Member Consulted wife [name]        Problem List Patient Active Problem List   Diagnosis Date Noted  . HLD (hyperlipidemia)   . CVA (cerebral infarction) 11/23/2014  . Stroke 11/23/2014  . Hypertension 11/23/2014    Carey Bullocks, OTR/L 01/05/2015, 11:48 AM  [facility] [address] [place], [place], [address] Phone: [phone]   Fax:  [phone]

## 2015-01-05 NOTE — Patient Instructions (Signed)
Homework provided 

## 2015-01-09 NOTE — Telephone Encounter (Signed)
Patient has appt on 01-23-15 with Dr. Erlinda Hong schedule for follow up.

## 2015-01-10 ENCOUNTER — Ambulatory Visit: Payer: BLUE CROSS/BLUE SHIELD | Admitting: Occupational Therapy

## 2015-01-10 ENCOUNTER — Encounter: Payer: Self-pay | Admitting: Occupational Therapy

## 2015-01-10 ENCOUNTER — Telehealth: Payer: Self-pay

## 2015-01-10 ENCOUNTER — Ambulatory Visit: Payer: BLUE CROSS/BLUE SHIELD

## 2015-01-10 DIAGNOSIS — R269 Unspecified abnormalities of gait and mobility: Secondary | ICD-10-CM

## 2015-01-10 DIAGNOSIS — G8191 Hemiplegia, unspecified affecting right dominant side: Secondary | ICD-10-CM

## 2015-01-10 DIAGNOSIS — IMO0002 Reserved for concepts with insufficient information to code with codable children: Secondary | ICD-10-CM

## 2015-01-10 DIAGNOSIS — R41841 Cognitive communication deficit: Secondary | ICD-10-CM

## 2015-01-10 DIAGNOSIS — I69322 Dysarthria following cerebral infarction: Secondary | ICD-10-CM

## 2015-01-10 NOTE — Patient Instructions (Signed)
  Please complete the assigned speech therapy homework and return it to your next session.  

## 2015-01-10 NOTE — Therapy (Signed)
[facility] [address] [place], [place], [address] Phone: [phone]   Fax:  [phone]  Speech Language Pathology Treatment  Patient Details  Name: Phillip Curry MRN: [id number] Date of Birth: 04/24/1955 Referring Provider:  Gates, Robert, MD  Encounter Date: 01/10/2015      End of Session - 01/10/15 1700    Visit Number 11   Number of Visits 24   Date for SLP Re-Evaluation 01/26/15   SLP Start Time 1148   SLP Stop Time  1230   SLP Time Calculation (min) 42 min   Activity Tolerance Patient tolerated treatment well      Past Medical History  Diagnosis Date  . Hypertension   . Stroke     Past Surgical History  Procedure Laterality Date  . No past surgeries      There were no vitals filed for this visit.  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   Patient is accompained by: --  wife               ADULT SLP TREATMENT - 01/10/15 1153    General Information   Behavior/Cognition Pleasant mood;Cooperative;Requires cueing   Treatment Provided   Treatment provided Cognitive-Linquistic   Cognitive-Linquistic Treatment   Treatment focused on Dysarthria;Cognition   Skilled Treatment SLP facilitated pt's attention and problem solving skills with a min complex written directions (not obvious manipulations to pictures/figures). SLP needed to provide pt with min-mod cues for attention with extra time needed consistently. Pt exhibited reduced problem solving skills as he was not able to tell SLP strategy for decr'd alternating attention.   Assessment / Recommendations / Plan   Plan Continue with current plan of care   Progression Toward Goals   Progression toward goals Progressing toward goals            SLP Short Term Goals - 01/05/15 1207     SLP SHORT TERM GOAL #1   Title Pt will perform dysarthria HEP with rare minimal assistance   Time 1   Period Weeks   Status Achieved   SLP SHORT TERM GOAL #2   Title Pt will solve simple time, money, reasoning problems with 85% accuracy and occasional min A   Time 1   Period Weeks   Status Not Met   SLP SHORT TERM GOAL #3   Title Pt will manage meds at home with compensations with rare min A over 3 sessions as reported by spouse/pt.   Time 1   Period Weeks   Status Achieved          SLP Long Term Goals - 01/10/15 1701    SLP LONG TERM GOAL #1   Title Pt will be 95% intelligible in moderately complex conversation over 12 minutes   Time 4   Period Weeks   Status On-going   SLP LONG TERM GOAL #2   Title Pt will solve moderately complex time, money reasoning problems with 85% accuracy and rare min A   Time 4   Period Weeks   Status On-going   SLP LONG TERM GOAL #3   Title Pt will utilize compensations for schedule, daily chores, lists 2x during therapy session over 2 sessions with rare min A   Time 4   Period Weeks   Status On-going            Plan - 01/10/15 1700    Clinical Impression Statement Attention and problem solving noted today as cont'd deficit areas. Skilled ST needs to cont to decr caregiver burden and improve cognitive and speech skills closer to premorbid levels.   Speech Therapy Frequency 2x / week   Duration 4 weeks   Treatment/Interventions Oral motor exercises;Compensatory strategies;Patient/family education;Functional tasks;Cognitive reorganization;Internal/external aids;SLP instruction and feedback   Potential to Achieve Goals Good   Potential Considerations Ability to learn/carryover information        Problem List Patient Active Problem List   Diagnosis Date Noted  . HLD (hyperlipidemia)   . CVA (cerebral infarction) 11/23/2014  . Stroke 11/23/2014  . Hypertension 11/23/2014    SCHINKE,CARL , MS, CCC-SLP  01/10/2015, 5:02 PM  [facility] [address] [place], [place], [address] Phone: [phone]   Fax:  [phone]     

## 2015-01-10 NOTE — Telephone Encounter (Signed)
I spoke to the patient's spouse in the [employer] trial, and she stated that they are still considering whether to participate. They will call back if interested.

## 2015-01-10 NOTE — Therapy (Signed)
[facility] [address] [place], [place], [address] Phone: [phone]   Fax:  [phone]  Occupational Therapy Treatment  Patient Details  Name: Phillip Curry MRN: [id number] Date of Birth: November 05, 1955 Referring Provider:  Josetta Huddle, MD  Encounter Date: 01/10/2015      OT End of Session - 01/10/15 1151    Visit Number 11   Number of Visits 17   Date for OT Re-Evaluation 02/05/15   Authorization Type BCBS   OT Start Time 1100   OT Stop Time 1145   OT Time Calculation (min) 45 min   Equipment Utilized During Treatment UBE   Activity Tolerance Patient tolerated treatment well      Past Medical History  Diagnosis Date  . Hypertension   . Stroke     Past Surgical History  Procedure Laterality Date  . No past surgeries      There were no vitals filed for this visit.  Visit Diagnosis:  Hemiplegia affecting right dominant side  Weakness due to cerebrovascular accident      Subjective Assessment - 01/10/15 1105    Patient is accompained by: Family member   Pertinent History see Epic snapshot   Patient Stated Goals to get better   Currently in Pain? No/denies                      OT Treatments/Exercises (OP) - 01/10/15 0001    Exercises   Exercises --  UBE x 5 min. level 2 (Rt hand wrapped)   Neurological Re-education Exercises   Scapular Stabilization Supine;Prone;Seated  in wt. bearing (supine, seated), scapula retraction prone   Other Exercises 1 body on arm and arm on body movements with pain initially during horizontal abduction, but after v.c's and tactile cues, pt could resolve pain with proper technique (scapula retraction). AA/ROM along diagonal surface with ball for control, followed by high range sh. flexion in AA/ROM with UE Ranger at wall   Other Exercises 2 Supine: AA/ROM BUE's with ball for control/alignment RUE with only occasional assist for hand placement. Prone:  scapula retraction bilaterally with only min facilitation RT side. Progressed to scapula retraction with sh. extension RUE over EOB with min to mod assist to achieve full movement and to prevent IR. Standing over physioball: bilateral scapula retraction with horizontal abduction (modified with bent arms) and mod to max assist RUE.    Other Weight-Bearing Exercises 1 Seated: bridging over RUE x 10 with min cues to perform correctly   Other Weight-Bearing Exercises 2  Supine: propped on elbows, disengaging LUE for reaching to encourage RUE scapula retraction and depression                  OT Short Term Goals - 12/27/14 1145    OT SHORT TERM GOAL #1   Title I with HEP   Baseline 01/07/15   Time 4   Period Weeks   Status Achieved   OT SHORT TERM GOAL #2   Title Pt will perform bathing with min A   Time 4   Period Weeks   Status Achieved   OT SHORT TERM GOAL #3   Title Pt will perform dressing with supervision/ setup   Time 4   Period Weeks   Status Achieved  except tying shoes (pt leaves tied. O.T. shown alternative options/AE)   OT SHORT TERM GOAL #4   Title Pt will use RUE as a stabilizer with minA   Time 4  Period Weeks   Status On-going   OT SHORT TERM GOAL #5   Title Pt will demonstrate 50% A/ROM elbow flexion/ extension.   Time 4   Period Weeks   Status Achieved   OT SHORT TERM GOAL #6   Title Pt will be modified independent with RUE positioning to minimize pain, risk for injury including splint wear PRN.   Time 4   Period Weeks   Status Achieved           OT Long Term Goals - 12/22/14 1110    OT LONG TERM GOAL #1   Title Pt will use RUE as a stabilizer/ gross A for ADLs at least 25% of the time.   Baseline due 02/05/15   Time 8   Period Weeks   Status New   OT LONG TERM GOAL #2   Title Pt will demonstrate 30* shoulder flexion/ scaption in prep for functional reach.   Time 8   Period Weeks   Status New   OT LONG TERM GOAL #3   Title Pt will  perform all basic ADLs with distant supervision.   Time 8   Period Weeks   Status New   OT LONG TERM GOAL #4   Title Pt will perfom simple snack prep/ light home management with supervision.   Time 8   Period Weeks   Status New               Plan - 01/10/15 1152    Clinical Impression Statement Pt demo ability today to begin higher AA/ROM in sh. flexion and arm on body movements.    Plan continue NMR, estim   OT Home Exercise Plan 12/12/14: Initial HEP and bed positioning handout, A/E recommendations.    Consulted and Agree with Plan of Care Patient;Family member/caregiver   Family Member Consulted wife [name]        Problem List Patient Active Problem List   Diagnosis Date Noted  . HLD (hyperlipidemia)   . CVA (cerebral infarction) 11/23/2014  . Stroke 11/23/2014  . Hypertension 11/23/2014    Carey Bullocks, OTR/L 01/10/2015, 11:54 AM  [facility] [address] [place], [place], [address] Phone: [phone]   Fax:  [phone]

## 2015-01-10 NOTE — Therapy (Signed)
[facility] [address] [place], [place], [address] Phone: [phone]   Fax:  [phone]  Physical Therapy Treatment  Patient Details  Name: Phillip Curry MRN: [id number] Date of Birth: 1955/11/05 Referring Provider:  Josetta Huddle, MD  Encounter Date: 01/10/2015      PT End of Session - 01/10/15 1307    Visit Number 12   Number of Visits 20   Date for PT Re-Evaluation 02/03/15   Authorization Type BCBS   PT Start Time 1017   PT Stop Time 1057   PT Time Calculation (min) 40 min   Equipment Utilized During Treatment Gait belt   Activity Tolerance Patient tolerated treatment well   Behavior During Therapy [facility] for tasks assessed/performed      Past Medical History  Diagnosis Date  . Hypertension   . Stroke     Past Surgical History  Procedure Laterality Date  . No past surgeries      There were no vitals filed for this visit.  Visit Diagnosis:  Abnormality of gait      Subjective Assessment - 01/10/15 1020    Subjective Pt denied falls or changes since last visit.   Patient is accompained by: Family member  wife-[name]   Pertinent History relatively healthy man prior to CVA on 11/23/14   Patient Stated Goals get using my right hand good again , walking and talking better   Currently in Pain? No/denies                         [facility] Adult PT Treatment/Exercise - 01/10/15 1021           High Level Balance   High Level Balance Activities Backward walking;Direction changes;Head turns;Negotitating around obstacles;Negotiating over obstacles;Other (comment)  forward amb with and without head turns,   High Level Balance Comments At counter with blue mat and no UE support with min guard, 4x7'/activity: forward amb with and without head turns, backwards amb, all with cues for techinque and to improve R heel strike and stride length. Outside in grass: pt traversed short and tall hurdle, weaved  around and between cones x3 looking at obstacles and x3 looking straight ahead to improve proprioception; cues for technique and to improve lateral wt. shifting. Pt amb. over even terrain while tossing/catching ball with B UEs, 230', pt required min A during 1 LOB episode when he dropped ball due to decr. reaction time.  Forward/lat. wt. shifting on ramp with B LEs x5/LE while on upward/downward slope with min guard to supervision, cues for techinque.                  PT Short Term Goals - 12/27/14 1232    PT SHORT TERM GOAL #1   Title Patient will demonstate safe techinque 10/10 trials with sit to/from stand transfers to various surfaces  Target date 12/31/14   Baseline supervision verbal ques; some impulsivity   Time 4   Period Weeks   Status Achieved   PT SHORT TERM GOAL #2   Title Patient will demonstrate lowered fall risk evident by improved BERG to >45  Target date 12/31/14   Baseline 39   Time 4   Period Weeks   Status Achieved   PT SHORT TERM GOAL #3   Title Patient will be able to walk 1000' w/o loss of balance w/o use of assisted device clearing right foot with right stride.Target date 12/31/14   Baseline poor  foot clearance; limited to 200-300 feet due to fatigue   Time 4   Status Achieved           PT Long Term Goals - 12/20/14 1238    PT LONG TERM GOAL #1   Title Patient will demonstrated low fall risk evident by scoring >50 on BERG.  Target date 01/26/15   Baseline 39   Time 8   Period Weeks   Status On-going   PT LONG TERM GOAL #2   Title Patient will demonstrate ability to ambulate on varied terrain including curbs and steps independenlty w/o loss of balance and demo good safety awarness at all times for 30 minutes. Target date 01/26/15   Baseline limited by fatigue   Time 8   Period Weeks   Status On-going   PT LONG TERM GOAL #3   Title Functional gait will improve evident by being able to change direction, speed  and move laterally and backward w/o  loss of balance.Target date 01/26/15   Baseline limits movt to linear /forward movt; changing direction and speed are limited   Time 8   Period Weeks   Status On-going   PT LONG TERM GOAL #4   Title Patient will improve 15% on FOTO score to demo improved subjective perception of ability    Baseline 44   Time 8   Period Weeks   Status On-going               Plan - 01/10/15 1308    Clinical Impression Statement Pt continues to experience decrease weight shifting onto R LE, especially when fatigued. Pt demonstrated progress, as he was able to traverse obstacle course over grassy terrain with min guard and no overt LOB episodes. Continue with POC.   Pt will benefit from skilled therapeutic intervention in order to improve on the following deficits Abnormal gait;Decreased coordination;Difficulty walking;Impaired tone;Impaired UE functional use;Decreased endurance;Decreased activity tolerance;Decreased knowledge of precautions;Impaired perceived functional ability;Decreased balance;Decreased mobility;Decreased strength   Rehab Potential Good   Clinical Impairments Affecting Rehab Potential current dense paresis of right UE   PT Frequency 2x / week   PT Duration 8 weeks   PT Treatment/Interventions Gait training;Neuromuscular re-education;Balance training;Therapeutic exercise;Therapeutic activities;Functional mobility training;Patient/family education;Manual techniques;Vestibular;Stair training   PT Next Visit Plan Dynamic gait/balance over compliant surfaces;continue lower extremity strengthening   PT Home Exercise Plan Strength/balance HEP.   Consulted and Agree with Plan of Care Patient   Family Member Consulted spouse-[name]        Problem List Patient Active Problem List   Diagnosis Date Noted  . HLD (hyperlipidemia)   . CVA (cerebral infarction) 11/23/2014  . Stroke 11/23/2014  . Hypertension 11/23/2014    Miller,Jennifer L 01/10/2015, 1:10 PM  [facility] [address] [place], [place], [address] Phone: [phone]   Fax:  [phone]     Geoffry Paradise, PT,DPT 01/10/2015 1:10 PM Phone: [phone] Fax: [phone]

## 2015-01-11 ENCOUNTER — Telehealth: Payer: Self-pay

## 2015-01-11 NOTE — Telephone Encounter (Signed)
I left a message for the patient to return my call.

## 2015-01-12 ENCOUNTER — Ambulatory Visit: Payer: BLUE CROSS/BLUE SHIELD | Admitting: Speech Pathology

## 2015-01-12 ENCOUNTER — Telehealth: Payer: Self-pay

## 2015-01-12 ENCOUNTER — Ambulatory Visit: Payer: BLUE CROSS/BLUE SHIELD | Admitting: Occupational Therapy

## 2015-01-12 ENCOUNTER — Ambulatory Visit: Payer: BLUE CROSS/BLUE SHIELD

## 2015-01-12 DIAGNOSIS — R41841 Cognitive communication deficit: Secondary | ICD-10-CM

## 2015-01-12 DIAGNOSIS — IMO0002 Reserved for concepts with insufficient information to code with codable children: Secondary | ICD-10-CM

## 2015-01-12 DIAGNOSIS — R269 Unspecified abnormalities of gait and mobility: Secondary | ICD-10-CM

## 2015-01-12 DIAGNOSIS — G8191 Hemiplegia, unspecified affecting right dominant side: Secondary | ICD-10-CM | POA: Diagnosis not present

## 2015-01-12 NOTE — Therapy (Signed)
[facility] [address] [place], [place], [address] Phone: [phone]   Fax:  [phone]  Speech Language Pathology Treatment  Patient Details  Name: Phillip Curry MRN: [id number] Date of Birth: [birth year] Referring Provider:  Josetta Huddle, MD  Encounter Date: 01/12/2015      End of Session - 01/12/15 1233    Visit Number 12   Number of Visits 24   Date for SLP Re-Evaluation 01/26/15   SLP Start Time 1147   SLP Stop Time  1234   SLP Time Calculation (min) 47 min   Activity Tolerance Patient tolerated treatment well      Past Medical History  Diagnosis Date  . Hypertension   . Stroke     Past Surgical History  Procedure Laterality Date  . No past surgeries      There were no vitals filed for this visit.  Visit Diagnosis: Cognitive communication deficit      Subjective Assessment - 01/12/15 1157    Subjective "Before the stroke I could do a lot of things, now it takes me along time to think about doing a task"               ADULT SLP TREATMENT - 01/12/15 1218    General Information   Behavior/Cognition Pleasant mood;Cooperative;Requires cueing   Treatment Provided   Treatment provided Cognitive-Linquistic   Cognitive-Linquistic Treatment   Treatment focused on Dysarthria;Cognition   Skilled Treatment Generated list of chores pt can do at home to reduce caregiver burden. Facilitated ideas for helping pt remember daily tasks, such as white board and using schedule to help pt particpate in social activities he did prior to CVA. Simple math and attention to details with occasional min A for attention to detail and simple math.    Assessment / Recommendations / Plan   Plan Continue with current plan of care   Progression Toward Goals   Progression toward goals Progressing toward goals          SLP Education - 01/12/15 1231    Education provided Yes   Education Details compensations for  recall of ADLs, daily chores,  and participating in social activities   Person(s) Educated Patient;Spouse   Methods Explanation;Demonstration   Comprehension Verbalized understanding          SLP Short Term Goals - 01/05/15 1207    SLP SHORT TERM GOAL #1   Title Pt will perform dysarthria HEP with rare minimal assistance   Time 1   Period Weeks   Status Achieved   SLP SHORT TERM GOAL #2   Title Pt will solve simple time, money, reasoning problems with 85% accuracy and occasional min A   Time 1   Period Weeks   Status Not Met   SLP SHORT TERM GOAL #3   Title Pt will manage meds at home with compensations with rare min A over 3 sessions as reported by spouse/pt.   Time 1   Period Weeks   Status Achieved          SLP Long Term Goals - 01/12/15 1233    SLP LONG TERM GOAL #1   Title Pt will be 95% intelligible in moderately complex conversation over 12 minutes   Time 4   Period Weeks   Status On-going   SLP LONG TERM GOAL #2   Title Pt will solve moderately complex time, money reasoning problems with 85% accuracy and rare min A   Time 4  Period Weeks   Status On-going   SLP LONG TERM GOAL #3   Title Pt will utilize compensations for schedule, daily chores, lists 2x during therapy session over 2 sessions with rare min A   Time 4   Period Weeks   Status On-going          Plan - 01/12/15 1232    Clinical Impression Statement Attention and problem solving noted today as cont'd deficit areas. Skilled ST needs to cont to decr caregiver burden and improve cognitive and speech skills closer to premorbid levels.   Speech Therapy Frequency 2x / week   Duration 4 weeks   Treatment/Interventions Oral motor exercises;Compensatory strategies;Patient/family education;Functional tasks;Cognitive reorganization;Internal/external aids;SLP instruction and feedback   Potential to Achieve Goals Good   Potential Considerations Ability to learn/carryover information   Consulted and  Agree with Plan of Care Patient;Family member/caregiver   Family Member Consulted spouse        Problem List Patient Active Problem List   Diagnosis Date Noted  . HLD (hyperlipidemia)   . CVA (cerebral infarction) 11/23/2014  . Stroke 11/23/2014  . Hypertension 11/23/2014    Lovvorn, Annye Rusk MS, CCC-SLP 01/12/2015, 12:34 PM  [facility] [address] [place], [place], [address] Phone: [phone]   Fax:  [phone]

## 2015-01-12 NOTE — Therapy (Signed)
[facility] [address] [place], [place], [address] Phone: [phone]   Fax:  [phone]  Physical Therapy Treatment  Patient Details  Name: Phillip Curry MRN: [id number] Date of Birth: Aug 08, 1955 Referring Provider:  Josetta Huddle, MD  Encounter Date: 01/12/2015      PT End of Session - 01/12/15 1954    Visit Number 13   Number of Visits 20   Date for PT Re-Evaluation 02/03/15   Authorization Type BCBS   PT Start Time 1017   PT Stop Time 1058   PT Time Calculation (min) 41 min   Equipment Utilized During Treatment Gait belt   Activity Tolerance Patient tolerated treatment well   Behavior During Therapy [facility] for tasks assessed/performed      Past Medical History  Diagnosis Date  . Hypertension   . Stroke     Past Surgical History  Procedure Laterality Date  . No past surgeries      There were no vitals filed for this visit.  Visit Diagnosis:  Abnormality of gait  Weakness due to cerebrovascular accident      Subjective Assessment - 01/12/15 1020    Subjective Pt denied falls since last visit.    Patient is accompained by: Family member   Pertinent History relatively healthy man prior to CVA on 11/23/14   Patient Stated Goals get using my right hand good again , walking and talking better   Currently in Pain? Yes   Pain Score 2    Pain Location Shoulder   Pain Orientation Right   Pain Descriptors / Indicators Tightness   Pain Type Acute pain   Pain Onset In the past 7 days   Pain Frequency Intermittent   Aggravating Factors  rest increases tightness   Pain Relieving Factors movement                         [facility] Adult PT Treatment/Exercise - 01/12/15 0001    Ambulation/Gait   Ambulation/Gait Yes   Ambulation/Gait Assistance 5: Supervision   Ambulation/Gait Assistance Details Pt amb. with and without head turns, while naming foods according to the ABCs, pt noted to  experience decr. speed when performing cognitive tasks.    Ambulation Distance (Feet) --  230'x3   Assistive device None   Gait Pattern Step-through pattern;Decreased arm swing - right;Decreased dorsiflexion - right;Right hip hike;Lateral trunk lean to left;Decreased trunk rotation;Right genu recurvatum   Ambulation Surface Level;Indoor   High Level Balance   High Level Balance Activities Marching forwards;Backward walking;Head turns   High Level Balance Comments At counter with blue mat with bean bags placed under mat, no UE support. Cues to improve stride length, R heel strike, and weight shifting. 4x7'/activity.             Balance Exercises - 01/12/15 1948    Balance Exercises: Standing   Stepping Strategy Anterior;Posterior;Lateral  no UE support   Step Ups 6 inch  no UE support   Other Standing Exercises 6" step ups with no UE support x10 step ups and x10 step downs with R LE only, cues to improve wt. shifting.  Dot taps with R LE: an/tpost/lat directions with R LE 4x4reps. Bean bag toss on airex with B UEs (difficulty performing with R UE due to poor grip), performed with trunk rotation; cues to improve wt. shifting.   Overall Comments --  Performed with min guard to S.  PT Short Term Goals - 12/27/14 1232    PT SHORT TERM GOAL #1   Title Patient will demonstate safe techinque 10/10 trials with sit to/from stand transfers to various surfaces  Target date 12/31/14   Baseline supervision verbal ques; some impulsivity   Time 4   Period Weeks   Status Achieved   PT SHORT TERM GOAL #2   Title Patient will demonstrate lowered fall risk evident by improved BERG to >45  Target date 12/31/14   Baseline 39   Time 4   Period Weeks   Status Achieved   PT SHORT TERM GOAL #3   Title Patient will be able to walk 1000' w/o loss of balance w/o use of assisted device clearing right foot with right stride.Target date 12/31/14   Baseline poor foot clearance; limited to  200-300 feet due to fatigue   Time 4   Status Achieved           PT Long Term Goals - 12/20/14 1238    PT LONG TERM GOAL #1   Title Patient will demonstrated low fall risk evident by scoring >50 on BERG.  Target date 01/26/15   Baseline 39   Time 8   Period Weeks   Status On-going   PT LONG TERM GOAL #2   Title Patient will demonstrate ability to ambulate on varied terrain including curbs and steps independenlty w/o loss of balance and demo good safety awarness at all times for 30 minutes. Target date 01/26/15   Baseline limited by fatigue   Time 8   Period Weeks   Status On-going   PT LONG TERM GOAL #3   Title Functional gait will improve evident by being able to change direction, speed  and move laterally and backward w/o loss of balance.Target date 01/26/15   Baseline limits movt to linear /forward movt; changing direction and speed are limited   Time 8   Period Weeks   Status On-going   PT LONG TERM GOAL #4   Title Patient will improve 15% on FOTO score to demo improved subjective perception of ability    Baseline 44   Time 8   Period Weeks   Status On-going               Plan - 01/12/15 1955    Clinical Impression Statement Pt demonstrated progress, as he was able to perform high level balance activities without overt LOB and min guard to S for safety. Pt continues to experience decr. gait speed while amb. and performing cognitive tasks. Pt would continue to benefit from skliled PT to improve safety during functional mobility.   Pt will benefit from skilled therapeutic intervention in order to improve on the following deficits Abnormal gait;Decreased coordination;Difficulty walking;Impaired tone;Impaired UE functional use;Decreased endurance;Decreased activity tolerance;Decreased knowledge of precautions;Impaired perceived functional ability;Decreased balance;Decreased mobility;Decreased strength   Rehab Potential Good   Clinical Impairments Affecting Rehab Potential  current dense paresis of right UE   PT Frequency 2x / week   PT Duration 8 weeks   PT Treatment/Interventions Gait training;Neuromuscular re-education;Balance training;Therapeutic exercise;Therapeutic activities;Functional mobility training;Patient/family education;Manual techniques;Vestibular;Stair training   PT Next Visit Plan Dynamic gait/balance over compliant surfaces;continue lower extremity strengthening (R SLS)   PT Home Exercise Plan Strength/balance HEP.   Consulted and Agree with Plan of Care Patient   Family Member Consulted spouse-[name]        Problem List Patient Active Problem List   Diagnosis Date Noted  . HLD (hyperlipidemia)   .  CVA (cerebral infarction) 11/23/2014  . Stroke 11/23/2014  . Hypertension 11/23/2014    Miller,Jennifer L 01/12/2015, 7:57 PM  [facility] [address] [place], [place], [address] Phone: [phone]   Fax:  [phone]     Geoffry Paradise, PT,DPT 01/12/2015 7:57 PM Phone: [phone] Fax: [phone]

## 2015-01-12 NOTE — Therapy (Signed)
[facility] [address] [place], [place], [address] Phone: [phone]   Fax:  [phone]  Occupational Therapy Treatment  Patient Details  Name: Phillip Curry MRN: [id number] Date of Birth: 06-Feb-1956 Referring Provider:  Josetta Huddle, MD  Encounter Date: 01/12/2015      OT End of Session - 01/12/15 1116    Visit Number 12   Number of Visits 17   Date for OT Re-Evaluation 02/05/15   Authorization Type BCBS   Authorization - Number of Visits 40   OT Start Time 1110   OT Stop Time 1150   OT Time Calculation (min) 40 min   Equipment Utilized During Treatment UBE   Activity Tolerance Patient tolerated treatment well   Behavior During Therapy [facility] for tasks assessed/performed      Past Medical History  Diagnosis Date  . Hypertension   . Stroke     Past Surgical History  Procedure Laterality Date  . No past surgeries      There were no vitals filed for this visit.  Visit Diagnosis:  Hemiplegia affecting right dominant side  Lack of coordination due to stroke      Subjective Assessment - 01/12/15 1111    Subjective  Pt denies shoulder pain during session (once he began movement), Pt reports that he is trying to use RUE more    Patient is accompained by: Family member   Pertinent History see Epic snapshot   Patient Stated Goals to get better   Currently in Pain? Yes   Pain Score 2    Pain Location Shoulder   Pain Orientation Right   Pain Descriptors / Indicators Sore;Tightness   Aggravating Factors  rest increases tightness   Pain Relieving Factors movement        Neuro Re-ed:   - In supine, AAROM chest press and shoulder flex with PVC frame with occasional min facilitation and v.c. - In prone, wt. Bearing on elbows with head/chest lifts to facilitate scapular retraction/depression with min v.c.  Then, scapular retraction with shoulders positioned in UEs x10 and then in then in abduction with  elbows bent with min v.c. -In sitting, wt. Bearing through hand with lateral wt. Shift, trunk rotation and body on arm movement with min v.c. And facilitation for normal movement paterns. In sitting, functional reaching with RUE to grasp/release small cylinder objects with min facilitation and min-mod cueing for normal movement patterns.  Then, functional reaching with BUEs AAROM in various planes incorporating trunk rotation with focus on normal movement patterns with focus on symmetry with mod cueing/facilitation.                          OT Short Term Goals - 12/27/14 1145    OT SHORT TERM GOAL #1   Title I with HEP   Baseline 01/07/15   Time 4   Period Weeks   Status Achieved   OT SHORT TERM GOAL #2   Title Pt will perform bathing with min A   Time 4   Period Weeks   Status Achieved   OT SHORT TERM GOAL #3   Title Pt will perform dressing with supervision/ setup   Time 4   Period Weeks   Status Achieved  except tying shoes (pt leaves tied. O.T. shown alternative options/AE)   OT SHORT TERM GOAL #4   Title Pt will use RUE as a stabilizer with minA   Time 4   Period  Weeks   Status On-going   OT SHORT TERM GOAL #5   Title Pt will demonstrate 50% A/ROM elbow flexion/ extension.   Time 4   Period Weeks   Status Achieved   OT SHORT TERM GOAL #6   Title Pt will be modified independent with RUE positioning to minimize pain, risk for injury including splint wear PRN.   Time 4   Period Weeks   Status Achieved           OT Long Term Goals - 12/22/14 1110    OT LONG TERM GOAL #1   Title Pt will use RUE as a stabilizer/ gross A for ADLs at least 25% of the time.   Baseline due 02/05/15   Time 8   Period Weeks   Status New   OT LONG TERM GOAL #2   Title Pt will demonstrate 30* shoulder flexion/ scaption in prep for functional reach.   Time 8   Period Weeks   Status New   OT LONG TERM GOAL #3   Title Pt will perform all basic ADLs with distant  supervision.   Time 8   Period Weeks   Status New   OT LONG TERM GOAL #4   Title Pt will perfom simple snack prep/ light home management with supervision.   Time 8   Period Weeks   Status New               Plan - 01/12/15 1249    Clinical Impression Statement Pt progressing towards goals.  Pt demo improving functional reach with less compensation with cueing.   Plan continue neuro re-ed, estim   OT Home Exercise Plan 12/12/14: Initial HEP and bed positioning handout, A/E recommendations.    Consulted and Agree with Plan of Care Patient;Family member/caregiver   Family Member Consulted wife Phillip Curry        Problem List Patient Active Problem List   Diagnosis Date Noted  . HLD (hyperlipidemia)   . CVA (cerebral infarction) 11/23/2014  . Stroke 11/23/2014  . Hypertension 11/23/2014    [name] 01/12/2015, 12:53 PM  [facility] [address] [place], [place], [address] Phone: [phone]   Fax:  [place], OTR/L 01/12/2015 12:53 PM

## 2015-01-12 NOTE — Telephone Encounter (Signed)
I spoke to the patient's spouse in regards to the [employer] study. The patient is interested in participating. Therefore, a screening visit was scheduled for Thursday, 13OCT2016 at 12:30h.

## 2015-01-12 NOTE — Patient Instructions (Signed)
  Exercises  Bathe  Teeth  Breakfast  ST homework/brain activity   Add any novel chores Thai can do to help out:   Consider writing a schedule for outings, social events  Consider having [place] help out at stores, with a list or not  Wednesday night Bible study  Saturdays help clean church

## 2015-01-17 ENCOUNTER — Ambulatory Visit: Payer: BLUE CROSS/BLUE SHIELD

## 2015-01-17 ENCOUNTER — Ambulatory Visit: Payer: BLUE CROSS/BLUE SHIELD | Admitting: Occupational Therapy

## 2015-01-17 DIAGNOSIS — IMO0002 Reserved for concepts with insufficient information to code with codable children: Secondary | ICD-10-CM

## 2015-01-17 DIAGNOSIS — G8191 Hemiplegia, unspecified affecting right dominant side: Secondary | ICD-10-CM | POA: Diagnosis not present

## 2015-01-17 DIAGNOSIS — I69322 Dysarthria following cerebral infarction: Secondary | ICD-10-CM

## 2015-01-17 DIAGNOSIS — R41841 Cognitive communication deficit: Secondary | ICD-10-CM

## 2015-01-17 DIAGNOSIS — R269 Unspecified abnormalities of gait and mobility: Secondary | ICD-10-CM

## 2015-01-17 NOTE — Therapy (Signed)
[facility] [address] [place], [place], [address] Phone: [phone]   Fax:  [phone]  Physical Therapy Treatment  Patient Details  Name: Phillip Curry MRN: [id number] Date of Birth: [birth year] Referring Provider:  Josetta Huddle, MD  Encounter Date: 01/17/2015      PT End of Session - 01/17/15 2046    Visit Number 14   Number of Visits 20   Date for PT Re-Evaluation 02/03/15   Authorization Type BCBS   PT Start Time 1016   PT Stop Time 1058   PT Time Calculation (min) 42 min   Equipment Utilized During Treatment Gait belt   Activity Tolerance Patient tolerated treatment well   Behavior During Therapy [facility] for tasks assessed/performed      Past Medical History  Diagnosis Date  . Hypertension   . Stroke     Past Surgical History  Procedure Laterality Date  . No past surgeries      There were no vitals filed for this visit.  Visit Diagnosis:  Abnormality of gait  Weakness due to cerebrovascular accident      Subjective Assessment - 01/17/15 1019    Subjective Pt denied falls or changes since last visit.    Patient is accompained by: Family member  [name]-wife   Pertinent History relatively healthy man prior to CVA on 11/23/14   Patient Stated Goals get using my right hand good again , walking and talking better   Currently in Pain? No/denies                         [facility] Adult PT Treatment/Exercise - 01/17/15 1022    Ambulation/Gait   Ambulation/Gait Yes   Ambulation/Gait Assistance 5: Supervision   Ambulation/Gait Assistance Details Pt amb. over even/uneven terrain with cues to improve stride length. Pt noted to experience decr. heel strike 2/2 decr. R knee/hip flexion during gait.    Ambulation Distance (Feet) 1000 Feet  outdoors and 230' indoors   Assistive device None   Gait Pattern Step-through pattern;Decreased stride length;Decreased hip/knee flexion - right   Ambulation Surface Level;Unlevel;Paved;Outdoor;Indoor   Exercises   Exercises Knee/Hip   Knee/Hip Exercises: Machines for Strengthening   Cybex Leg Press B LE 100 lbs. with cues to improve control 3x10. R LE only at  lbs. with cues to reduce R genu recurvatum. 3x10.                PT Education - 01/17/15 2046    Education provided Yes   Education Details Encouraged pt to try going to the gym prior to PT next week, as pt might be able to d/c from PT after goal assessment next week. PT provided handout of how to progress to weight machines in gym.   Person(s) Educated Patient;Spouse   Methods Explanation;Handout   Comprehension Verbalized understanding          PT Short Term Goals - 12/27/14 1232    PT SHORT TERM GOAL #1   Title Patient will demonstate safe techinque 10/10 trials with sit to/from stand transfers to various surfaces  Target date 12/31/14   Baseline supervision verbal ques; some impulsivity   Time 4   Period Weeks   Status Achieved   PT SHORT TERM GOAL #2   Title Patient will demonstrate lowered fall risk evident by improved BERG to >45  Target date 12/31/14   Baseline 39   Time 4   Period Weeks   Status  Achieved   PT SHORT TERM GOAL #3   Title Patient will be able to walk 1000' w/o loss of balance w/o use of assisted device clearing right foot with right stride.Target date 12/31/14   Baseline poor foot clearance; limited to 200-300 feet due to fatigue   Time 4   Status Achieved           PT Long Term Goals - 12/20/14 1238    PT LONG TERM GOAL #1   Title Patient will demonstrated low fall risk evident by scoring >50 on BERG.  Target date 01/26/15   Baseline 39   Time 8   Period Weeks   Status On-going   PT LONG TERM GOAL #2   Title Patient will demonstrate ability to ambulate on varied terrain including curbs and steps independenlty w/o loss of balance and demo good safety awarness at all times for 30 minutes. Target date 01/26/15   Baseline limited  by fatigue   Time 8   Period Weeks   Status On-going   PT LONG TERM GOAL #3   Title Functional gait will improve evident by being able to change direction, speed  and move laterally and backward w/o loss of balance.Target date 01/26/15   Baseline limits movt to linear /forward movt; changing direction and speed are limited   Time 8   Period Weeks   Status On-going   PT LONG TERM GOAL #4   Title Patient will improve 15% on FOTO score to demo improved subjective perception of ability    Baseline 44   Time 8   Period Weeks   Status On-going               Plan - 01/17/15 1038    Clinical Impression Statement Pt demontstrated progress and increased strength, as he was able to tolerate increased weight on leg press. Pt continues to require cues to improve  R LE control and decr. R genu recurvatum during leg press due to weakness. Pt's occasional decr. heel strike appears to be cause be decr. R knee/hip flexion during swing phase, as pt is now able to perform R DF during amb. (without foot up brace). Continue with POC.    Pt will benefit from skilled therapeutic intervention in order to improve on the following deficits Abnormal gait;Decreased coordination;Difficulty walking;Impaired tone;Impaired UE functional use;Decreased endurance;Decreased activity tolerance;Decreased knowledge of precautions;Impaired perceived functional ability;Decreased balance;Decreased mobility;Decreased strength   Rehab Potential Good   Clinical Impairments Affecting Rehab Potential current dense paresis of right UE   PT Frequency 2x / week   PT Duration 8 weeks   PT Treatment/Interventions Gait training;Neuromuscular re-education;Balance training;Therapeutic exercise;Therapeutic activities;Functional mobility training;Patient/family education;Manual techniques;Vestibular;Stair training   PT Next Visit Plan Dynamic gait/balance over compliant surfaces;continue lower extremity strengthening (R SLS)   PT Home  Exercise Plan Strength/balance HEP.   Consulted and Agree with Plan of Care Patient   Family Member Consulted spouse-[name]        Problem List Patient Active Problem List   Diagnosis Date Noted  . HLD (hyperlipidemia)   . CVA (cerebral infarction) 11/23/2014  . Stroke 11/23/2014  . Hypertension 11/23/2014    Miller,Jennifer L 01/17/2015, 8:49 PM  [facility] [address] [place], [place], [address] Phone: [phone]   Fax:  [phone]     Geoffry Paradise, PT,DPT 01/17/2015 8:49 PM Phone: [phone] Fax: [phone]

## 2015-01-17 NOTE — Patient Instructions (Signed)
  Please complete the assigned speech therapy homework and return it to your next session.  

## 2015-01-17 NOTE — Therapy (Signed)
[facility] [address] [place], [place], [address] Phone: [phone]   Fax:  [phone]  Speech Language Pathology Treatment  Patient Details  Name: Phillip Curry MRN: [id number] Date of Birth: [birth year] Referring Lollie Gunner:  Josetta Huddle, MD  Encounter Date: 01/17/2015      End of Session - 01/17/15 1652    Visit Number 13   Number of Visits 24   Date for SLP Re-Evaluation 01/26/15   SLP Start Time 1537   SLP Stop Time  1623   SLP Time Calculation (min) 46 min   Activity Tolerance Patient tolerated treatment well      Past Medical History  Diagnosis Date  . Hypertension   . Stroke     Past Surgical History  Procedure Laterality Date  . No past surgeries      There were no vitals filed for this visit.  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   Patient is accompained by: Family member  wife               ADULT SLP TREATMENT - 01/17/15 1558    General Information   Behavior/Cognition Pleasant mood;Cooperative;Requires cueing   Treatment Provided   Treatment provided Cognitive-Linquistic   Pain Assessment   Pain Assessment No/denies pain   Cognitive-Linquistic Treatment   Treatment focused on Dysarthria;Cognition   Skilled Treatment Pt corrected homework with SLP present. Pt demo'd alternating between stimulus and figure to complete stimulus more accurately and slightly faster than last week. Pt told SLP copmensation for alternating attention, but did not use on his task. Pt recalled with notebook what he did over the weekend. Nothing for yesterday or today so SLP had pt write down details which he did with rare min A.   Assessment / Recommendations / Plan   Plan Continue with current plan of care   Progression Toward Goals   Progression toward goals Progressing toward goals            SLP Short Term Goals - 01/05/15 1207    SLP SHORT TERM GOAL #1   Title Pt will perform dysarthria HEP with rare minimal assistance   Time 1   Period Weeks   Status Achieved   SLP SHORT TERM GOAL #2   Title Pt will solve simple time, money, reasoning problems with 85% accuracy and occasional min A   Time 1   Period Weeks   Status Not Met   SLP SHORT TERM GOAL #3   Title Pt will manage meds at home with compensations with rare min A over 3 sessions as reported by spouse/pt.   Time 1   Period Weeks   Status Achieved          SLP Long Term Goals - 01/17/15 1655    SLP LONG TERM GOAL #1   Title Pt will be 95% intelligible in moderately complex conversation over 12 minutes   Time 3   Period Weeks   Status On-going   SLP LONG TERM GOAL #2   Title Pt will solve moderately complex time, money reasoning problems with 85% accuracy and rare min A   Time 3   Period Weeks   Status On-going   SLP LONG TERM GOAL #3   Title Pt will utilize compensations for schedule, daily chores, lists  2x during therapy session over 2 sessions with rare min A   Time 3   Period Weeks   Status On-going          Plan - 01/17/15 1652    Clinical Impression Statement Attention continues as area pt can improve with. Pt also cont with slurred speech. Skilled ST necessary to cont to work with pt's cognitive linguistic and speech skills to reduce cargeiver burden and maximize pt's abilities with these areas.   Speech Therapy Frequency 2x / week   Duration --  3 weeks   Treatment/Interventions Oral motor exercises;Compensatory strategies;Patient/family education;Functional tasks;Cognitive reorganization;Internal/external aids;SLP instruction and feedback   Potential to Achieve Goals Good   Potential Considerations Ability to learn/carryover information        Problem List Patient Active Problem List    Diagnosis Date Noted  . HLD (hyperlipidemia)   . CVA (cerebral infarction) 11/23/2014  . Stroke 11/23/2014  . Hypertension 11/23/2014    [facility] [place] , [place], [place]  01/17/2015, 4:56 PM  [facility] [address] [place], [place], [address] Phone: [phone]   Fax:  [phone]

## 2015-01-17 NOTE — Therapy (Signed)
[facility] [address] [place], [place], [address] Phone: [phone]   Fax:  [phone]  Occupational Therapy Treatment  Patient Details  Name: Phillip Curry MRN: [id number] Date of Birth: [birth year] Referring Provider:  Josetta Huddle, MD  Encounter Date: 01/17/2015      OT End of Session - 01/17/15 1145    Visit Number 13   Number of Visits 17   Date for OT Re-Evaluation 02/05/15   Authorization Type BCBS   Authorization - Number of Visits 40   OT Start Time 1100   OT Stop Time 1148   OT Time Calculation (min) 48 min   Equipment Utilized During Treatment estim   Activity Tolerance Patient tolerated treatment well      Past Medical History  Diagnosis Date  . Hypertension   . Stroke     Past Surgical History  Procedure Laterality Date  . No past surgeries      There were no vitals filed for this visit.  Visit Diagnosis:  Hemiplegia affecting right dominant side  Weakness due to cerebrovascular accident      Subjective Assessment - 01/17/15 1101    Subjective  Pt denies shoulder pain during session (once he began movement), Pt reports that he is trying to use RUE more    Patient is accompained by: Family member   Pertinent History see Epic snapshot   Patient Stated Goals to get better   Currently in Pain? No/denies                      OT Treatments/Exercises (OP) - 01/17/15 1139    Neurological Re-education Exercises   Scapular Stabilization Right;Prone;Seated   Other Exercises 1 Supine: AA/ROM in bilateral shoulder flexion with ball for control/alignment, followed by progressing to same activity seated to eye level with min assist RUE. AA/ROM RUE only in high level shoulder flexion with UE Ranger - pt noted with mild scapula winging when coming down   Other Exercises 2 Prone: scapula retraction bilaterally with mod tactile cues/assist for Rt scapula. Progressed to sh. extension  RUE over EOB with mod assist to prevent IR and for full motion. Progressed to low range open chain reaching to grasp/release cones with min assist for distal control and for finger extension.    Other Weight-Bearing Exercises 1 Seated: bridging over RUE x 10 with min cues to perform correctly   Other Weight-Bearing Exercises 2  Supine: propped on elbows, disengaging LUE for reaching to encourage RUE scapula retraction and depression   Electrical Stimulation   Electrical Stimulation Location dorsal forearm   Electrical Stimulation Action wrist/finger extension   Electrical Stimulation Parameters 10 on/10 off cycle, 50 rate, 250 pw, x 10 minutes   Electrical Stimulation Goals Neuromuscular facilitation                  OT Short Term Goals - 12/27/14 1145    OT SHORT TERM GOAL #1   Title I with HEP   Baseline 01/07/15   Time 4   Period Weeks   Status Achieved   OT SHORT TERM GOAL #2   Title Pt will perform bathing with min A   Time 4   Period Weeks   Status Achieved   OT SHORT TERM GOAL #3   Title Pt will perform dressing with supervision/ setup   Time 4   Period Weeks   Status Achieved  except tying shoes (pt leaves tied. O.T. shown alternative  options/AE)   OT SHORT TERM GOAL #4   Title Pt will use RUE as a stabilizer with minA   Time 4   Period Weeks   Status On-going   OT SHORT TERM GOAL #5   Title Pt will demonstrate 50% A/ROM elbow flexion/ extension.   Time 4   Period Weeks   Status Achieved   OT SHORT TERM GOAL #6   Title Pt will be modified independent with RUE positioning to minimize pain, risk for injury including splint wear PRN.   Time 4   Period Weeks   Status Achieved           OT Long Term Goals - 12/22/14 1110    OT LONG TERM GOAL #1   Title Pt will use RUE as a stabilizer/ gross A for ADLs at least 25% of the time.   Baseline due 02/05/15   Time 8   Period Weeks   Status New   OT LONG TERM GOAL #2   Title Pt will demonstrate 30*  shoulder flexion/ scaption in prep for functional reach.   Time 8   Period Weeks   Status New   OT LONG TERM GOAL #3   Title Pt will perform all basic ADLs with distant supervision.   Time 8   Period Weeks   Status New   OT LONG TERM GOAL #4   Title Pt will perfom simple snack prep/ light home management with supervision.   Time 8   Period Weeks   Status New               Plan - 01/17/15 1146    Clinical Impression Statement Pt progressing with RUE function and greater finger extension.    Plan continue NMR, quadraped with wt.shifts, UBE   OT Home Exercise Plan 12/12/14: Initial HEP and bed positioning handout, A/E recommendations.    Consulted and Agree with Plan of Care Patient;Family member/caregiver   Family Member Consulted wife [name]        Problem List Patient Active Problem List   Diagnosis Date Noted  . HLD (hyperlipidemia)   . CVA (cerebral infarction) 11/23/2014  . Stroke 11/23/2014  . Hypertension 11/23/2014    Carey Bullocks, OTR/L 01/17/2015, 11:55 AM  [facility] [address] [place], [place], [address] Phone: [phone]   Fax:  [phone]

## 2015-01-17 NOTE — Patient Instructions (Signed)
Performing 2 sets of 10 reps, if 83% as effective for strengthening as performing 3 sets of 10 reps. So, if you can't perform 3 sets due to fatigue try to at least perform 2 sets of 10 reps.   Perform 1 rep max (1RPM-the most weight you can tolerate) and then reduce the weight to 75% of 1 rep max for strength training and 40-60% for endurance training. For example: 1 rep max is 100 pounds, reduce weight to 75 pounds for strength training (3 sets of 10) and 50 pounds for endurance training (2-3 sets of 20 reps).

## 2015-01-19 ENCOUNTER — Encounter: Payer: Self-pay | Admitting: Occupational Therapy

## 2015-01-19 ENCOUNTER — Ambulatory Visit: Payer: BLUE CROSS/BLUE SHIELD

## 2015-01-19 ENCOUNTER — Ambulatory Visit: Payer: BLUE CROSS/BLUE SHIELD | Admitting: Occupational Therapy

## 2015-01-19 DIAGNOSIS — G8191 Hemiplegia, unspecified affecting right dominant side: Secondary | ICD-10-CM | POA: Diagnosis not present

## 2015-01-19 DIAGNOSIS — I69322 Dysarthria following cerebral infarction: Secondary | ICD-10-CM

## 2015-01-19 DIAGNOSIS — IMO0002 Reserved for concepts with insufficient information to code with codable children: Secondary | ICD-10-CM

## 2015-01-19 DIAGNOSIS — R41841 Cognitive communication deficit: Secondary | ICD-10-CM

## 2015-01-19 DIAGNOSIS — R269 Unspecified abnormalities of gait and mobility: Secondary | ICD-10-CM

## 2015-01-19 NOTE — Patient Instructions (Signed)
  Please complete the assigned speech therapy homework and return it to your next session.  

## 2015-01-19 NOTE — Therapy (Signed)
[facility] [address] [place], [place], [address] Phone: [phone]   Fax:  [phone]  Speech Language Pathology Treatment  Patient Details  Name: Phillip Curry MRN: [id number] Date of Birth: [birth year] Referring Provider:  Josetta Huddle, MD  Encounter Date: 01/19/2015      End of Session - 01/19/15 1155    Visit Number 14   Number of Visits 24   Date for SLP Re-Evaluation 01/26/15   SLP Start Time 1103   SLP Stop Time  1147   SLP Time Calculation (min) 44 min   Activity Tolerance Patient tolerated treatment well      Past Medical History  Diagnosis Date  . Hypertension   . Stroke     Past Surgical History  Procedure Laterality Date  . No past surgeries      There were no vitals filed for this visit.  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   Patient is accompained by: Family member  wife               ADULT SLP TREATMENT - 01/19/15 1105    General Information   Behavior/Cognition Pleasant mood;Cooperative;Requires cueing   HPI Switch HEP to /^p^/ , etc. if rate does not adequately slow in tongue twisters/rhyming sentences. Pt with normally fast rate.   Treatment Provided   Treatment provided Cognitive-Linquistic   Pain Assessment   Pain Assessment No/denies pain   Cognitive-Linquistic Treatment   Treatment focused on Dysarthria;Cognition   Skilled Treatment SLP facilitated pt practice with dysarthria HEP. Pt with slow rate and exaggerated speech approx 85% of the time. In tasks requiring sustained attention/problem solving ("How  long would it take you to say 50 sentences at 10 seconds a piece?") pt req'd total A. Pt did not accurately assess his speech percentage of slow  and exaggerated speech in a 6 minute conversation, demonstrating reduced sustained/selective attention.   Assessment / Recommendations / Plan   Plan Continue with current plan of care   Progression Toward Goals   Progression toward goals Progressing toward goals            SLP Short Term Goals - 01/05/15 1207    SLP SHORT TERM GOAL #1   Title Pt will perform dysarthria HEP with rare minimal assistance   Time 1   Period Weeks   Status Achieved   SLP SHORT TERM GOAL #2   Title Pt will solve simple time, money, reasoning problems with 85% accuracy and occasional min A   Time 1   Period Weeks   Status Not Met   SLP SHORT TERM GOAL #3   Title Pt will manage meds at home with compensations with rare min A over 3 sessions as reported by spouse/pt.   Time 1   Period Weeks   Status Achieved          SLP Long Term Goals - 01/19/15 1155    SLP LONG TERM GOAL #1   Title Pt will be 95% intelligible in moderately complex conversation over 12 minutes   Time 3   Period Weeks   Status On-going   SLP LONG TERM GOAL #2   Title Pt will solve moderately complex time, money reasoning problems with 80% accuracy and occasional min  A   Time 3   Period Weeks   Status Revised   SLP LONG TERM GOAL #3   Title Pt will utilize compensations for schedule, daily chores, lists 2x during therapy session over 2 sessions with rare min A   Time 3   Period Weeks   Status On-going   SLP LONG TERM GOAL #4   Title pt will demo emergent awareness in cognitive linguistic tasks with mod A occasionally   Time 3   Period Weeks   Status New          Plan - 01/19/15 1155    Clinical Impression Statement Attention continues as area pt can improve with. Pt also cont with slurred speech. Skilled ST necessary to cont to work with pt's cognitive linguistic and speech skills to reduce cargeiver burden and maximize pt's abilities with these areas.   Speech Therapy Frequency 2x / week   Duration --  3 weeks    Treatment/Interventions Oral motor exercises;Compensatory strategies;Patient/family education;Functional tasks;Cognitive reorganization;Internal/external aids;SLP instruction and feedback   Potential to Achieve Goals Good   Potential Considerations Ability to learn/carryover information;Severity of impairments        Problem List Patient Active Problem List   Diagnosis Date Noted  . HLD (hyperlipidemia)   . CVA (cerebral infarction) 11/23/2014  . Stroke 11/23/2014  . Hypertension 11/23/2014    [facility] , [place], [place]  01/19/2015, 11:57 AM  [facility] [address] [place], [place], [address] Phone: [phone]   Fax:  [phone]

## 2015-01-19 NOTE — Therapy (Signed)
[facility] [address] [place], [place], [address] Phone: [phone]   Fax:  [phone]  Occupational Therapy Treatment  Patient Details  Name: Phillip Curry MRN: [id number] Date of Birth: 1955/09/15 Referring Phillip Curry:  Phillip Huddle, MD  Encounter Date: 01/19/2015      OT End of Session - 01/19/15 0939    Visit Number 14   Number of Visits 17   Date for OT Re-Evaluation 02/05/15   Authorization Type BCBS   Authorization - Number of Visits 13   OT Start Time 0935   OT Stop Time 1015   OT Time Calculation (min) 40 min   Activity Tolerance Patient tolerated treatment well      Past Medical History  Diagnosis Date  . Hypertension   . Stroke     Past Surgical History  Procedure Laterality Date  . No past surgeries      There were no vitals filed for this visit.  Visit Diagnosis:  Hemiplegia affecting right dominant side  Lack of coordination due to stroke      Subjective Assessment - 01/19/15 0939    Subjective  doing ok   Patient is accompained by: Family member   Pertinent History see Epic snapshot   Patient Stated Goals to get better   Currently in Pain? No/denies          Treatment:   Neuro Re-ed: In supine, AAROM shoulder flex and chest press with PVC frame with min v.c. Occasionally.  In prone, wt. Bearing through [place] on elbows while lifting hips for incr core/scapular stability.  Shoulder extension with RUE off mat with min facilitation.  Scapular retraction x10 with shoulders in ext.  In sitting, shoulder flex to approx 90* with PVC frame for AAROM with min v.c.  Wt. Bearing on R hand with body on arm movements with min cueing for incr tricep activation.  In quadraped, forward/backward wt. Shifts with min facilitation/cueing (cat/cow positions) followed by wt. Shifts side to side for incr scapular mobility and stability and tricep activation and neuro re-ed.  Therapeutic Ex.:   7Arm bike x41min level 1 for reciprocal movements without rest with R hand wrapped.                       OT Short Term Goals - 12/27/14 1145    OT SHORT TERM GOAL #1   Title I with HEP   Baseline 01/07/15   Time 4   Period Weeks   Status Achieved   OT SHORT TERM GOAL #2   Title Pt will perform bathing with min A   Time 4   Period Weeks   Status Achieved   OT SHORT TERM GOAL #3   Title Pt will perform dressing with supervision/ setup   Time 4   Period Weeks   Status Achieved  except tying shoes (pt leaves tied. O.T. shown alternative options/AE)   OT SHORT TERM GOAL #4   Title Pt will use RUE as a stabilizer with minA   Time 4   Period Weeks   Status On-going   OT SHORT TERM GOAL #5   Title Pt will demonstrate 50% A/ROM elbow flexion/ extension.   Time 4   Period Weeks   Status Achieved   OT SHORT TERM GOAL #6   Title Pt will be modified independent with RUE positioning to minimize pain, risk for injury including splint wear PRN.   Time 4   Period Weeks  Status Achieved           OT Long Term Goals - 12/22/14 1110    OT LONG TERM GOAL #1   Title Pt will use RUE as a stabilizer/ gross A for ADLs at least 25% of the time.   Baseline due 02/05/15   Time 8   Period Weeks   Status New   OT LONG TERM GOAL #2   Title Pt will demonstrate 30* shoulder flexion/ scaption in prep for functional reach.   Time 8   Period Weeks   Status New   OT LONG TERM GOAL #3   Title Pt will perform all basic ADLs with distant supervision.   Time 8   Period Weeks   Status New   OT LONG TERM GOAL #4   Title Pt will perfom simple snack prep/ light home management with supervision.   Time 8   Period Weeks   Status New               Plan - 01/19/15 1027    Clinical Impression Statement Pt progressing towards goals iwth incr RUE control.   Plan Neuro re-ed.   OT Home Exercise Plan 12/12/14: Initial HEP and bed positioning handout, A/E recommendations.     Consulted and Agree with Plan of Care Patient;Family member/caregiver   Family Member Consulted wife Phillip Curry        Problem List Patient Active Problem List   Diagnosis Date Noted  . HLD (hyperlipidemia)   . CVA (cerebral infarction) 11/23/2014  . Stroke 11/23/2014  . Hypertension 11/23/2014    [facility] 01/19/2015, 10:28 AM  [facility] [address] [place], [place], [address] Phone: [phone]   Fax:  Mark, OTR/L 01/19/2015 10:28 AM

## 2015-01-19 NOTE — Therapy (Signed)
[facility] [address] [place], [place], [address] Phone: [phone]   Fax:  [phone]  Physical Therapy Treatment  Patient Details  Name: Phillip Curry MRN: [id number] Date of Birth: 1955-04-23 Referring Provider:  Josetta Huddle, MD  Encounter Date: 01/19/2015      PT End of Session - 01/19/15 1115    Visit Number 15   Number of Visits 20   Date for PT Re-Evaluation 02/03/15   Authorization Type BCBS   PT Start Time 1017   PT Stop Time 1058   PT Time Calculation (min) 41 min   Equipment Utilized During Treatment Gait belt   Activity Tolerance Patient tolerated treatment well   Behavior During Therapy [facility] for tasks assessed/performed      Past Medical History  Diagnosis Date  . Hypertension   . Stroke     Past Surgical History  Procedure Laterality Date  . No past surgeries      There were no vitals filed for this visit.  Visit Diagnosis:  Abnormality of gait  Weakness due to cerebrovascular accident      Subjective Assessment - 01/19/15 1019    Subjective (p) Pt denied falls or changes since last visit.    Patient is accompained by: (p) Family member  [name]-wife   Pertinent History (p) relatively healthy man prior to CVA on 11/23/14   Patient Stated Goals (p) get using my right hand good again , walking and talking better   Currently in Pain? (p) No/denies                         [facility] Adult PT Treatment/Exercise - 01/19/15 1109    Ambulation/Gait   Ambulation/Gait Yes   Ambulation/Gait Assistance 5: Supervision;4: Min guard   Ambulation/Gait Assistance Details Pt ambulated over uneven terrain outdoors while naming animals according to the alphabet, pt also amb. while performing head turns at varied gait speeds. Cues to improve R knee/hip flexion in order to improve step length.   Ambulation Distance (Feet) 1000 Feet   Assistive device None   Gait Pattern Step-through  pattern;Decreased stride length;Decreased hip/knee flexion - right   Ambulation Surface Level;Unlevel;Outdoor;Paved   Balance   Balance Assessed Yes   Dynamic Standing Balance   Dynamic Standing - Balance Support No upper extremity supported   Dynamic Standing - Level of Assistance 4: Min assist;Other (comment)  min guard   Dynamic Standing - Balance Activities Head turns;Head nods;Eyes closed;Eyes open;Lateral lean/weight shifting;Forward lean/weight shifting;Rocker board;Other (comment)  4x4 cones knock over/place upright-cues to improve wt. shift   Dynamic Standing - Comments Performed with cues to improve weight shifting. All ant/post/lat. weight shifting and static standing in midline performed with eyes open/closed. Pt required min A to maintain balalnce during head turns/nods with eyes closed.   High Level Balance   High Level Balance Activities Braiding;Side stepping;Negotiating over obstacles   High Level Balance Comments Performed on paved surfaces with min guard to min A during 2 LOB episodes. Cues to improve technique and wt. shifting and R knee/hip flexion. Quick sidestepping in B directions 3x15'/direction in mini-squat position, VC's and demonstration to keep wt. in midline vs. laterally shifted to the L side.                PT Education - 01/19/15 1114    Education provided Yes   Education Details Reminded pt to tryp the gym this weekend, and to ask a Physiological scientist  for assistance on machines he is not familiar with (and that wife does not typically use) to ensure safety. Discussed potentially d/c from PT after checking goals next week.   Person(s) Educated Patient;Spouse   Methods Explanation   Comprehension Verbalized understanding          PT Short Term Goals - 12/27/14 1232    PT SHORT TERM GOAL #1   Title Patient will demonstate safe techinque 10/10 trials with sit to/from stand transfers to various surfaces  Target date 12/31/14   Baseline supervision  verbal ques; some impulsivity   Time 4   Period Weeks   Status Achieved   PT SHORT TERM GOAL #2   Title Patient will demonstrate lowered fall risk evident by improved BERG to >45  Target date 12/31/14   Baseline 39   Time 4   Period Weeks   Status Achieved   PT SHORT TERM GOAL #3   Title Patient will be able to walk 1000' w/o loss of balance w/o use of assisted device clearing right foot with right stride.Target date 12/31/14   Baseline poor foot clearance; limited to 200-300 feet due to fatigue   Time 4   Status Achieved           PT Long Term Goals - 12/20/14 1238    PT LONG TERM GOAL #1   Title Patient will demonstrated low fall risk evident by scoring >50 on BERG.  Target date 01/26/15   Baseline 39   Time 8   Period Weeks   Status On-going   PT LONG TERM GOAL #2   Title Patient will demonstrate ability to ambulate on varied terrain including curbs and steps independenlty w/o loss of balance and demo good safety awarness at all times for 30 minutes. Target date 01/26/15   Baseline limited by fatigue   Time 8   Period Weeks   Status On-going   PT LONG TERM GOAL #3   Title Functional gait will improve evident by being able to change direction, speed  and move laterally and backward w/o loss of balance.Target date 01/26/15   Baseline limits movt to linear /forward movt; changing direction and speed are limited   Time 8   Period Weeks   Status On-going   PT LONG TERM GOAL #4   Title Patient will improve 15% on FOTO score to demo improved subjective perception of ability    Baseline 44   Time 8   Period Weeks   Status On-going               Plan - 01/19/15 1115    Clinical Impression Statement Pt demonstrated progress as he was able to amb. at a consistent speed while performing cognitvie tasks. Pt continues to require cues to improve R knee/hip flexion and reduce R UE flexion during gait. PT assessed pt's B hip adductor flexibility, as pt noted to occasionally  adduct R LE during gait, but flexibility was WNL. Continue with POC.   Pt will benefit from skilled therapeutic intervention in order to improve on the following deficits Abnormal gait;Decreased coordination;Difficulty walking;Impaired tone;Impaired UE functional use;Decreased endurance;Decreased activity tolerance;Decreased knowledge of precautions;Impaired perceived functional ability;Decreased balance;Decreased mobility;Decreased strength   Rehab Potential Good   Clinical Impairments Affecting Rehab Potential current dense paresis of right UE   PT Frequency 2x / week   PT Duration 8 weeks   PT Treatment/Interventions Gait training;Neuromuscular re-education;Balance training;Therapeutic exercise;Therapeutic activities;Functional mobility training;Patient/family education;Manual techniques;Vestibular;Stair training   PT Next Visit  Plan Begin to assess LTGs.   PT Home Exercise Plan Strength/balance HEP.   Consulted and Agree with Plan of Care Patient   Family Member Consulted spouse-[name]        Problem List Patient Active Problem List   Diagnosis Date Noted  . HLD (hyperlipidemia)   . CVA (cerebral infarction) 11/23/2014  . Stroke 11/23/2014  . Hypertension 11/23/2014    Miller,Jennifer L 01/19/2015, 11:18 AM  [facility] [address] [place], [place], [address] Phone: [phone]   Fax:  [phone]     Geoffry Paradise, PT,DPT 01/19/2015 11:18 AM Phone: [phone] Fax: [phone]

## 2015-01-23 ENCOUNTER — Ambulatory Visit (INDEPENDENT_AMBULATORY_CARE_PROVIDER_SITE_OTHER): Payer: BLUE CROSS/BLUE SHIELD | Admitting: Neurology

## 2015-01-23 ENCOUNTER — Encounter: Payer: Self-pay | Admitting: Neurology

## 2015-01-23 VITALS — BP 115/77 | HR 81 | Ht 69.0 in | Wt 191.6 lb

## 2015-01-23 DIAGNOSIS — I10 Essential (primary) hypertension: Secondary | ICD-10-CM | POA: Insufficient documentation

## 2015-01-23 DIAGNOSIS — I63311 Cerebral infarction due to thrombosis of right middle cerebral artery: Secondary | ICD-10-CM

## 2015-01-23 DIAGNOSIS — E785 Hyperlipidemia, unspecified: Secondary | ICD-10-CM | POA: Diagnosis not present

## 2015-01-23 DIAGNOSIS — G4733 Obstructive sleep apnea (adult) (pediatric): Secondary | ICD-10-CM | POA: Insufficient documentation

## 2015-01-23 NOTE — Patient Instructions (Signed)
-   continue ASA and lipitor for stroke prevention - Follow up with your primary care physician for stroke risk factor modification. Recommend maintain blood pressure goal <130/80, diabetes with hemoglobin A1c goal below 6.5% and lipids with LDL cholesterol goal below 70 mg/dL.  - check BP at home - continue PT/OT.  - will do sleep study to rule out sleep apnea - follow up in 3 months.

## 2015-01-23 NOTE — Progress Notes (Signed)
STROKE NEUROLOGY FOLLOW UP NOTE  NAME: Phillip Curry DOB: 10-Apr-1956  REASON FOR VISIT: stroke follow up HISTORY FROM: pt and wife and chart  Today we had the pleasure of seeing Phillip Curry in follow-up at our Neurology Clinic. Pt was accompanied by wife.   History Summary Phillip Curry is a 59 y.o. male with history of HTN was admitted on 11/23/14 for right arm weakness, speech difficulties, and right facial droop. MRI showed left basal ganglia and corona radiata lacunar infarct, likely due to small vessel disease. MRA unremarkable. Carotid Doppler and 2-D echo negative. LDL 145, and A1c 5.2. He was discharged on aspirin 325 and Lipitor 40 with outpatient PT/OT.  Interval History During the interval time, the patient has been doing well. No recurrent strokelike symptoms. He has PT/OT twice every week. Headpiece be visit on 01/03/2015. On valsartan and HCTZ for blood pressure, today 115/71. Wife confirms he has snoring and intermittent apnea during sleep, however better after stroke. He is continued on aspirin and Lipitor without side effect.  REVIEW OF SYSTEMS: Full 14 system review of systems performed and notable only for those listed below and in HPI above, all others are negative:  Constitutional:  Activity change Cardiovascular:  Ear/Nose/Throat:   Skin:  Eyes:   Respiratory:   Gastroitestinal:  Constipation, rectal pain Genitourinary:  Hematology/Lymphatic:   Endocrine: Cold intolerance Musculoskeletal:   Allergy/Immunology:   Neurological:  Memory loss, speech difficulty, facial drooping Psychiatric:  Sleep:   The following represents the patient's updated allergies and side effects list: No Known Allergies  The neurologically relevant items on the patient's problem list were reviewed on today's visit.  Neurologic Examination  A problem focused neurological exam (12 or more points of the single system neurologic examination, vital signs counts as 1 point, cranial  nerves count for 8 points) was performed.  Blood pressure 115/77, pulse 81, height 5\' 9"  (1.753 m), weight 191 lb 9.6 oz (86.909 kg).  General - Well nourished, well developed, in no apparent distress.  Ophthalmologic - Sharp disc margins OU..  Cardiovascular - Regular rate and rhythm.  Mental Status -  Level of arousal and orientation to time, place, and person were intact. Language including expression, naming, repetition, comprehension was assessed and found intact, but mild dysarthria. Fund of Knowledge was assessed and was intact.  Cranial Nerves II - XII - II - Visual field intact OU. III, IV, VI - Extraocular movements intact. V - Facial sensation intact bilaterally. VII - right facial droop. VIII - Hearing & vestibular intact bilaterally. X - Palate elevates symmetrically, mild dysarthria. XI - Chin turning & shoulder shrug intact bilaterally. XII - Tongue protrusion intact.  Motor Strength - The patient's strength was normal in RUE and RLE, 4/5 LUE and 5-/5 LLE and pronator drift was present on the left.  Bulk was normal and fasciculations were absent.   Motor Tone - Muscle tone was assessed at the neck and appendages and was normal.  Reflexes - The patient's reflexes were 1+ in all extremities and he had no pathological reflexes.  Sensory - Light touch, temperature/pinprick were assessed and were normal.    Coordination - The patient had normal movements in the hands and feet with no ataxia or dysmetria.  Tremor was absent.  Gait and Station - subtle hemiplegic gait on the left, decreased arm swing on the left.  Data reviewed: I personally reviewed the images and agree with the radiology interpretations.  Ct Head Wo Contrast 11/23/2014  Findings compatible with acute infarct within left basal ganglia region. No intracranial hemorrhage.   Mr Phillip Curry Head Wo Contrast 11/23/2014  1. Acute nonhemorrhagic infarct involving the posterior left lentiform nucleus and  centrum semi ovale.  2. Age advanced subcortical white matter changes bilaterally. These likely reflect the sequela of chronic microvascular ischemia.  3. Asymmetric attenuation of anterior left in 3 branch vessels. No significant proximal stenosis or occlusion is present.   CUS - Bilateral: 1-39% ICA stenosis. Vertebral artery flow is antegrade.  2D echo - - Left ventricle: The cavity size was normal. Systolic function was normal. The estimated ejection fraction was in the range of 55% to 60%. Wall motion was normal; there were no regional wall motion abnormalities. Left ventricular diastolic function parameters were normal.  Component     Latest Ref Rng 11/24/2014  Cholesterol     0 - 200 mg/dL 218 (H)  Triglycerides     <150 mg/dL 57  HDL Cholesterol     >40 mg/dL 62  Total CHOL/HDL Ratio      3.5  VLDL     0 - 40 mg/dL 11  LDL (calc)     0 - 99 mg/dL 145 (H)  Hemoglobin A1C     4.8 - 5.6 % 5.2  Mean Plasma Glucose      103    Assessment: As you may recall, he is a 59 y.o. African American male with PMH of HTN was admitted on 11/23/14 for left basal ganglia and corona radiata lacunar infarct on MRI, likely due to small vessel disease. MRA, Carotid Doppler and 2-D echo negative. LDL 145, and A1c 5.2. He was discharged on aspirin 325 and Lipitor 40 with outpatient PT/OT. During the interval time, he has been doing well, continue PT/OT. Has symptoms and signs of OSA, we will do sleep study.   Plan:  - continue ASA and lipitor for stroke prevention - Follow up with your primary care physician for stroke risk factor modification. Recommend maintain blood pressure goal <130/80, diabetes with hemoglobin A1c goal below 6.5% and lipids with LDL cholesterol goal below 70 mg/dL.  - check BP at home - continue PT/OT.  - will do sleep study to rule out sleep apnea - RTC in 3 months.  Orders Placed This Encounter  Procedures  . Ambulatory referral to Sleep Studies    Referral  Priority:  Routine    Referral Type:  Consultation    Referral Reason:  Specialty Services Required    Number of Visits Requested:  1    Meds ordered this encounter  Medications  . Probiotic Product (PROBIOTIC DAILY PO)    Sig: Take by mouth.  Marland Kitchen UNABLE TO FIND    Sig: Colon cleansninv    Patient Instructions  - continue ASA and lipitor for stroke prevention - Follow up with your primary care physician for stroke risk factor modification. Recommend maintain blood pressure goal <130/80, diabetes with hemoglobin A1c goal below 6.5% and lipids with LDL cholesterol goal below 70 mg/dL.  - check BP at home - continue PT/OT.  - will do sleep study to rule out sleep apnea - follow up in 3 months.    Rosalin Hawking, MD PhD [facility] [address], [place], [place] [address] [phone]

## 2015-01-24 ENCOUNTER — Ambulatory Visit: Payer: BLUE CROSS/BLUE SHIELD | Admitting: Occupational Therapy

## 2015-01-24 ENCOUNTER — Ambulatory Visit: Payer: BLUE CROSS/BLUE SHIELD | Admitting: Speech Pathology

## 2015-01-24 ENCOUNTER — Encounter: Payer: Self-pay | Admitting: Occupational Therapy

## 2015-01-24 ENCOUNTER — Ambulatory Visit: Payer: BLUE CROSS/BLUE SHIELD | Attending: Internal Medicine

## 2015-01-24 DIAGNOSIS — I69898 Other sequelae of other cerebrovascular disease: Secondary | ICD-10-CM | POA: Insufficient documentation

## 2015-01-24 DIAGNOSIS — M25511 Pain in right shoulder: Secondary | ICD-10-CM | POA: Insufficient documentation

## 2015-01-24 DIAGNOSIS — I6980 Unspecified sequelae of other cerebrovascular disease: Secondary | ICD-10-CM | POA: Insufficient documentation

## 2015-01-24 DIAGNOSIS — IMO0002 Reserved for concepts with insufficient information to code with codable children: Secondary | ICD-10-CM

## 2015-01-24 DIAGNOSIS — G8191 Hemiplegia, unspecified affecting right dominant side: Secondary | ICD-10-CM | POA: Insufficient documentation

## 2015-01-24 DIAGNOSIS — R279 Unspecified lack of coordination: Secondary | ICD-10-CM | POA: Insufficient documentation

## 2015-01-24 DIAGNOSIS — I69922 Dysarthria following unspecified cerebrovascular disease: Secondary | ICD-10-CM | POA: Diagnosis present

## 2015-01-24 DIAGNOSIS — R531 Weakness: Secondary | ICD-10-CM | POA: Diagnosis present

## 2015-01-24 DIAGNOSIS — R41841 Cognitive communication deficit: Secondary | ICD-10-CM

## 2015-01-24 DIAGNOSIS — R269 Unspecified abnormalities of gait and mobility: Secondary | ICD-10-CM | POA: Diagnosis present

## 2015-01-24 NOTE — Therapy (Signed)
[facility] [address] [place], [place], [address] Phone: [phone]   Fax:  [phone]  Occupational Therapy Treatment  Patient Details  Name: Phillip Curry MRN: [id number] Date of Birth: [birth year] Referring Provider:  Josetta Huddle, MD  Encounter Date: 01/24/2015      OT End of Session - 01/24/15 1131    Visit Number 15   Number of Visits 17   Date for OT Re-Evaluation 02/05/15   Authorization Type BCBS   Authorization - Number of Visits 40   OT Start Time 1020   OT Stop Time 1100   OT Time Calculation (min) 40 min   Equipment Utilized During Treatment estim   Activity Tolerance Patient tolerated treatment well      Past Medical History  Diagnosis Date  . Hypertension   . Stroke [facility])     Past Surgical History  Procedure Laterality Date  . No past surgeries      There were no vitals filed for this visit.  Visit Diagnosis:  Hemiplegia affecting right dominant side ([place])  Weakness due to cerebrovascular accident      Subjective Assessment - 01/24/15 1024    Patient is accompained by: Family member  spouse   Pertinent History see Epic snapshot   Patient Stated Goals to get better   Currently in Pain? No/denies                      OT Treatments/Exercises (OP) - 01/24/15 0001    Neurological Re-education Exercises   Other Exercises 1 Prone: scapula retraction x 10 with only occasional min tactile cues for Rt scapula. Followed by sh. extension over EOB with min assist/facilitation to prevent IR,    Other Exercises 2 Supine: AA/ROM for bilateral high range sh. flexion with ball for control/alignment; progressed to performing in seated position to eye level against gravity w/o assist!   Other Weight-Bearing Exercises 1 Seated: bridging over RUE x 10 with min cues to perform correctly   Other Weight-Bearing Exercises 2 Quadraped: A-P wt. shifts, followed by cat/cow stretch, then  progressed to disengaging LUE for forward reaching and then trunk rotation to place increased wt. on RUE. (Modified position of hands to prevent wrist pain)   Electrical Stimulation   Electrical Stimulation Location dorsal forearm   Electrical Stimulation Action wrist/finger extension   Electrical Stimulation Parameters 10 on/10 off cyce, 50 pps, 248 pw x 10 minutes at end of session (Pt had on following O.T. session)   Electrical Stimulation Goals Neuromuscular facilitation                  OT Short Term Goals - 12/27/14 1145    OT SHORT TERM GOAL #1   Title I with HEP   Baseline 01/07/15   Time 4   Period Weeks   Status Achieved   OT SHORT TERM GOAL #2   Title Pt will perform bathing with min A   Time 4   Period Weeks   Status Achieved   OT SHORT TERM GOAL #3   Title Pt will perform dressing with supervision/ setup   Time 4   Period Weeks   Status Achieved  except tying shoes (pt leaves tied. O.T. shown alternative options/AE)   OT SHORT TERM GOAL #4   Title Pt will use RUE as a stabilizer with minA   Time 4   Period Weeks   Status On-going   OT SHORT TERM GOAL #5  Title Pt will demonstrate 50% A/ROM elbow flexion/ extension.   Time 4   Period Weeks   Status Achieved   OT SHORT TERM GOAL #6   Title Pt will be modified independent with RUE positioning to minimize pain, risk for injury including splint wear PRN.   Time 4   Period Weeks   Status Achieved           OT Long Term Goals - 12/22/14 1110    OT LONG TERM GOAL #1   Title Pt will use RUE as a stabilizer/ gross A for ADLs at least 25% of the time.   Baseline due 02/05/15   Time 8   Period Weeks   Status New   OT LONG TERM GOAL #2   Title Pt will demonstrate 30* shoulder flexion/ scaption in prep for functional reach.   Time 8   Period Weeks   Status New   OT LONG TERM GOAL #3   Title Pt will perform all basic ADLs with distant supervision.   Time 8   Period Weeks   Status New   OT LONG  TERM GOAL #4   Title Pt will perfom simple snack prep/ light home management with supervision.   Time 8   Period Weeks   Status New               Plan - 01/24/15 1132    Clinical Impression Statement Pt has made progress with RUE control and increased movement against gravity open chain.    Plan Begin assessing LTG's (for renewal next week), functional open chain reaching, UBE   OT Home Exercise Plan 12/12/14: Initial HEP and bed positioning handout, A/E recommendations.    Consulted and Agree with Plan of Care Patient;Family member/caregiver   Family Member Consulted wife [name]        Problem List Patient Active Problem List   Diagnosis Date Noted  . Essential hypertension 01/23/2015  . Hyperlipidemia 01/23/2015  . OSA (obstructive sleep apnea) 01/23/2015  . HLD (hyperlipidemia)   . CVA (cerebral infarction) 11/23/2014  . Stroke ([place]) 11/23/2014  . Hypertension 11/23/2014    Carey Bullocks, OTR/L 01/24/2015, 11:34 AM  [facility] [address] [place], [place], [address] Phone: [phone]   Fax:  [phone]

## 2015-01-24 NOTE — Therapy (Signed)
[facility] [address] [place], [place], [address] Phone: [phone]   Fax:  [phone]  Speech Language Pathology Treatment  Patient Details  Name: Phillip Curry MRN: [id number] Date of Birth: 02-Aug-1955 Referring Provider:  Josetta Huddle, MD  Encounter Date: 01/24/2015      End of Session - 01/24/15 1147    Visit Number 15   Number of Visits 24   Date for SLP Re-Evaluation 01/26/15   SLP Start Time 3570   SLP Stop Time  1147   SLP Time Calculation (min) 45 min   Activity Tolerance Patient tolerated treatment well      Past Medical History  Diagnosis Date  . Hypertension   . Stroke [facility])     Past Surgical History  Procedure Laterality Date  . No past surgeries      There were no vitals filed for this visit.  Visit Diagnosis: Cognitive communication deficit      Subjective Assessment - 01/24/15 1104    Subjective "It was pretty good" re: homework    Currently in Pain? No/denies               ADULT SLP TREATMENT - 01/24/15 1105    General Information   Behavior/Cognition Pleasant mood;Cooperative;Requires cueing   Treatment Provided   Treatment provided Cognitive-Linquistic   Pain Assessment   Pain Assessment No/denies pain   Cognitive-Linquistic Treatment   Treatment focused on Dysarthria;Cognition   Skilled Treatment Facilitated problem sovling with sorting cards, naming item for given category when 2 cards matched. Pt required  extra time and rare min cues to identify item for given category. Pt continues to exhibit slow processing and some disoraganization of executive function. Problem solving, organization with planning trip - pt. required  usual min questioning cues for basic categories needed for trip (ie hotel, activities, rent a car) Pt. listed items needed to pack in suitcase for 4 day trip  with occasional min questioning cues - 80% accuracy.   Assessment / Recommendations /  Plan   Plan Continue with current plan of care   Progression Toward Goals   Progression toward goals Progressing toward goals          SLP Education - 01/24/15 1140    Education provided Yes   Person(s) Educated Patient;Spouse   Methods Explanation   Comprehension Verbalized understanding          SLP Short Term Goals - 01/24/15 1146    SLP SHORT TERM GOAL #1   Title Pt will perform dysarthria HEP with rare minimal assistance   Time 1   Period Weeks   Status Achieved   SLP SHORT TERM GOAL #2   Title Pt will solve simple time, money, reasoning problems with 85% accuracy and occasional min A   Time 1   Period Weeks   Status Not Met   SLP SHORT TERM GOAL #3   Title Pt will manage meds at home with compensations with rare min A over 3 sessions as reported by spouse/pt.   Time 1   Period Weeks   Status Achieved          SLP Long Term Goals - 01/24/15 1146    SLP LONG TERM GOAL #1   Title Pt will be 95% intelligible in moderately complex conversation over 12 minutes   Time 2   Period Weeks   Status On-going   SLP LONG TERM GOAL #2   Title Pt will solve moderately complex time,  money reasoning problems with 80% accuracy and occasional min A   Time 2   Period Weeks   Status Revised   SLP LONG TERM GOAL #3   Title Pt will utilize compensations for schedule, daily chores, lists 2x during therapy session over 2 sessions with rare min A   Time 2   Period Weeks   Status On-going   SLP LONG TERM GOAL #4   Title pt will demo emergent awareness in cognitive linguistic tasks with mod A occasionally   Time 2   Period Weeks   Status New          Plan - 01/24/15 1140    Clinical Impression Statement Continue skilled ST to maximize cognition and intelligibility,  including problem solving, thought organization for improved independence and reduce caregiver burden.   Speech Therapy Frequency 2x / week   Treatment/Interventions Oral motor exercises;Compensatory  strategies;Patient/family education;Functional tasks;Cognitive reorganization;Internal/external aids;SLP instruction and feedback   Potential to Achieve Goals Good   Potential Considerations Ability to learn/carryover information;Severity of impairments   Consulted and Agree with Plan of Care Patient;Family member/caregiver   Family Member Consulted spouse        Problem List Patient Active Problem List   Diagnosis Date Noted  . Essential hypertension 01/23/2015  . Hyperlipidemia 01/23/2015  . OSA (obstructive sleep apnea) 01/23/2015  . HLD (hyperlipidemia)   . CVA (cerebral infarction) 11/23/2014  . Stroke ([place]) 11/23/2014  . Hypertension 11/23/2014    Lovvorn, Annye Rusk MS, CCC-SLP 01/24/2015, 11:55 AM  [facility] [address] [place], [place], [address] Phone: [phone]   Fax:  [phone]

## 2015-01-24 NOTE — Therapy (Signed)
[facility] [address] [place], [place], [address] Phone: [phone]   Fax:  [phone]  Physical Therapy Treatment  Patient Details  Name: Phillip Curry MRN: [id number] Date of Birth: 02/20/1956 Referring Provider:  Gates, Robert, MD  Encounter Date: 01/24/2015      PT End of Session - 01/24/15 1311    Visit Number 16   Number of Visits 20   Date for PT Re-Evaluation 02/03/15   Authorization Type BCBS   PT Start Time 1148   PT Stop Time 1219   PT Time Calculation (min) 31 min   Equipment Utilized During Treatment Gait belt   Activity Tolerance Patient tolerated treatment well   Behavior During Therapy WFL for tasks assessed/performed      Past Medical History  Diagnosis Date  . Hypertension   . Stroke ([facility])     Past Surgical History  Procedure Laterality Date  . No past surgeries      There were no vitals filed for this visit.  Visit Diagnosis:  Abnormality of gait  Weakness due to cerebrovascular accident      Subjective Assessment - 01/24/15 1151    Subjective Pt denied falls since last visit. Pt did not go to the gym last weekend but did perform HEP.   Patient is accompained by: Family member  [name]-wife   Pertinent History relatively healthy man prior to CVA on 11/23/14   Patient Stated Goals get using my right hand good again , walking and talking better   Currently in Pain? No/denies                         [facility] Adult PT Treatment/Exercise - 01/24/15 1154    Ambulation/Gait   Ambulation/Gait Yes   Ambulation/Gait Assistance 7: Independent   Ambulation/Gait Assistance Details Pt amb. over even/uneven terrain, while performing head turns, 180 degree turns, sidestepping, and amb. at various gait speeds. No LOB episodes noted. Pt did experience decr. R knee/hip flex. during last 200' of amb. 2/2 amb.   Ambulation Distance (Feet) 1000 Feet   Assistive device None   Gait  Pattern Step-through pattern;Decreased hip/knee flexion - right   Ambulation Surface Level;Unlevel;Indoor;Outdoor;Paved;Grass;Other (comment)  mulch   Stairs Yes   Stairs Assistance 6: Modified independent (Device/Increase time)  demonstrated safe technique, required incr. time   Stair Management Technique No rails;Alternating pattern   Number of Stairs 4   Height of Stairs 6   Curb 7: Independent   Standardized Balance Assessment   Standardized Balance Assessment Berg Balance Test   Berg Balance Test   Sit to Stand Able to stand without using hands and stabilize independently   Standing Unsupported Able to stand safely 2 minutes   Sitting with Back Unsupported but Feet Supported on Floor or Stool Able to sit safely and securely 2 minutes   Stand to Sit Sits safely with minimal use of hands   Transfers Able to transfer safely, minor use of hands   Standing Unsupported with Eyes Closed Able to stand 10 seconds safely   Standing Ubsupported with Feet Together Able to place feet together independently and stand 1 minute safely   From Standing, Reach Forward with Outstretched Arm Can reach confidently >25 cm (10")  10" with L UE and 7" with R UE   From Standing Position, Pick up Object from Floor Able to pick up shoe safely and easily   From Standing Position, Turn to Look Behind   Over each Shoulder Looks behind from both sides and weight shifts well   Turn 360 Degrees Able to turn 360 degrees safely in 4 seconds or less   Standing Unsupported, Alternately Place Feet on Step/Stool Able to stand independently and safely and complete 8 steps in 20 seconds   Standing Unsupported, One Foot in Front Able to place foot tandem independently and hold 30 seconds   Standing on One Leg Able to lift leg independently and hold > 10 seconds   Total Score 56                PT Education - 01/24/15 1310    Education provided Yes   Education Details PT educated pt on the importance of progressing  to exercising at gym in order to continue to improve strength and endurance. PT suggested performing stretches every day, balance HEP 2x/week, and strengthening HEP 3-4x/week in order to maintain gains.   Person(s) Educated Patient;Spouse   Methods Explanation   Comprehension Verbalized understanding          PT Short Term Goals - 12/27/14 1232    PT SHORT TERM GOAL #1   Title Patient will demonstate safe techinque 10/10 trials with sit to/from stand transfers to various surfaces  Target date 12/31/14   Baseline supervision verbal ques; some impulsivity   Time 4   Period Weeks   Status Achieved   PT SHORT TERM GOAL #2   Title Patient will demonstrate lowered fall risk evident by improved BERG to >45  Target date 12/31/14   Baseline 39   Time 4   Period Weeks   Status Achieved   PT SHORT TERM GOAL #3   Title Patient will be able to walk 1000' w/o loss of balance w/o use of assisted device clearing right foot with right stride.Target date 12/31/14   Baseline poor foot clearance; limited to 200-300 feet due to fatigue   Time 4   Status Achieved           PT Long Term Goals - 01/24/15 1312    PT LONG TERM GOAL #1   Title Patient will demonstrated low fall risk evident by scoring >50 on BERG.  Target date 01/26/15   Baseline 39   Time 8   Period Weeks   Status Achieved   PT LONG TERM GOAL #2   Title Patient will demonstrate ability to ambulate on varied terrain including curbs and steps independenlty w/o loss of balance and demo good safety awarness at all times for 30 minutes. Target date 01/26/15   Baseline limited by fatigue   Time 8   Period Weeks   Status Achieved   PT LONG TERM GOAL #3   Title Functional gait will improve evident by being able to change direction, speed  and move laterally and backward w/o loss of balance.Target date 01/26/15   Baseline limits movt to linear /forward movt; changing direction and speed are limited   Time 8   Period Weeks   Status Achieved    PT LONG TERM GOAL #4   Title Patient will improve 15% on FOTO score to demo improved subjective perception of ability    Baseline 44   Time 8   Period Weeks   Status Deferred               Plan - 01/24/15 1312    Clinical Impression Statement Pt met all goals and is discharging from PT. Please see d/c summary for details. PT did  not have pt amb. a full 30 minutes, as pt and pt's wife report pt is able to amb. for >30 minutes at home.        Problem List Patient Active Problem List   Diagnosis Date Noted  . Essential hypertension 01/23/2015  . Hyperlipidemia 01/23/2015  . OSA (obstructive sleep apnea) 01/23/2015  . HLD (hyperlipidemia)   . CVA (cerebral infarction) 11/23/2014  . Stroke ([facility]) 11/23/2014  . Hypertension 11/23/2014    Miller,Jennifer L 01/24/2015, 1:14 PM  [facility] [address] [place], [place], [address] Phone: [phone]   Fax:  [phone]   PHYSICAL THERAPY DISCHARGE SUMMARY  Visits from Start of Care: 16  Current functional level related to goals / functional outcomes:     PT Long Term Goals - 01/24/15 1312    PT LONG TERM GOAL #1   Title Patient will demonstrated low fall risk evident by scoring >50 on BERG.  Target date 01/26/15   Baseline 39   Time 8   Period Weeks   Status Achieved   PT LONG TERM GOAL #2   Title Patient will demonstrate ability to ambulate on varied terrain including curbs and steps independenlty w/o loss of balance and demo good safety awarness at all times for 30 minutes. Target date 01/26/15   Baseline limited by fatigue   Time 8   Period Weeks   Status Achieved   PT LONG TERM GOAL #3   Title Functional gait will improve evident by being able to change direction, speed  and move laterally and backward w/o loss of balance.Target date 01/26/15   Baseline limits movt to linear /forward movt; changing direction and speed are limited   Time 8    Period Weeks   Status Achieved   PT LONG TERM GOAL #4   Title Patient will improve 15% on FOTO score to demo improved subjective perception of ability    Baseline 44   Time 8   Period Weeks   Status Deferred        Remaining deficits: R LE weakness   Education / Equipment: HEP and encouraged pt to join gym with his wife.  Plan: Patient agrees to discharge.  Patient goals were met. Patient is being discharged due to meeting the stated rehab goals.  ?????       Jennifer Miller, PT,DPT 01/24/2015 1:14 PM Phone: [phone] Fax: [phone]   

## 2015-01-24 NOTE — Patient Instructions (Signed)
Pt to complete trip intinerary, and budget for planning/organization for homework

## 2015-01-26 ENCOUNTER — Ambulatory Visit: Payer: BLUE CROSS/BLUE SHIELD

## 2015-01-26 ENCOUNTER — Ambulatory Visit: Payer: BLUE CROSS/BLUE SHIELD | Admitting: Occupational Therapy

## 2015-01-26 DIAGNOSIS — G8191 Hemiplegia, unspecified affecting right dominant side: Secondary | ICD-10-CM

## 2015-01-26 DIAGNOSIS — R41841 Cognitive communication deficit: Secondary | ICD-10-CM

## 2015-01-26 DIAGNOSIS — IMO0002 Reserved for concepts with insufficient information to code with codable children: Secondary | ICD-10-CM

## 2015-01-26 DIAGNOSIS — R269 Unspecified abnormalities of gait and mobility: Secondary | ICD-10-CM | POA: Diagnosis not present

## 2015-01-26 DIAGNOSIS — I69322 Dysarthria following cerebral infarction: Secondary | ICD-10-CM

## 2015-01-26 NOTE — Therapy (Signed)
[facility] [address] [place], [place], [address] Phone: [phone]   Fax:  [phone]  Speech Language Pathology Treatment  Patient Details  Name: Liev Brockbank MRN: [id number] Date of Birth: 05/13/1955 Referring Provider:  Josetta Huddle, MD  Encounter Date: 01/26/2015      End of Session - 01/26/15 1106    Visit Number 16   Number of Visits 24   Date for SLP Re-Evaluation 01/26/15   SLP Start Time 2   SLP Stop Time  1100   SLP Time Calculation (min) 41 min   Activity Tolerance Patient tolerated treatment well      Past Medical History  Diagnosis Date  . Hypertension   . Stroke [facility])     Past Surgical History  Procedure Laterality Date  . No past surgeries      There were no vitals filed for this visit.  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   Patient is accompained by: Family member  wife               ADULT SLP TREATMENT - 01/26/15 1025    General Information   Behavior/Cognition Pleasant mood;Cooperative;Requires cueing   Treatment Provided   Treatment provided Cognitive-Linquistic   Pain Assessment   Pain Assessment No/denies pain   Cognitive-Linquistic Treatment   Treatment focused on Cognition;Dysarthria   Skilled Treatment Pt arrived with homework (3 day vacation to [place]) with figures correct. Lingustic organization skills demo'd as deficient, requiring extra time and mod cues occasionally (Why would [place] be good place to take a family?) . Pt able to tell how to get to [place] from [place] with extra time due to extraneous commenting and extra processing time, but correct ([address] highways.   Assessment / Recommendations / Plan   Plan Continue with current plan of care   Progression Toward Goals    Progression toward goals Progressing toward goals          SLP Education - 01/26/15 1105    Education provided Yes   Education Details cueing for homework    Person(s) Educated Patient;Spouse   Methods Explanation   Comprehension Verbalized understanding;Need further instruction  pt needs further instruction          SLP Short Term Goals - 01/24/15 1146    SLP SHORT TERM GOAL #1   Title Pt will perform dysarthria HEP with rare minimal assistance   Time 1   Period Weeks   Status Achieved   SLP SHORT TERM GOAL #2   Title Pt will solve simple time, money, reasoning problems with 85% accuracy and occasional min A   Time 1   Period Weeks   Status Not Met   SLP SHORT TERM GOAL #3   Title Pt will manage meds at home with compensations with rare min A over 3 sessions as reported by spouse/pt.   Time 1   Period Weeks   Status Achieved          SLP Long Term Goals - 01/26/15 1107    SLP LONG TERM GOAL #1   Title Pt will be 90% intelligible in moderately complex conversation over 12 minutes   Time 2   Period Weeks   Status Revised   SLP LONG TERM GOAL #2   Title Pt will solve  simple time, money reasoning problems with 80% accuracy and occasional min A   Time 2   Period Weeks   Status Revised   SLP LONG TERM GOAL #3   Title Pt will utilize compensations for schedule, daily chores, lists 2x during therapy session over 2 sessions with rare min A   Time 2   Period Weeks   Status On-going   SLP LONG TERM GOAL #4   Title pt will demo emergent awareness in cognitive linguistic tasks with mod A occasionally   Time 2   Period Weeks   Status New          Plan - 01/26/15 1106    Clinical Impression Statement Continue skilled ST to maximize cognition and intelligibility,  including problem solving, thought organization for improved independence and reduce caregiver burden.   Speech Therapy Frequency 2x / week   Duration 2 weeks   Treatment/Interventions Oral motor  exercises;Compensatory strategies;Patient/family education;Functional tasks;Cognitive reorganization;Internal/external aids;SLP instruction and feedback   Potential to Achieve Goals Good   Potential Considerations Ability to learn/carryover information;Severity of impairments        Problem List Patient Active Problem List   Diagnosis Date Noted  . Essential hypertension 01/23/2015  . Hyperlipidemia 01/23/2015  . OSA (obstructive sleep apnea) 01/23/2015  . HLD (hyperlipidemia)   . CVA (cerebral infarction) 11/23/2014  . Stroke ([place]) 11/23/2014  . Hypertension 11/23/2014    [facility] , [place], [place]  01/26/2015, 11:08 AM  [facility] [address] [place], [place], [address] Phone: [phone]   Fax:  [phone]

## 2015-01-26 NOTE — Patient Instructions (Signed)
  Please complete the assigned speech therapy homework and return it to your next session.  

## 2015-01-26 NOTE — Therapy (Signed)
[facility] [address] [place], [place], [address] Phone: [phone]   Fax:  [phone]  Occupational Therapy Treatment  Patient Details  Name: Phillip Curry MRN: [id number] Date of Birth: [birth year] Referring Provider:  Josetta Huddle, MD  Encounter Date: 01/26/2015      OT End of Session - 01/26/15 1249    Visit Number 16   Number of Visits 17   Date for OT Re-Evaluation 02/05/15   Authorization Type BCBS   Authorization - Number of Visits 40   OT Start Time 1105   OT Stop Time 1146   OT Time Calculation (min) 41 min   Activity Tolerance Patient tolerated treatment well   Behavior During Therapy Phillip Curry for tasks assessed/performed      Past Medical History  Diagnosis Date  . Hypertension   . Stroke [facility])     Past Surgical History  Procedure Laterality Date  . No past surgeries      There were no vitals filed for this visit.  Visit Diagnosis:  Hemiplegia affecting right dominant side (Phillip Curry)  Lack of coordination due to stroke      Subjective Assessment - 01/26/15 1242    Subjective  Pt reports occasional R shoulder/wrist pain, no wrist pain today, R shoulder pain x2 today with malpositioning   Patient is accompained by: Family member   Pertinent History see Epic snapshot   Patient Stated Goals to get better   Currently in Pain? Yes   Pain Score 2    Pain Location Shoulder   Pain Orientation Right   Pain Descriptors / Indicators Sore;Tightness   Pain Type Acute pain   Pain Frequency Occasional   Aggravating Factors  improper positioning   Pain Relieving Factors re-positioning      Neuro Re-ed:  In prone, scapular exercises with shoulders positioned in extension x15 with min facilitation initially for positioning.   In sitting:  Wt. Bearing through hand with body on arm movements, lateral wt. Shift, and trunk rotation with min-mod cues.  AAROM shoulder flex with ball with min  facilitation/cues.  AAROM with hemiglide with min-mod facilitation/cues.  Low-range functional reaching with both UEs to grasp/release medium ball from various planes with/without trunk rotation.  Low-range functional reaching for cylinder objects with min-mod cueing for normal movement patterns.    In standing:  AAROM shoulder flex with UE ranger along diagonal for higher ranges with min verbal/tactile cues.   All with focus on normal movement patterns and proper positioning.                        OT Education - 01/26/15 1246    Education Details proper positioning with weight bearing, normal movement patterns with functional movement, importance of avoiding movement/activities that causes R shoulder/wrist pain, may need to stop and stretch during attempts for functional movement if spasticity incr, how spasticity affects movement   Person(s) Educated Patient;Spouse   Methods Explanation;Demonstration   Comprehension Verbalized understanding          OT Short Term Goals - 12/27/14 1145    OT SHORT TERM GOAL #1   Title I with HEP   Baseline 01/07/15   Time 4   Period Weeks   Status Achieved   OT SHORT TERM GOAL #2   Title Pt will perform bathing with min A   Time 4   Period Weeks   Status Achieved   OT SHORT TERM GOAL #3   Title  Pt will perform dressing with supervision/ setup   Time 4   Period Weeks   Status Achieved  except tying shoes (pt leaves tied. O.T. shown alternative options/AE)   OT SHORT TERM GOAL #4   Title Pt will use RUE as a stabilizer with minA   Time 4   Period Weeks   Status On-going   OT SHORT TERM GOAL #5   Title Pt will demonstrate 50% A/ROM elbow flexion/ extension.   Time 4   Period Weeks   Status Achieved   OT SHORT TERM GOAL #6   Title Pt will be modified independent with RUE positioning to minimize pain, risk for injury including splint wear PRN.   Time 4   Period Weeks   Status Achieved           OT Long Term  Goals - 12/22/14 1110    OT LONG TERM GOAL #1   Title Pt will use RUE as a stabilizer/ gross A for ADLs at least 25% of the time.   Baseline due 02/05/15   Time 8   Period Weeks   Status New   OT LONG TERM GOAL #2   Title Pt will demonstrate 30* shoulder flexion/ scaption in prep for functional reach.   Time 8   Period Weeks   Status New   OT LONG TERM GOAL #3   Title Pt will perform all basic ADLs with distant supervision.   Time 8   Period Weeks   Status New   OT LONG TERM GOAL #4   Title Pt will perfom simple snack prep/ light home management with supervision.   Time 8   Period Weeks   Status New               Plan - 01/26/15 1250    Clinical Impression Statement Pt continues to progress with RUE control with less compensation for low range reaching.   Plan check LTGs with renewal next week, neuro re-ed trunk/RUE   OT Home Exercise Plan 12/12/14: Initial HEP and bed positioning handout, A/E recommendations.    Consulted and Agree with Plan of Care Patient;Family member/caregiver   Family Member Consulted wife Phillip Curry        Problem List Patient Active Problem List   Diagnosis Date Noted  . Essential hypertension 01/23/2015  . Hyperlipidemia 01/23/2015  . OSA (obstructive sleep apnea) 01/23/2015  . HLD (hyperlipidemia)   . CVA (cerebral infarction) 11/23/2014  . Stroke (Phillip Curry) 11/23/2014  . Hypertension 11/23/2014    Phillip Curry 01/26/2015, 12:56 PM  [facility] [address] [place], [place], [address] Phone: [phone]   Fax:  [place], OTR/L 01/26/2015 12:56 PM

## 2015-01-31 ENCOUNTER — Ambulatory Visit: Payer: BLUE CROSS/BLUE SHIELD

## 2015-01-31 ENCOUNTER — Ambulatory Visit: Payer: BLUE CROSS/BLUE SHIELD | Admitting: Speech Pathology

## 2015-01-31 ENCOUNTER — Ambulatory Visit: Payer: BLUE CROSS/BLUE SHIELD | Admitting: Occupational Therapy

## 2015-01-31 DIAGNOSIS — IMO0002 Reserved for concepts with insufficient information to code with codable children: Secondary | ICD-10-CM

## 2015-01-31 DIAGNOSIS — R41841 Cognitive communication deficit: Secondary | ICD-10-CM

## 2015-01-31 DIAGNOSIS — G8191 Hemiplegia, unspecified affecting right dominant side: Secondary | ICD-10-CM

## 2015-01-31 DIAGNOSIS — R269 Unspecified abnormalities of gait and mobility: Secondary | ICD-10-CM | POA: Diagnosis not present

## 2015-01-31 DIAGNOSIS — I69322 Dysarthria following cerebral infarction: Secondary | ICD-10-CM

## 2015-01-31 NOTE — Patient Instructions (Addendum)
Basic Activities:   Use your affected hand to perform the following activities for 20-30 minutes 1-2 times/day.  Stop activity if you experience pain.  - Wipe table top - Bring empty plastic cup to mouth, then return to table top and release.  Use both hands with drinking with liquid in cup. - Flip playing cards - Pick up cotton balls and place in a container - Open/close cabinet door with handle - Pick up checkers and place in a container - Fold towels/wash cloths - Pick up 1-inch blocks/bottle caps - Use hand to help with bathing (hand over hand if needed), pulling up pants/socks and pulling down shirt - Use hand to eat finger foods

## 2015-01-31 NOTE — Patient Instructions (Signed)
  Speech exercises for slur (slow and big)  - 10 minutes 1x a day  Go back to oral/face exercises in front of mirror 1-2 x a day

## 2015-01-31 NOTE — Therapy (Signed)
[facility] [address] [place], [place], [address] Phone: [phone]   Fax:  [phone]  Occupational Therapy Treatment  Patient Details  Name: Phillip Curry MRN: [id number] Date of Birth: 1955/07/04 Referring Provider:  Josetta Huddle, MD  Encounter Date: 01/31/2015      OT End of Session - 01/31/15 1024    Visit Number 17   Number of Visits 33   Date for OT Re-Evaluation 04/01/15   Authorization Type BC[employer] 120 visits combined, no auth   Authorization Time Period renewal completed 01/31/15, wk 1/8   Authorization - Number of Visits 40   OT Start Time 1021   OT Stop Time 1102   OT Time Calculation (min) 41 min   Activity Tolerance Patient tolerated treatment well   Behavior During Therapy [facility] for tasks assessed/performed      Past Medical History  Diagnosis Date  . Hypertension   . Stroke [facility])     Past Surgical History  Procedure Laterality Date  . No past surgeries      There were no vitals filed for this visit.  Visit Diagnosis:  Hemiplegia affecting right dominant side ([place])  Lack of coordination due to stroke      Subjective Assessment - 01/31/15 1024    Subjective  "I've been trying to use it more"   Patient is accompained by: Family member  wife present for review/education at end of session   Pertinent History see Epic snapshot   Patient Stated Goals to get better   Currently in Pain? No/denies            [facility] OT Assessment - 01/31/15 0001    Coordination   Box and Blocks R-6 blocks   AROM   AROM Assessment Site Shoulder   Right/Left Shoulder Right   Right Shoulder Flexion 90 Degrees  min compensation                  OT Treatments/Exercises (OP) - 01/31/15 0001    ADLs   Overall ADLs Checked goals and discussed progress.   Eating Practiced simulated eating with built-up spoon with min A.  Pt issued built-up grips for utensils and toothbrush and instructed to  begin with 1-2 bites of sticky food and initiating brushing teeth.  Pt verbalized understanding.   ADL Comments Practiced grasp/release of cup in prep to drink, folding towels/wash cloths using RUE, practiced flipping cards, practiced opening drawer with RUE.  Pt needed mod cueing for normal movement patterns and intermittent min A from LUE.                OT Education - 01/31/15 1039    Education Details RUE functional use HEP   Person(s) Educated Patient;Spouse   Methods Explanation;Demonstration;Handout;Verbal cues   Comprehension Verbalized understanding;Returned demonstration;Verbal cues required          OT Short Term Goals - 01/31/15 1025    OT SHORT TERM GOAL #1   Title I with HEP   Baseline 01/07/15   Time 4   Period Weeks   Status Achieved   OT SHORT TERM GOAL #2   Title Pt will perform bathing with min A   Time 4   Period Weeks   Status Achieved   OT SHORT TERM GOAL #3   Title Pt will perform dressing with supervision/ setup   Time 4   Period Weeks   Status Achieved  except tying shoes (pt leaves tied. O.T. shown alternative options/AE)  OT SHORT TERM GOAL #4   Title Pt will use RUE as a stabilizer with minA   Time 4   Period Weeks   Status Achieved  01/31/15   OT SHORT TERM GOAL #5   Title Pt will demonstrate 50% A/ROM elbow flexion/ extension.   Time 4   Period Weeks   Status Achieved   Additional Short Term Goals   Additional Short Term Goals Yes   OT SHORT TERM GOAL #6   Title Pt will be modified independent with RUE positioning to minimize pain, risk for injury including splint wear PRN.   Time 4   Period Weeks   Status Achieved   OT SHORT TERM GOAL #7   Title Pt will use RUE as a stabilizer/ gross A for ADLs at least 50% of the time.--check updated STGs 03/02/15   Baseline new for renewal 01/31/15   Time 4   Period Weeks   Status New   OT SHORT TERM GOAL #8   Title Pt will demo at least 100* R shoulder flex with minimal compensation  for functional reaching.   Baseline new for renewal 01/31/15   Time 4   Period Weeks   Status New   OT SHORT TERM GOAL  #9   TITLE Pt will improve RUE coordination/functional reaching for ADLs as shown by improving score on box and blocks test by at least 5 with RUE.   Baseline new for renewal 01/31/15- 6 blocks    Time 4   Period Weeks   Status New           OT Long Term Goals - 01/31/15 1025    OT LONG TERM GOAL #1   Title Pt will use RUE as a stabilizer/ gross A for ADLs at least 25% of the time.   Baseline due 02/05/15--will update as STG for renewal period   Time 8   Period Weeks   Status Not Met  01/31/15  approx 10% per pt report   OT LONG TERM GOAL #2   Title Pt will demonstrate 30* shoulder flexion/ scaption in prep for functional reach.   Time 8   Period Weeks   Status Achieved  01/31/15 90* with minimal compensation   OT LONG TERM GOAL #3   Title Pt will perform all basic ADLs with distant supervision.   Time 8   Period Weeks   Status Achieved  01/31/15 met per pt except tying shoes (has been instructed in AE for this)   OT LONG TERM GOAL #4   Title Pt will perfom simple snack prep/ light home management with supervision.   Time 8   Period Weeks   Status Achieved  01/31/15 cold snack prep   OT LONG TERM GOAL #5   Title Pt will use RUE as a stabilizer/ gross A for ADLs at least 75% of the time.--check updated LTGs 04/01/15   Time 8   Period Weeks   Status New   Long Term Additional Goals   Additional Long Term Goals Yes   OT LONG TERM GOAL #6   Title Pt will demo at least 110* R shoulder flex with minimal compensation for functional reaching.   Time 8   Period Weeks   Status New   OT LONG TERM GOAL #7   Title Pt will improve RUE coordination/functional reaching for ADLs as shown by improving score on box and blocks test by at least 10 with RUE.   Baseline 6 blocks 01/31/15  Time 8   Period Weeks   Status New               Plan -  01/31/15 1109    Clinical Impression Statement Pt progressing well with improved RUE control and ROM.  Pt is still limited with RUE functional use and has difficulty coordinating normal movement patterns for functional use.  Pt would benefit from continued occupational therapy to improve R hemiparesis, ROM, strength, coordination for improved dominant RUE functional use and ADL/IADL performance.   Pt will benefit from skilled therapeutic intervention in order to improve on the following deficits (Retired) Abnormal gait;Decreased coordination;Decreased range of motion;Difficulty walking;Decreased endurance;Decreased activity tolerance;Decreased knowledge of precautions;Impaired tone;Pain;Impaired UE functional use;Decreased knowledge of use of DME;Decreased cognition;Decreased mobility;Decreased strength;Impaired perceived functional ability   Rehab Potential Good   OT Frequency 2x / week   OT Duration 8 weeks   OT Treatment/Interventions Self-care/ADL training;Moist Heat;Fluidtherapy;DME and/or AE instruction;Patient/family education;Balance training;Therapeutic exercises;Contrast Bath;Ultrasound;Therapeutic exercise;Therapeutic activities;Cognitive remediation/compensation;Passive range of motion;Functional Mobility Training;Neuromuscular education;Cryotherapy;Electrical Stimulation;Parrafin;Energy conservation;Manual Therapy;Visual/perceptual remediation/compensation   Plan renewal completed 01/31/15, neuro re-ed RUE/trunk, RUE functional use   OT Home Exercise Plan education issued:  12/12/14 Initial HEP and bed positioning handout, A/E recommendations; 01/31/15 updated RUE functional use HEP   Consulted and Agree with Plan of Care Patient;Family member/caregiver   Family Member Consulted wife [name]        Problem List Patient Active Problem List   Diagnosis Date Noted  . Essential hypertension 01/23/2015  . Hyperlipidemia 01/23/2015  . OSA (obstructive sleep apnea) 01/23/2015  . HLD  (hyperlipidemia)   . CVA (cerebral infarction) 11/23/2014  . Stroke ([place]) 11/23/2014  . Hypertension 11/23/2014    [facility] 01/31/2015, 1:03 PM  [facility] [address] [place], [place], [address] Phone: [phone]   Fax:  [place], OTR/L 01/31/2015 1:03 PM

## 2015-01-31 NOTE — Therapy (Signed)
[facility] [address] [place], [place], [address] Phone: [phone]   Fax:  [phone]  Speech Language Pathology Treatment  Patient Details  Name: Phillip Curry MRN: [id number] Date of Birth: May 03, 1955 Referring Provider:  Josetta Huddle, MD  Encounter Date: 01/31/2015      End of Session - 01/31/15 1156    Visit Number 17   Number of Visits 24   SLP Start Time 1007   SLP Stop Time  1146   SLP Time Calculation (min) 44 min   Activity Tolerance Patient tolerated treatment well      Past Medical History  Diagnosis Date  . Hypertension   . Stroke [facility])     Past Surgical History  Procedure Laterality Date  . No past surgeries      There were no vitals filed for this visit.  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 No/denies               ADULT SLP TREATMENT - 01/31/15 1108    General Information   Behavior/Cognition Pleasant mood;Cooperative;Requires cueing   Treatment Provided   Treatment provided Cognitive-Linquistic   Pain Assessment   Pain Assessment No/denies pain   Cognitive-Linquistic Treatment   Treatment focused on Cognition;Dysarthria   Skilled Treatment Pt expressed frustration concentrating on homework, he also states getting distracted and not completing chores or tasks. Pt. reports he is able to go back to a chore and complete it.  Facilitated organization of information and alternating attention  generting simple graph, entering data and simple math adding the data, Pt ID'd error, required extended time (slow processing) and min A to correct errors.  Mr. Mccullars alternated attention from worksheet with data to putting datat on graph with extended time and  rare min A          SLP Education - 01/31/15 1151    Education provided Yes   Education Details alternating attention   Person(s) Educated Patient   Methods Explanation;Demonstration   Comprehension Verbalized understanding;Returned demonstration          SLP Short Term Goals - 01/31/15 1155    SLP SHORT TERM GOAL #1   Title Pt will perform dysarthria HEP with rare minimal assistance   Time 1   Period Weeks   Status Achieved   SLP SHORT TERM GOAL #2   Title Pt will solve simple time, money, reasoning problems with 85% accuracy and occasional min A   Time 1   Period Weeks   Status Not Met   SLP SHORT TERM GOAL #3   Title Pt will manage meds at home with compensations with rare min A over 3 sessions as reported by spouse/pt.   Time 1   Period Weeks   Status Achieved          SLP Long Term Goals - 01/31/15 1155    SLP LONG TERM GOAL #1   Title Pt will be 90% intelligible in moderately complex conversation over 12 minutes   Time 1   Period Weeks   Status Revised   SLP LONG TERM GOAL #2   Title Pt will solve simple time, money reasoning problems with 80% accuracy and occasional min A   Time 1  Period Weeks   Status Revised   SLP LONG TERM GOAL #3   Title Pt will utilize compensations for schedule, daily chores, lists 2x during therapy session over 2 sessions with rare min A   Time 1   Period Weeks   Status Achieved   SLP LONG TERM GOAL #4   Title pt will demo emergent awareness in cognitive linguistic tasks with mod A occasionally   Time 2   Period Weeks   Status New          Plan - 01/31/15 1152    Clinical Impression Statement Mr. Hanna continues to improve with simple math and problem solving. Continue skilled ST to improve cognitive linguistic skills for more complex tasks. I instructed pt to reduce speech HEP to 1x a day, and continue oral exercises for facial droop 1-2x a day at home.    Speech Therapy Frequency 2x / week   Duration 1 week    Treatment/Interventions Oral motor exercises;Compensatory strategies;Patient/family education;Functional tasks;Cognitive reorganization;Internal/external aids;SLP instruction and feedback   Potential to Achieve Goals Good   Consulted and Agree with Plan of Care Patient;Family member/caregiver        Problem List Patient Active Problem List   Diagnosis Date Noted  . Essential hypertension 01/23/2015  . Hyperlipidemia 01/23/2015  . OSA (obstructive sleep apnea) 01/23/2015  . HLD (hyperlipidemia)   . CVA (cerebral infarction) 11/23/2014  . Stroke ([place]) 11/23/2014  . Hypertension 11/23/2014    Lovvorn, Annye Rusk MS, CCC-SLP 01/31/2015, 12:04 PM  [facility] [address] [place], [place], [address] Phone: [phone]   Fax:  [phone]

## 2015-02-02 ENCOUNTER — Encounter: Payer: Self-pay | Admitting: Neurology

## 2015-02-02 ENCOUNTER — Ambulatory Visit: Payer: BLUE CROSS/BLUE SHIELD | Admitting: Occupational Therapy

## 2015-02-02 ENCOUNTER — Ambulatory Visit: Payer: BLUE CROSS/BLUE SHIELD | Admitting: Physical Therapy

## 2015-02-02 ENCOUNTER — Ambulatory Visit: Payer: BLUE CROSS/BLUE SHIELD | Admitting: Speech Pathology

## 2015-02-02 DIAGNOSIS — R269 Unspecified abnormalities of gait and mobility: Secondary | ICD-10-CM | POA: Diagnosis not present

## 2015-02-02 DIAGNOSIS — G8191 Hemiplegia, unspecified affecting right dominant side: Secondary | ICD-10-CM

## 2015-02-02 DIAGNOSIS — IMO0002 Reserved for concepts with insufficient information to code with codable children: Secondary | ICD-10-CM

## 2015-02-02 DIAGNOSIS — R41841 Cognitive communication deficit: Secondary | ICD-10-CM

## 2015-02-02 NOTE — Therapy (Signed)
[facility] [address] [place], [place], [address] Phone: [phone]   Fax:  [phone]  Occupational Therapy Treatment  Patient Details  Name: Phillip Curry MRN: [id number] Date of Birth: Apr 26, 1955 Referring Provider:  Josetta Huddle, MD  Encounter Date: 02/02/2015      OT End of Session - 02/02/15 1025    Visit Number 18   Number of Visits 33   Date for OT Re-Evaluation 04/01/15   Authorization Type [employer]BS 120 visits combined, no auth   Authorization Time Period renewal completed 01/31/15, wk 1/8   Authorization - Number of Visits 40   OT Start Time 1022   OT Stop Time 1100   OT Time Calculation (min) 38 min   Activity Tolerance Patient tolerated treatment well   Behavior During Therapy [facility] for tasks assessed/performed      Past Medical History  Diagnosis Date  . Hypertension   . Stroke [facility])     Past Surgical History  Procedure Laterality Date  . No past surgeries      There were no vitals filed for this visit.  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 No/denies        Neuro Re-ed:  In supine, AAROM shoulder flex and chest press with PVC frame x15 each with only occasional min v.c.  Pt demo improved controlled movement and self-corrects more.  In sitting: AAROM shoulder flex x15 each with only occasional min v.c.  Wt. Bearing through hand with body on arm movements, lateral wt. Shift, and trunk rotation with min-mod cues. Wt. Bearing through elbow/hand to reach to floor and then push through R hand to sitting in diagonal pattern for trunk rotation/elongation with min cueing.  AAROM shoulder abduction with ball on mat with  min facilitation/cues and focus on elbow ext, wrist/finger ext in prep for functional reach.   Low-mid range functional reaching with both UEs to grasp/release medium ball from various planes with/without trunk rotation. Low-range functional reaching for cylinder objects with min-mod cueing for normal movement patterns.   In standing: AAROM shoulder flex with UE ranger along diagonal for higher ranges with min v.c.  Functional reaching to open/close drawer/cabinet door with handle in low range.  Pt with only occasional min v.c.   All with focus on normal movement patterns and proper positioning.                         OT Short Term Goals - 01/31/15 1025    OT SHORT TERM GOAL #1   Title I with HEP   Baseline 01/07/15   Time 4   Period Weeks   Status Achieved   OT SHORT TERM GOAL #2   Title Pt will perform bathing with min A   Time 4   Period Weeks   Status Achieved   OT SHORT TERM GOAL #3   Title Pt will perform dressing with supervision/ setup   Time 4   Period Weeks   Status Achieved  except tying shoes (pt leaves tied. O.T. shown alternative options/AE)   OT SHORT TERM GOAL #4   Title Pt will use RUE as a  stabilizer with minA   Time 4   Period Weeks   Status Achieved  01/31/15   OT SHORT TERM GOAL #5   Title Pt will demonstrate 50% A/ROM elbow flexion/ extension.   Time 4   Period Weeks   Status Achieved   Additional Short Term Goals   Additional Short Term Goals Yes   OT SHORT TERM GOAL #6   Title Pt will be modified independent with RUE positioning to minimize pain, risk for injury including splint wear PRN.   Time 4   Period Weeks   Status Achieved   OT SHORT TERM GOAL #7   Title Pt will use RUE as a stabilizer/ gross A for ADLs at least 50% of the time.--check updated STGs 03/02/15   Baseline new for renewal 01/31/15   Time 4   Period Weeks   Status New   OT SHORT TERM GOAL #8   Title Pt will demo at least 100* R shoulder flex with  minimal compensation for functional reaching.   Baseline new for renewal 01/31/15   Time 4   Period Weeks   Status New   OT SHORT TERM GOAL  #9   TITLE Pt will improve RUE coordination/functional reaching for ADLs as shown by improving score on box and blocks test by at least 5 with RUE.   Baseline new for renewal 01/31/15- 6 blocks    Time 4   Period Weeks   Status New           OT Long Term Goals - 01/31/15 1025    OT LONG TERM GOAL #1   Title Pt will use RUE as a stabilizer/ gross A for ADLs at least 25% of the time.   Baseline due 02/05/15--will update as STG for renewal period   Time 8   Period Weeks   Status Not Met  01/31/15  approx 10% per pt report   OT LONG TERM GOAL #2   Title Pt will demonstrate 30* shoulder flexion/ scaption in prep for functional reach.   Time 8   Period Weeks   Status Achieved  01/31/15 90* with minimal compensation   OT LONG TERM GOAL #3   Title Pt will perform all basic ADLs with distant supervision.   Time 8   Period Weeks   Status Achieved  01/31/15 met per pt except tying shoes (has been instructed in AE for this)   OT LONG TERM GOAL #4   Title Pt will perfom simple snack prep/ light home management with supervision.   Time 8   Period Weeks   Status Achieved  01/31/15 cold snack prep   OT LONG TERM GOAL #5   Title Pt will use RUE as a stabilizer/ gross A for ADLs at least 75% of the time.--check updated LTGs 04/01/15   Time 8   Period Weeks   Status New   Long Term Additional Goals   Additional Long Term Goals Yes   OT LONG TERM GOAL #6   Title Pt will demo at least 110* R shoulder flex with minimal compensation for functional reaching.   Time 8   Period Weeks   Status New   OT LONG TERM GOAL #7   Title Pt will improve RUE coordination/functional reaching for ADLs as shown by improving score on box and blocks test by at least 10 with RUE.   Baseline 6 blocks 01/31/15   Time 8   Period Weeks   Status New  Plan - 02/02/15 1027    Clinical Impression Statement Pt continues to progress with RUE functional use and reach with less compensations.   Plan neuro re-ed to RUE/trunk, RUE functional use        Problem List Patient Active Problem List   Diagnosis Date Noted  . Essential hypertension 01/23/2015  . Hyperlipidemia 01/23/2015  . OSA (obstructive sleep apnea) 01/23/2015  . HLD (hyperlipidemia)   . CVA (cerebral infarction) 11/23/2014  . Stroke ([place]) 11/23/2014  . Hypertension 11/23/2014    [facility] 02/02/2015, 12:39 PM  [facility] [address] [place], [place], [address] Phone: [phone]   Fax:  [place], OTR/L 02/02/2015 12:39 PM

## 2015-02-02 NOTE — Patient Instructions (Signed)
Homework provided 

## 2015-02-02 NOTE — Therapy (Signed)
[facility] [address] [place], [place], [address] Phone: [phone]   Fax:  [phone]  Speech Language Pathology Treatment  Patient Details  Name: Phillip Curry MRN: [id number] Date of Birth: [birth year] Referring Provider:  Josetta Huddle, MD  Encounter Date: 02/02/2015      End of Session - 02/02/15 1208    Visit Number 19   Number of Visits 35   Date for SLP Re-Evaluation 03/30/15   SLP Start Time 1104   SLP Stop Time  1150   SLP Time Calculation (min) 46 min   Activity Tolerance Patient tolerated treatment well      Past Medical History  Diagnosis Date  . Hypertension   . Stroke [facility])     Past Surgical History  Procedure Laterality Date  . No past surgeries      There were no vitals filed for this visit.  Visit Diagnosis: Cognitive communication deficit             ADULT SLP TREATMENT - 02/02/15 1156    General Information   Behavior/Cognition Pleasant mood;Cooperative;Requires cueing   Treatment Provided   Treatment provided Cognitive-Linquistic   Pain Assessment   Pain Assessment No/denies pain   Cognitive-Linquistic Treatment   Treatment focused on Cognition;Dysarthria   Skilled Treatment Checked long term goals today. Pt has improved with simple time/money problem soliving, but continues to required much extended time and written cues for basic problems that should be done in his head. Pt has met dysarthria goals. Slight slur/labial weakness persist, however pt is intelligiblie in lengthy conversation. I administered the [place] Cogntive Assessment today (MoCA) with a score of 18/30 (26/30 is WNL). Simple reasoning, abstract reasoning and organization of thoughts and information (executive function)  tasks with noted difficulty/deficits. Attention tasks also with noted deficits. Due to significant cognitive impairments, I added cognitive goals. Dysarthria goals achieved. I recommend  pt continue skilled ST to maximize cogntion for  improved independence and to reduce caregiver burden   Assessment / Recommendations / Plan   Plan Goals updated          SLP Education - 02/02/15 1205    Education provided Yes   Education Details results of MoCA, new updated goals   Person(s) Educated Patient;Spouse   Methods Explanation;Demonstration   Comprehension Verbalized understanding          SLP Short Term Goals - 02/02/15 1150    SLP SHORT TERM GOAL #1   Title Pt will perform dysarthria HEP with rare minimal assistance   Time 1   Period Weeks   Status Achieved   SLP SHORT TERM GOAL #2   Title Pt will solve simple time, money, reasoning problems with 85% accuracy and occasional min A   Time 1   Period Weeks   Status Not Met   SLP SHORT TERM GOAL #3   Title Pt will manage meds at home with compensations with rare min A over 3 sessions as reported by spouse/pt.   Time 1   Period Weeks   Status Achieved          SLP Long Term Goals - 02/02/15 1150    SLP LONG TERM GOAL #1   Title Pt will be 90% intelligible in moderately complex conversation over 12 minutes   Time 1   Period Weeks   Status Achieved   SLP LONG TERM GOAL #2   Title Pt will solve simple time, money reasoning problems with 80% accuracy and occasional  min A   Time 8   Period Weeks   Status Revised   SLP LONG TERM GOAL #3   Title Pt will utilize compensations for schedule, daily chores, lists 2x during therapy session over 2 sessions with rare min A   Time 1   Period Weeks   Status Achieved   SLP LONG TERM GOAL #4   Title pt will demo emergent awareness in cognitive linguistic tasks with mod A occasionally   Time 8   Period Weeks   Status New   SLP LONG TERM GOAL #5   Title Pt will solve simple reasoning and organization cogntive linguistic problems with 80% accuracy and occasinal min A   Time 8   Period Weeks   Status New          Plan - 02/02/15 1205    Clinical Impression  Statement See Skilled Treatment. Re-assessed cogntive -linguistic deficits today. Continue goals for simple time/money problem solving, emergent awareness. Added goal for organization  (executive function) and reasoning problem solving. Goals met for dysarthria, pt to continue dysarthira HEP at home.    Speech Therapy Frequency 2x / week   Duration --  8 more weeks   Treatment/Interventions Compensatory strategies;Patient/family education;Functional tasks;Cognitive reorganization;Internal/external aids;SLP instruction and feedback   Potential to Achieve Goals Good   Potential Considerations Ability to learn/carryover information;Severity of impairments   Consulted and Agree with Plan of Care Patient;Family member/caregiver   Family Member Consulted spouse        Problem List Patient Active Problem List   Diagnosis Date Noted  . Essential hypertension 01/23/2015  . Hyperlipidemia 01/23/2015  . OSA (obstructive sleep apnea) 01/23/2015  . HLD (hyperlipidemia)   . CVA (cerebral infarction) 11/23/2014  . Stroke ([place]) 11/23/2014  . Hypertension 11/23/2014    Lovvorn, Annye Rusk MS, CCC-SLP 02/02/2015, 12:11 PM  [facility] [address] [place], [place], [address] Phone: [phone]   Fax:  [phone]

## 2015-02-06 ENCOUNTER — Ambulatory Visit: Payer: BLUE CROSS/BLUE SHIELD | Admitting: Occupational Therapy

## 2015-02-06 DIAGNOSIS — R269 Unspecified abnormalities of gait and mobility: Secondary | ICD-10-CM | POA: Diagnosis not present

## 2015-02-06 DIAGNOSIS — G8191 Hemiplegia, unspecified affecting right dominant side: Secondary | ICD-10-CM

## 2015-02-06 DIAGNOSIS — IMO0002 Reserved for concepts with insufficient information to code with codable children: Secondary | ICD-10-CM

## 2015-02-06 NOTE — Therapy (Signed)
[facility] [address] [employer] [place], [place], [address] Phone: [phone]   Fax:  [phone]  Occupational Therapy Treatment  Patient Details  Name: Phillip Curry MRN: [id number] Date of Birth: 1955/08/10 No Data Recorded  Encounter Date: 02/06/2015      OT End of Session - 02/06/15 1106    Visit Number 19   Number of Visits 33   Date for OT Re-Evaluation 04/01/15   Authorization Type [employer] 120 visits combined, no auth   Authorization Time Period renewal completed 01/31/15, wk 1/8   Authorization - Number of Visits 40   OT Start Time 1102   OT Stop Time 1145   OT Time Calculation (min) 43 min   Activity Tolerance Patient tolerated treatment well   Behavior During Therapy [facility] for tasks assessed/performed      Past Medical History  Diagnosis Date  . Hypertension   . Stroke [facility])     Past Surgical History  Procedure Laterality Date  . No past surgeries      There were no vitals filed for this visit.  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 No/denies  mild discomfort intermittently in R wrist/shoulder which improves with stretching        Neuro Re-ed:  In supine, AAROM shoulder flex and chest press with PVC frame x20 each with only occasional min v.c. Pt demo improved controlled movement and self-corrects more.  In sitting: AAROM shoulder flex x10 each with only occasional min v.c. AAROM shoulder abduction with cane with mod facilitation/cues and focus on elbow ext.  In quadraped:  Wt.  Bearing in cat/cow positions for incr tricep activation, incr scapular/trunk mobility and stability, and decr tone with min-mod cueing.  Tall kneeling to perform shoulder flex as able with ball on mat with min cues.    In standing: AAROM shoulder flex with UE ranger along diagonal for higher ranges with min v.c.   All with focus on normal movement patterns and proper positioning.   Manual:  Gentle joint mobs and wrist extension stretch due to intermittent discomfort in prep for wt. Bearing.  Facilitated shoulder abduction stretch due to discomfort reported intermittently in lateral upper arm.    Therapeutic Exercise:  Arm bike x54mn with R hand wrapped for reciprocal movements with min cueing for normal movement patterns.                   OT Education - 02/06/15 1239    Education Details normal movement patterns with RUE functional reach, particularly in abduction.  elevate/massage R hand due to edema, importance of stretching   Person(s) Educated Patient;Spouse   Methods Explanation;Demonstration;Verbal cues   Comprehension Verbalized understanding;Returned demonstration          OT Short Term Goals - 01/31/15 1025    OT SHORT TERM GOAL #1   Title I with HEP   Baseline 01/07/15   Time 4   Period Weeks   Status Achieved   OT SHORT  TERM GOAL #2   Title Pt will perform bathing with min A   Time 4   Period Weeks   Status Achieved   OT SHORT TERM GOAL #3   Title Pt will perform dressing with supervision/ setup   Time 4   Period Weeks   Status Achieved  except tying shoes (pt leaves tied. O.T. shown alternative options/AE)   OT SHORT TERM GOAL #4   Title Pt will use RUE as a stabilizer with minA   Time 4   Period Weeks   Status Achieved  01/31/15   OT SHORT TERM GOAL #5   Title Pt will demonstrate 50% A/ROM elbow flexion/ extension.   Time 4   Period Weeks   Status Achieved   Additional Short Term Goals   Additional Short Term Goals Yes   OT SHORT TERM  GOAL #6   Title Pt will be modified independent with RUE positioning to minimize pain, risk for injury including splint wear PRN.   Time 4   Period Weeks   Status Achieved   OT SHORT TERM GOAL #7   Title Pt will use RUE as a stabilizer/ gross A for ADLs at least 50% of the time.--check updated STGs 03/02/15   Baseline new for renewal 01/31/15   Time 4   Period Weeks   Status New   OT SHORT TERM GOAL #8   Title Pt will demo at least 100* R shoulder flex with minimal compensation for functional reaching.   Baseline new for renewal 01/31/15   Time 4   Period Weeks   Status New   OT SHORT TERM GOAL  #9   TITLE Pt will improve RUE coordination/functional reaching for ADLs as shown by improving score on box and blocks test by at least 5 with RUE.   Baseline new for renewal 01/31/15- 6 blocks    Time 4   Period Weeks   Status New           OT Long Term Goals - 01/31/15 1025    OT LONG TERM GOAL #1   Title Pt will use RUE as a stabilizer/ gross A for ADLs at least 25% of the time.   Baseline due 02/05/15--will update as STG for renewal period   Time 8   Period Weeks   Status Not Met  01/31/15  approx 10% per pt report   OT LONG TERM GOAL #2   Title Pt will demonstrate 30* shoulder flexion/ scaption in prep for functional reach.   Time 8   Period Weeks   Status Achieved  01/31/15 90* with minimal compensation   OT LONG TERM GOAL #3   Title Pt will perform all basic ADLs with distant supervision.   Time 8   Period Weeks   Status Achieved  01/31/15 met per pt except tying shoes (has been instructed in AE for this)   OT LONG TERM GOAL #4   Title Pt will perfom simple snack prep/ light home management with supervision.   Time 8   Period Weeks   Status Achieved  01/31/15 cold snack prep   OT LONG TERM GOAL #5   Title Pt will use RUE as a stabilizer/ gross A for ADLs at least 75% of the time.--check updated LTGs 04/01/15   Time 8   Period Weeks   Status New   Long Term  Additional Goals   Additional Long Term Goals Yes   OT LONG TERM GOAL #6   Title Pt  will demo at least 110* R shoulder flex with minimal compensation for functional reaching.   Time 8   Period Weeks   Status New   OT LONG TERM GOAL #7   Title Pt will improve RUE coordination/functional reaching for ADLs as shown by improving score on box and blocks test by at least 10 with RUE.   Baseline 6 blocks 01/31/15   Time 8   Period Weeks   Status New               Plan - 02/06/15 1237    Clinical Impression Statement Pt demo incr tone today with intermittent wrist/deltoid area discomfort which improved with stretching.  Emphasized stretching and normal movement patterns.   Plan neuro re-ed, RUE functional use   Consulted and Agree with Plan of Care Patient;Family member/caregiver   Family Member Consulted wife [name]        Problem List Patient Active Problem List   Diagnosis Date Noted  . Essential hypertension 01/23/2015  . Hyperlipidemia 01/23/2015  . OSA (obstructive sleep apnea) 01/23/2015  . HLD (hyperlipidemia)   . CVA (cerebral infarction) 11/23/2014  . Stroke ([place]) 11/23/2014  . Hypertension 11/23/2014    [facility] 02/06/2015, 12:43 PM  [facility] [address] [place], [place], [address] Phone: [phone]   Fax:  [phone]  Name: Dixon Luczak MRN: [id number] Date of Birth: [birth year]  Vianne Bulls, OTR/L 02/06/2015 12:43 PM

## 2015-02-07 ENCOUNTER — Ambulatory Visit: Payer: BLUE CROSS/BLUE SHIELD | Admitting: Occupational Therapy

## 2015-02-07 DIAGNOSIS — IMO0002 Reserved for concepts with insufficient information to code with codable children: Secondary | ICD-10-CM

## 2015-02-07 DIAGNOSIS — R269 Unspecified abnormalities of gait and mobility: Secondary | ICD-10-CM | POA: Diagnosis not present

## 2015-02-07 DIAGNOSIS — G8191 Hemiplegia, unspecified affecting right dominant side: Secondary | ICD-10-CM

## 2015-02-07 NOTE — Therapy (Signed)
[facility] [address] [place], [place], [address] Phone: [phone]   Fax:  [phone]  Occupational Therapy Treatment  Patient Details  Name: Phillip Curry MRN: [id number] Date of Birth: 1955-07-27 No Data Recorded  Encounter Date: 02/07/2015      OT End of Session - 02/07/15 1108    Visit Number 20   Number of Visits 33   Date for OT Re-Evaluation 04/01/15   Authorization Type [employer] 120 visits combined, no auth   Authorization Time Period renewal completed 01/31/15, wk 1/8   Authorization - Number of Visits 40   OT Start Time 1103   OT Stop Time 1145   OT Time Calculation (min) 42 min   Activity Tolerance Patient tolerated treatment well   Behavior During Therapy [facility] for tasks assessed/performed      Past Medical History  Diagnosis Date  . Hypertension   . Stroke [facility])     Past Surgical History  Procedure Laterality Date  . No past surgeries      There were no vitals filed for this visit.  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 Yes   Pain Score 3    Pain Location Shoulder   Pain Orientation Right   Pain Descriptors / Indicators Tightness;Sore   Pain Type Acute pain   Pain Frequency Intermittent   Aggravating Factors  before stretching   Pain Relieving Factors stretchings, repositioning           Neuro Re-ed: In supine, AAROM shoulder flex and chest press with ball x20 each with only occasional min v.c. Shoulder abduction with hemi-glide with min facilitation/mod cues.  In sitting: AAROM shoulder abduction with hemiglide with min facilitation/cues and focus on elbow ext.  Then AAROM  shoulder abduction on ball with lateral wt. shift, trunk extension with therapist facilitating wrist/finger ext and supination in prep for functional reach.  Functional reaching with RUE to grasp/release cylinder objects with intermittent min facilitation and cues for normal movement patterns.  Flipping large cards to place on mat with focus on supination, finger extension, and ER without trunk compensation with min cueing.  Low-range functional reaching to remove larger cylinder pegs and place in container with ER with min facilitation and mod cueing for normal movement patterns.  Pt demo significantly decr trunk compensation with repetition and cueing.    In modified quadraped: with wt. Bearing through hands on mat, forward/backward wt. With min cues/facilitation followed by wt.bearing through RUE while sliding LUE forward lightly with mod facilitation/cues.  All with focus on normal movement patterns and proper positioning.                     OT Education - 02/07/15 1223    Education Details AROM R wrist extension and supination   Person(s) Educated Patient;Spouse   Methods Explanation;Verbal cues   Comprehension Verbalized understanding          OT Short Term Goals - 01/31/15 1025    OT SHORT TERM GOAL #1   Title I with HEP   Baseline 01/07/15   Time 4   Period Weeks   Status Achieved  OT SHORT TERM GOAL #2   Title Pt will perform bathing with min A   Time 4   Period Weeks   Status Achieved   OT SHORT TERM GOAL #3   Title Pt will perform dressing with supervision/ setup   Time 4   Period Weeks   Status Achieved  except tying shoes (pt leaves tied. O.T. shown alternative options/AE)   OT SHORT TERM GOAL #4   Title Pt will use RUE as a stabilizer with minA   Time 4   Period Weeks   Status Achieved  01/31/15   OT SHORT TERM GOAL #5   Title Pt will demonstrate 50% A/ROM elbow flexion/ extension.   Time 4   Period Weeks   Status Achieved   Additional  Short Term Goals   Additional Short Term Goals Yes   OT SHORT TERM GOAL #6   Title Pt will be modified independent with RUE positioning to minimize pain, risk for injury including splint wear PRN.   Time 4   Period Weeks   Status Achieved   OT SHORT TERM GOAL #7   Title Pt will use RUE as a stabilizer/ gross A for ADLs at least 50% of the time.--check updated STGs 03/02/15   Baseline new for renewal 01/31/15   Time 4   Period Weeks   Status New   OT SHORT TERM GOAL #8   Title Pt will demo at least 100* R shoulder flex with minimal compensation for functional reaching.   Baseline new for renewal 01/31/15   Time 4   Period Weeks   Status New   OT SHORT TERM GOAL  #9   TITLE Pt will improve RUE coordination/functional reaching for ADLs as shown by improving score on box and blocks test by at least 5 with RUE.   Baseline new for renewal 01/31/15- 6 blocks    Time 4   Period Weeks   Status New           OT Long Term Goals - 01/31/15 1025    OT LONG TERM GOAL #1   Title Pt will use RUE as a stabilizer/ gross A for ADLs at least 25% of the time.   Baseline due 02/05/15--will update as STG for renewal period   Time 8   Period Weeks   Status Not Met  01/31/15  approx 10% per pt report   OT LONG TERM GOAL #2   Title Pt will demonstrate 30* shoulder flexion/ scaption in prep for functional reach.   Time 8   Period Weeks   Status Achieved  01/31/15 90* with minimal compensation   OT LONG TERM GOAL #3   Title Pt will perform all basic ADLs with distant supervision.   Time 8   Period Weeks   Status Achieved  01/31/15 met per pt except tying shoes (has been instructed in AE for this)   OT LONG TERM GOAL #4   Title Pt will perfom simple snack prep/ light home management with supervision.   Time 8   Period Weeks   Status Achieved  01/31/15 cold snack prep   OT LONG TERM GOAL #5   Title Pt will use RUE as a stabilizer/ gross A for ADLs at least 75% of the time.--check updated  LTGs 04/01/15   Time 8   Period Weeks   Status New   Long Term Additional Goals   Additional Long Term Goals Yes   OT LONG TERM GOAL #6  Title Pt will demo at least 110* R shoulder flex with minimal compensation for functional reaching.   Time 8   Period Weeks   Status New   OT LONG TERM GOAL #7   Title Pt will improve RUE coordination/functional reaching for ADLs as shown by improving score on box and blocks test by at least 10 with RUE.   Baseline 6 blocks 01/31/15   Time 8   Period Weeks   Status New               Plan - 02/07/15 1225    Clinical Impression Statement Pt continues to make good progress and demo decr trunk compensation with RUE functional use today.  Spasticity impacts movement at times, but improved today and no pain by end of session.   Plan ?full composite extension splint, neuro re-ed, RUE functional use   Consulted and Agree with Plan of Care Patient;Family member/caregiver   Family Member Consulted wife [name]        Problem List Patient Active Problem List   Diagnosis Date Noted  . Essential hypertension 01/23/2015  . Hyperlipidemia 01/23/2015  . OSA (obstructive sleep apnea) 01/23/2015  . HLD (hyperlipidemia)   . CVA (cerebral infarction) 11/23/2014  . Stroke ([place]) 11/23/2014  . Hypertension 11/23/2014    [facility] 02/07/2015, 12:41 PM  [facility] [address] [place], [place], [address] Phone: [phone]   Fax:  [phone]  Name: Duard Spiewak MRN: [id number] Date of Birth: 28-Jun-1955  Vianne Bulls, OTR/L 02/07/2015 12:41 PM

## 2015-02-09 ENCOUNTER — Ambulatory Visit: Payer: BLUE CROSS/BLUE SHIELD | Admitting: Speech Pathology

## 2015-02-09 DIAGNOSIS — R269 Unspecified abnormalities of gait and mobility: Secondary | ICD-10-CM | POA: Diagnosis not present

## 2015-02-09 DIAGNOSIS — R41841 Cognitive communication deficit: Secondary | ICD-10-CM

## 2015-02-09 NOTE — Therapy (Signed)
[facility] [address] [place], [place], [address] Phone: [phone]   Fax:  [phone]  Speech Language Pathology Treatment  Patient Details  Name: Phillip Curry MRN: [id number] Date of Birth: [birth year] No Data Recorded  Encounter Date: 02/09/2015      End of Session - 02/09/15 1112    Visit Number 20   Number of Visits 35   Date for SLP Re-Evaluation 03/30/15   SLP Start Time 1017   SLP Stop Time  1100   SLP Time Calculation (min) 43 min   Activity Tolerance Patient tolerated treatment well      Past Medical History  Diagnosis Date  . Hypertension   . Stroke [facility])     Past Surgical History  Procedure Laterality Date  . No past surgeries      There were no vitals filed for this visit.  Visit Diagnosis: Cognitive communication deficit      Subjective Assessment - 02/09/15 1021    Subjective "I made the charts for homework"   Patient is accompained by: Family member               ADULT SLP TREATMENT - 02/09/15 1030    General Information   Behavior/Cognition Pleasant mood;Cooperative;Requires cueing   Treatment Provided   Treatment provided Cognitive-Linquistic   Pain Assessment   Pain Assessment No/denies pain   Cognitive-Linquistic Treatment   Treatment focused on Cognition   Skilled Treatment Facilitated organization of mildly complex information with check regsiter task - pt required extended time and usual  mod cues for organization attention to details. Mildly complex math required usual mod A to set up and solve problem.    Assessment / Recommendations / Plan   Plan Goals updated   Progression Toward Goals   Progression toward goals Progressing toward goals            SLP Short Term Goals - 02/09/15 1111    SLP SHORT TERM GOAL #1   Title Pt will perform dysarthria HEP with rare minimal assistance   Time 1   Period Weeks   Status Achieved   SLP SHORT TERM GOAL #2    Title Pt will solve simple time, money, reasoning problems with 85% accuracy and occasional min A   Time 1   Period Weeks   Status Not Met   SLP SHORT TERM GOAL #3   Title Pt will manage meds at home with compensations with rare min A over 3 sessions as reported by spouse/pt.   Time 1   Period Weeks   Status Achieved          SLP Long Term Goals - 02/09/15 1111    SLP LONG TERM GOAL #1   Title Pt will be 90% intelligible in moderately complex conversation over 12 minutes   Time 1   Period Weeks   Status Achieved   SLP LONG TERM GOAL #2   Title Pt will solve simple time, money reasoning problems with 80% accuracy and occasional min A   Time 7   Period Weeks   Status On-going   SLP LONG TERM GOAL #3   Title Pt will utilize compensations for schedule, daily chores, lists 2x during therapy session over 2 sessions with rare min A   Time 1   Period Weeks   Status Achieved   SLP LONG TERM GOAL #4   Title pt will demo emergent awareness in cognitive linguistic tasks with mod A occasionally  Time 7   Period Weeks   Status On-going   SLP LONG TERM GOAL #5   Title Pt will solve simple reasoning and organization cogntive linguistic problems with 80% accuracy and occasinal min A   Time 7   Period Weeks   Status On-going          Plan - 02/09/15 1108    Clinical Impression Statement Pt requried Mod A for attention to details and organization. He also reqiuired mod A for alternating attention and math problems. Continue skilled ST to maximize cognition for independence and eventual return to work.   Speech Therapy Frequency 2x / week   Treatment/Interventions Compensatory strategies;Patient/family education;Functional tasks;Cognitive reorganization;Internal/external aids;SLP instruction and feedback   Potential to Achieve Goals Good   Potential Considerations Ability to learn/carryover information;Severity of impairments   Consulted and Agree with Plan of Care Patient;Family  member/caregiver   Family Member Consulted spouse        Problem List Patient Active Problem List   Diagnosis Date Noted  . Essential hypertension 01/23/2015  . Hyperlipidemia 01/23/2015  . OSA (obstructive sleep apnea) 01/23/2015  . HLD (hyperlipidemia)   . CVA (cerebral infarction) 11/23/2014  . Stroke ([place]) 11/23/2014  . Hypertension 11/23/2014    Lovvorn, Annye Rusk MS, CCC-SLP 02/09/2015, 11:14 AM  [facility] [address] [place], [place], [address] Phone: [phone]   Fax:  [phone]   Name: Phillip Curry MRN: [id number] Date of Birth: [birth year]

## 2015-02-09 NOTE — Patient Instructions (Signed)
Homework provided 

## 2015-02-14 ENCOUNTER — Encounter: Payer: Self-pay | Admitting: Occupational Therapy

## 2015-02-14 ENCOUNTER — Ambulatory Visit: Payer: BLUE CROSS/BLUE SHIELD | Admitting: Occupational Therapy

## 2015-02-14 DIAGNOSIS — IMO0002 Reserved for concepts with insufficient information to code with codable children: Secondary | ICD-10-CM

## 2015-02-14 DIAGNOSIS — M25511 Pain in right shoulder: Secondary | ICD-10-CM

## 2015-02-14 DIAGNOSIS — G8191 Hemiplegia, unspecified affecting right dominant side: Secondary | ICD-10-CM

## 2015-02-14 DIAGNOSIS — R269 Unspecified abnormalities of gait and mobility: Secondary | ICD-10-CM | POA: Diagnosis not present

## 2015-02-14 NOTE — Therapy (Signed)
[facility] [address] [place], [place], [address] Phone: [phone]   Fax:  [phone]  Occupational Therapy Treatment  Patient Details  Name: Phillip Curry MRN: [id number] Date of Birth: [birth year] No Data Recorded  Encounter Date: 02/14/2015      OT End of Session - 02/14/15 1308    Visit Number 21   Number of Visits 33   Date for OT Re-Evaluation 04/01/15   Authorization Type [employer] 120 visits combined, no auth   Authorization Time Period renewal completed 01/31/15, wk 2/8   OT Start Time 1100   OT Stop Time 1145   OT Time Calculation (min) 45 min   Activity Tolerance Patient tolerated treatment well      Past Medical History  Diagnosis Date  . Hypertension   . Stroke [facility])     Past Surgical History  Procedure Laterality Date  . No past surgeries      There were no vitals filed for this visit.  Visit Diagnosis:  Hemiplegia affecting right dominant side ([place])  Lack of coordination due to stroke  Pain in joint of right shoulder      Subjective Assessment - 02/14/15 1104    Subjective  My shoulder has been hurting some   Pertinent History see Epic snapshot   Patient Stated Goals to get better   Currently in Pain? Yes   Pain Score 2    Pain Location Shoulder   Pain Orientation Right   Pain Descriptors / Indicators Tightness;Sore   Pain Type Acute pain   Pain Onset 1 to 4 weeks ago   Pain Frequency Intermittent   Aggravating Factors  before stretching   Pain Relieving Factors stretching, proper positioning         TREATMENT:   Pt/wife shown trunk extension stretch in supine with towel roll to help stretch pects and teres major. Emphasized importance of trunk extension and shoulder extension/ER stretch in sitting to tolerance to maintain muscle balance of sh. girdle and help manage/reduce pain. Pt return demo with min cues and wife present to carry over at home. Also, performed AA/ROM  in shoulder abduction with min cues to correct positioning. Pt had minimal pain, but with adjustments, pt had no pain. Pt/wife shown how to perform AA/ROM in abduction and horizontal abduction at home sitting to side of table.  Progressed to low level reaching to flip over large cards for finger extension and supination. Pt then retrieved/replaced cones with min assist to stabalize cones with emphasis on finger extension.  Kinesiotape applied to relax shoulder IR muscles RUE for pain management.                        OT Short Term Goals - 01/31/15 1025    OT SHORT TERM GOAL #1   Title I with HEP   Baseline 01/07/15   Time 4   Period Weeks   Status Achieved   OT SHORT TERM GOAL #2   Title Pt will perform bathing with min A   Time 4   Period Weeks   Status Achieved   OT SHORT TERM GOAL #3   Title Pt will perform dressing with supervision/ setup   Time 4   Period Weeks   Status Achieved  except tying shoes (pt leaves tied. O.T. shown alternative options/AE)   OT SHORT TERM GOAL #4   Title Pt will use RUE as a stabilizer with minA   Time 4  Period Weeks   Status Achieved  01/31/15   OT SHORT TERM GOAL #5   Title Pt will demonstrate 50% A/ROM elbow flexion/ extension.   Time 4   Period Weeks   Status Achieved   Additional Short Term Goals   Additional Short Term Goals Yes   OT SHORT TERM GOAL #6   Title Pt will be modified independent with RUE positioning to minimize pain, risk for injury including splint wear PRN.   Time 4   Period Weeks   Status Achieved   OT SHORT TERM GOAL #7   Title Pt will use RUE as a stabilizer/ gross A for ADLs at least 50% of the time.--check updated STGs 03/02/15   Baseline new for renewal 01/31/15   Time 4   Period Weeks   Status New   OT SHORT TERM GOAL #8   Title Pt will demo at least 100* R shoulder flex with minimal compensation for functional reaching.   Baseline new for renewal 01/31/15   Time 4   Period Weeks    Status New   OT SHORT TERM GOAL  #9   TITLE Pt will improve RUE coordination/functional reaching for ADLs as shown by improving score on box and blocks test by at least 5 with RUE.   Baseline new for renewal 01/31/15- 6 blocks    Time 4   Period Weeks   Status New           OT Long Term Goals - 01/31/15 1025    OT LONG TERM GOAL #1   Title Pt will use RUE as a stabilizer/ gross A for ADLs at least 25% of the time.   Baseline due 02/05/15--will update as STG for renewal period   Time 8   Period Weeks   Status Not Met  01/31/15  approx 10% per pt report   OT LONG TERM GOAL #2   Title Pt will demonstrate 30* shoulder flexion/ scaption in prep for functional reach.   Time 8   Period Weeks   Status Achieved  01/31/15 90* with minimal compensation   OT LONG TERM GOAL #3   Title Pt will perform all basic ADLs with distant supervision.   Time 8   Period Weeks   Status Achieved  01/31/15 met per pt except tying shoes (has been instructed in AE for this)   OT LONG TERM GOAL #4   Title Pt will perfom simple snack prep/ light home management with supervision.   Time 8   Period Weeks   Status Achieved  01/31/15 cold snack prep   OT LONG TERM GOAL #5   Title Pt will use RUE as a stabilizer/ gross A for ADLs at least 75% of the time.--check updated LTGs 04/01/15   Time 8   Period Weeks   Status New   Long Term Additional Goals   Additional Long Term Goals Yes   OT LONG TERM GOAL #6   Title Pt will demo at least 110* R shoulder flex with minimal compensation for functional reaching.   Time 8   Period Weeks   Status New   OT LONG TERM GOAL #7   Title Pt will improve RUE coordination/functional reaching for ADLs as shown by improving score on box and blocks test by at least 10 with RUE.   Baseline 6 blocks 01/31/15   Time 8   Period Weeks   Status New  Plan - 02/14/15 1308    Clinical Impression Statement Pt limited by shoulder pain today and good  portion of today's session was focused on stretching, proper positioning of RUE during specific ex's and taping.    Plan continue to monitor and address Rt shoulder pain, continue NMR and functional use of RUE. (following session: consider full composite extension splint)   OT Home Exercise Plan education issued:  12/12/14 Initial HEP and bed positioning handout, A/E recommendations; 01/31/15 updated RUE functional use HEP   Consulted and Agree with Plan of Care Patient;Family member/caregiver   Family Member Consulted wife [name]        Problem List Patient Active Problem List   Diagnosis Date Noted  . Essential hypertension 01/23/2015  . Hyperlipidemia 01/23/2015  . OSA (obstructive sleep apnea) 01/23/2015  . HLD (hyperlipidemia)   . CVA (cerebral infarction) 11/23/2014  . Stroke ([place]) 11/23/2014  . Hypertension 11/23/2014    Carey Bullocks, OTR/L 02/14/2015, 1:11 PM  [facility] [address] [place], [place], [address] Phone: [phone]   Fax:  [phone]  Name: Lumir Demetriou MRN: [id number] Date of Birth: 27-Mar-1956

## 2015-02-16 ENCOUNTER — Encounter: Payer: Self-pay | Admitting: Occupational Therapy

## 2015-02-16 ENCOUNTER — Ambulatory Visit (INDEPENDENT_AMBULATORY_CARE_PROVIDER_SITE_OTHER): Payer: BLUE CROSS/BLUE SHIELD | Admitting: Neurology

## 2015-02-16 ENCOUNTER — Ambulatory Visit: Payer: BLUE CROSS/BLUE SHIELD | Admitting: Occupational Therapy

## 2015-02-16 ENCOUNTER — Encounter: Payer: Self-pay | Admitting: Neurology

## 2015-02-16 VITALS — BP 108/62 | HR 78 | Resp 16 | Ht 69.0 in | Wt 186.0 lb

## 2015-02-16 DIAGNOSIS — R0681 Apnea, not elsewhere classified: Secondary | ICD-10-CM

## 2015-02-16 DIAGNOSIS — R0683 Snoring: Secondary | ICD-10-CM

## 2015-02-16 DIAGNOSIS — Z8673 Personal history of transient ischemic attack (TIA), and cerebral infarction without residual deficits: Secondary | ICD-10-CM

## 2015-02-16 DIAGNOSIS — G8191 Hemiplegia, unspecified affecting right dominant side: Secondary | ICD-10-CM

## 2015-02-16 DIAGNOSIS — R269 Unspecified abnormalities of gait and mobility: Secondary | ICD-10-CM | POA: Diagnosis not present

## 2015-02-16 DIAGNOSIS — E663 Overweight: Secondary | ICD-10-CM

## 2015-02-16 DIAGNOSIS — M25511 Pain in right shoulder: Secondary | ICD-10-CM

## 2015-02-16 NOTE — Progress Notes (Signed)
Subjective:    Patient ID: Phillip Curry is a 59 y.o. male.  HPI     Star Age, MD, PhD [facility] [address] [place], [place] [address]  Dear Phillip Curry,   I saw your patient, Phillip Curry, upon your kind request in my clinic today for initial consultation of his sleep disorder, in particular, concern for underlying obstructive sleep apnea. The patient is accompanied by his wife today. As you know, Phillip Curry is a 59 year old right-handed gentleman with an underlying medical history of left basal ganglia and corona radiata lacunar stroke in August 2016, hypertension, hyperlipidemia, and overweight state, who reports snoring and  witnessed apneas. His wife has noted apneic breathing pauses while he is asleep. These have improved since his stroke and since he has been able to lose about 23 or 25 pounds since August. He used to work third shift and is still used to going to bed late. He currently goes to bed between 2 and 3 AM and rise time is around 10 AM. His Epworth sleepiness score is 4 out of 24, his fatigue score is 9 out of 63. He denies morning headaches or nocturia. She denies a family history of OSA. He denies restless legs symptoms. He has right-sided weakness from his stroke and right facial droop from his stroke. He is an outpatient therapy. He mobilizes without assistance and does not require assistance at night. He has some right shoulder pain at night. He does not smoke or drink alcohol. He drinks 1 cup of soda or tea per day on average.  His Past Medical History Is Significant For: Past Medical History  Diagnosis Date  . Hypertension   . Stroke [facility])     His Past Surgical History Is Significant For: Past Surgical History  Procedure Laterality Date  . No past surgeries      His Family History Is Significant For: Family History  Problem Relation Age of Onset  . Hypertension Mother   . Pancreatic cancer Mother   . Stroke  Maternal Aunt   . Lung cancer Father   . Cancer Paternal Grandmother   . Emphysema Paternal Grandfather     His Social History Is Significant For: Social History   Social History  . Marital Status: Married    Spouse Name: N/A  . Number of Children: 0  . Years of Education: 45   Social History Main Topics  . Smoking status: Never Smoker   . Smokeless tobacco: None  . Alcohol Use: No  . Drug Use: No  . Sexual Activity: Not Asked   Other Topics Concern  . None   Social History Narrative   8oz of caffeine beverage a day     His Allergies Are:  No Known Allergies:   His Current Medications Are:  Outpatient Encounter Prescriptions as of 02/16/2015  Medication Sig  . aspirin 325 MG tablet Take 1 tablet (325 mg total) by mouth daily.  Marland Kitchen atorvastatin (LIPITOR) 40 MG tablet Take 1 tablet (40 mg total) by mouth daily at 6 PM.  . Multiple Vitamin (MULTIVITAMIN WITH MINERALS) TABS tablet Take 1 tablet by mouth daily.  . Probiotic Product (PROBIOTIC DAILY PO) Take by mouth.  Marland Kitchen UNABLE TO FIND Colon cleansninv  . valsartan-hydrochlorothiazide (DIOVAN-HCT) 160-12.5 MG per tablet Take 1 tablet by mouth daily.   No facility-administered encounter medications on file as of 02/16/2015.  :  Review of Systems:  Out of a complete 14 point review of  systems, all are reviewed and negative with the exception of these symptoms as listed below:   Review of Systems  Neurological:       No trouble falling or staying asleep, sometimes takes naps during day, h/o snoring, witnessed apnea.   Epworth Sleepiness Scale 0= would never doze 1= slight chance of dozing 2= moderate chance of dozing 3= high chance of dozing  Sitting and reading:0 Watching TV:1 Sitting inactive in a public place (ex. Theater or meeting):0 As a passenger in a car for an hour without a break:0 Lying down to rest in the afternoon:2 Sitting and talking to someone:0 Sitting quietly after lunch (no alcohol):1 In a car,  while stopped in traffic:0 Total:4  Objective:  Neurologic Exam  Physical Exam Physical Examination:   Filed Vitals:   02/16/15 1003  BP: 108/62  Pulse: 78  Resp: 16    General Examination: The patient is a very pleasant 59 y.o. male in no acute distress. He appears well-developed and well-nourished and well groomed.   HEENT: Normocephalic, atraumatic, pupils are equal, round and reactive to light and accommodation. Funduscopic exam is normal with sharp disc margins noted. Extraocular tracking is good without limitation to gaze excursion or nystagmus noted. Normal smooth pursuit is noted. Hearing is grossly intact. Face is asymmetric with right facial droop. Speech is slightly dysarthric. He appears to have normal facial sensation. There is no hypophonia. There is no lip, neck/head, jaw or voice tremor. Neck is supple with full range of passive and active motion. There are no carotid bruits on auscultation. Oropharynx exam reveals: mild mouth dryness, good dental hygiene and mild to moderate airway crowding secondary to longer tongue, longer uvula and tonsils of 1+ bilaterally. Mallampati is class II. Tongue protrudes slightly deviated to the right. Neck circumference is 16-3/4 inches. He has a mild overbite. Nasal inspection reveals no significant nasal mucosal bogginess or redness and no septal deviation.   Chest: Clear to auscultation without wheezing, rhonchi or crackles noted.  Heart: S1+S2+0, regular and normal without murmurs, rubs or gallops noted.   Abdomen: Soft, non-tender and non-distended with normal bowel sounds appreciated on auscultation.  Extremities: There is no pitting edema in the distal lower extremities bilaterally. Pedal pulses are intact.  Skin: Warm and dry without trophic changes noted. There are no varicose veins.  Musculoskeletal: exam reveals no obvious joint deformities, tenderness or joint swelling or erythema.   Neurologically:  Mental status: The  patient is awake, alert and oriented in all 4 spheres. His immediate and remote memory, attention, language skills and fund of knowledge are appropriate. There is no evidence of aphasia, agnosia, apraxia or anomia. Speech is slightly dysarthric. He has mild difficulty with comprehension with some delay in responses. Mood is normal and affect is normal.  Cranial nerves II - XII are as described above under HEENT exam. In addition: shoulder shrug is normal with equal shoulder height noted. Motor exam: Normal bulk, strength and tone is notedon the left, he has a mild drift on the right. Romberg is negative. He has mild weakness in the right lower extremity, and 4 out of 5 weakness in the right upper extremity, tandem walk is slightly difficult for him but doable. Sensory exam is intact to light touch.  Reflexes are  2-3+, right side more brisk. 2+ throughout. He stands up with no difficulty, posture is age-appropriate. He walks slightly slowly and cautiously.   Assessment and plan:   In summary, Phillip Curry is a very pleasant  59 y.o.-year old male with an underlying medical history of left basal ganglia and corona radiata lacunar stroke in August 2016, hypertension, hyperlipidemia, and overweight state, whose history and physical exam are indeed concerning for obstructive sleep apnea (OSA). symptoms appear to have improved since he has lost weight since his stroke.  I had a long chat with the patient and his wife about my findings and the diagnosis of OSA, its prognosis and treatment options. We talked about medical treatments, surgical interventions and non-pharmacological approaches. I explained in particular the risks and ramifications of untreated moderate to severe OSA, especially with respect to developing cardiovascular disease down the Road, including congestive heart failure, difficult to treat hypertension, cardiac arrhythmias, or stroke. Even type 2 diabetes has, in part, been linked to untreated  OSA. Symptoms of untreated OSA include daytime sleepiness, memory problems, mood irritability and mood disorder such as depression and anxiety, lack of energy, as well as recurrent headaches, especially morning headaches. We talked about trying to maintain a healthy lifestyle in general, as well as the importance of weight control. I encouraged the patient to eat healthy, exercise daily and keep well hydrated, to keep a scheduled bedtime and wake time routine, to not skip any meals and eat healthy snacks in between meals. I advised the patient not to drive when feeling sleepy. I recommended the following at this time:  given his sleep schedule and his history of stroke with mild right-sided weakness noted, I think it is most feasible for him to pursue a home sleep test. I explained this to the patient and his wife and they are in agreement.   I explained the sleep test procedure to the patient and also outlined possible surgical and non-surgical treatment options of OSA, including the use of a custom-made dental device (which would require a referral to a specialist dentist or oral surgeon), upper airway surgical options, such as pillar implants, radiofrequency surgery, tongue base surgery, and UPPP (which would involve a referral to an ENT surgeon). Rarely, jaw surgery such as mandibular advancement may be considered.  I also explained the CPAP treatment option to the patient, who indicated that he would be willing to try CPAP if the need arises. I explained the importance of being compliant with PAP treatment, not only for insurance purposes but primarily to improve His symptoms, and for the patient's long term health benefit, including to reduce His cardiovascular risks. I answered all their questions today and the patient and his wife were in agreement. I would like to see him back after the sleep study is completed and encouraged him to call with any interim questions, concerns, problems or updates.    Thank you very much for allowing me to participate in the care of this nice patient. If I can be of any further assistance to you please do not hesitate to talk to me.   Sincerely,   Star Age, MD, PhD

## 2015-02-16 NOTE — Patient Instructions (Addendum)
Based on your symptoms and your exam I believe you are at risk for obstructive sleep apnea or OSA, and I think we should proceed with a home sleep test as a screening tool for OSA. If you have more than mild OSA, I want you to consider treatment with CPAP. Please remember, the risks and ramifications of moderate to severe obstructive sleep apnea or OSA are: Cardiovascular disease, including congestive heart failure, stroke, difficult to control hypertension, arrhythmias, and even type 2 diabetes has been linked to untreated OSA. Sleep apnea causes disruption of sleep and sleep deprivation in most cases, which, in turn, can cause recurrent headaches, problems with memory, mood, concentration, focus, and vigilance. Most people with untreated sleep apnea report excessive daytime sleepiness, which can affect their ability to drive. Please do not drive if you feel sleepy.   I will likely see you back after your test to go over the test results and where to go from there. We will call you after your sleep study to advise about the results (most likely, you will hear from Beverlee Nims, my nurse) and to set up an appointment at the time, as necessary.    Our sleep lab administrative assistant, Arrie Aran will meet with you or call you to schedule your study. If you don't hear back from her by next week please feel free to call her at [phone]. This is her direct line and please leave a message with your phone number to call back if you get the voicemail box. She will call back as soon as possible.

## 2015-02-16 NOTE — Therapy (Signed)
[facility] [address] [place], [place], [address] Phone: [phone]   Fax:  [phone]  Occupational Therapy Treatment  Patient Details  Name: Phillip Curry MRN: [id number] Date of Birth: 04-09-1956 No Data Recorded  Encounter Date: 02/16/2015      OT End of Session - 02/16/15 1153    Visit Number 22   Number of Visits 33   Date for OT Re-Evaluation 04/01/15   Authorization Type [employer] 120 visits combined, no auth   Authorization Time Period renewal completed 01/31/15, wk 2/8   Authorization - Number of Visits 40   OT Start Time 1105   OT Stop Time 1150   OT Time Calculation (min) 45 min   Activity Tolerance Patient tolerated treatment well      Past Medical History  Diagnosis Date  . Hypertension   . Stroke [facility])     Past Surgical History  Procedure Laterality Date  . No past surgeries      There were no vitals filed for this visit.  Visit Diagnosis:  Hemiplegia affecting right dominant side (HCC)  Pain in joint of right shoulder      Subjective Assessment - 02/16/15 1110    Subjective  My pain has been a little better in the shoulder, but it hurt some last night in the bed   Patient is accompained by: Family member   Pertinent History see Epic snapshot   Currently in Pain? Yes   Pain Score 2    Pain Location Shoulder   Pain Orientation Right   Pain Descriptors / Indicators Tightness;Sore   Pain Type Acute pain   Pain Onset 1 to 4 weeks ago   Aggravating Factors  at night   Pain Relieving Factors stretching, proper positioning        TREATMENT:  Pt shown proper bed positioning and reviewed (provided another handout). Focus today on proper positioning with sh. Extension and ER stretch as well as during horizontal abduction. Pt performing AA/ROM in horizontal abduction with arm supported on ball, then with therapist with min tactile cues to scapula initially to prevent pain. Pt also  performed AA/ROM in bilateral shoulder flexion with ball, high range in supine, and to eye level seated. Performed stretching in sh. Extension and ER with mod tactile and v.c's for scapula retraction and downward depression.                     OT Education - 02/16/15 1155    Education provided Yes   Education Details review of bed positioning and provided another handout, discussed proper positioning during sh. extension and ER stretch and with horizontal abduction   Person(s) Educated Patient;Spouse   Methods Explanation;Demonstration   Comprehension Verbalized understanding;Returned demonstration          OT Short Term Goals - 01/31/15 1025    OT SHORT TERM GOAL #1   Title I with HEP   Baseline 01/07/15   Time 4   Period Weeks   Status Achieved   OT SHORT TERM GOAL #2   Title Pt will perform bathing with min A   Time 4   Period Weeks   Status Achieved   OT SHORT TERM GOAL #3   Title Pt will perform dressing with supervision/ setup   Time 4   Period Weeks   Status Achieved  except tying shoes (pt leaves tied. O.T. shown alternative options/AE)   OT SHORT TERM GOAL #4   Title Pt  will use RUE as a stabilizer with minA   Time 4   Period Weeks   Status Achieved  01/31/15   OT SHORT TERM GOAL #5   Title Pt will demonstrate 50% A/ROM elbow flexion/ extension.   Time 4   Period Weeks   Status Achieved   Additional Short Term Goals   Additional Short Term Goals Yes   OT SHORT TERM GOAL #6   Title Pt will be modified independent with RUE positioning to minimize pain, risk for injury including splint wear PRN.   Time 4   Period Weeks   Status Achieved   OT SHORT TERM GOAL #7   Title Pt will use RUE as a stabilizer/ gross A for ADLs at least 50% of the time.--check updated STGs 03/02/15   Baseline new for renewal 01/31/15   Time 4   Period Weeks   Status New   OT SHORT TERM GOAL #8   Title Pt will demo at least 100* R shoulder flex with minimal  compensation for functional reaching.   Baseline new for renewal 01/31/15   Time 4   Period Weeks   Status New   OT SHORT TERM GOAL  #9   TITLE Pt will improve RUE coordination/functional reaching for ADLs as shown by improving score on box and blocks test by at least 5 with RUE.   Baseline new for renewal 01/31/15- 6 blocks    Time 4   Period Weeks   Status New           OT Long Term Goals - 01/31/15 1025    OT LONG TERM GOAL #1   Title Pt will use RUE as a stabilizer/ gross A for ADLs at least 25% of the time.   Baseline due 02/05/15--will update as STG for renewal period   Time 8   Period Weeks   Status Not Met  01/31/15  approx 10% per pt report   OT LONG TERM GOAL #2   Title Pt will demonstrate 30* shoulder flexion/ scaption in prep for functional reach.   Time 8   Period Weeks   Status Achieved  01/31/15 90* with minimal compensation   OT LONG TERM GOAL #3   Title Pt will perform all basic ADLs with distant supervision.   Time 8   Period Weeks   Status Achieved  01/31/15 met per pt except tying shoes (has been instructed in AE for this)   OT LONG TERM GOAL #4   Title Pt will perfom simple snack prep/ light home management with supervision.   Time 8   Period Weeks   Status Achieved  01/31/15 cold snack prep   OT LONG TERM GOAL #5   Title Pt will use RUE as a stabilizer/ gross A for ADLs at least 75% of the time.--check updated LTGs 04/01/15   Time 8   Period Weeks   Status New   Long Term Additional Goals   Additional Long Term Goals Yes   OT LONG TERM GOAL #6   Title Pt will demo at least 110* R shoulder flex with minimal compensation for functional reaching.   Time 8   Period Weeks   Status New   OT LONG TERM GOAL #7   Title Pt will improve RUE coordination/functional reaching for ADLs as shown by improving score on box and blocks test by at least 10 with RUE.   Baseline 6 blocks 01/31/15   Time 8   Period Weeks   Status  New                Plan - 02/16/15 1153    Clinical Impression Statement Pt reports shoulder pain improved but still hurting at night and with certain movements. Therapist focused on shoulder stretches and proper positioning at night and during movement today   Plan assess shoulder pain and address prn, if pain controlled - consider beginning fabrication of full composite extension splint   OT Home Exercise Plan education issued:  12/12/14 Initial HEP and bed positioning handout, A/E recommendations; 01/31/15 updated RUE functional use HEP   Consulted and Agree with Plan of Care Patient;Family member/caregiver   Family Member Consulted wife [name]        Problem List Patient Active Problem List   Diagnosis Date Noted  . Essential hypertension 01/23/2015  . Hyperlipidemia 01/23/2015  . OSA (obstructive sleep apnea) 01/23/2015  . HLD (hyperlipidemia)   . CVA (cerebral infarction) 11/23/2014  . Stroke ([place]) 11/23/2014  . Hypertension 11/23/2014    Carey Bullocks, OTR/L 02/16/2015, 11:58 AM  [facility] [address] [place], [place], [address] Phone: [phone]   Fax:  [phone]  Name: Phillip Curry MRN: [id number] Date of Birth: 1956/01/09

## 2015-02-21 ENCOUNTER — Ambulatory Visit: Payer: BLUE CROSS/BLUE SHIELD | Attending: Internal Medicine | Admitting: Occupational Therapy

## 2015-02-21 ENCOUNTER — Encounter: Payer: Self-pay | Admitting: Occupational Therapy

## 2015-02-21 DIAGNOSIS — R531 Weakness: Secondary | ICD-10-CM | POA: Insufficient documentation

## 2015-02-21 DIAGNOSIS — R279 Unspecified lack of coordination: Secondary | ICD-10-CM | POA: Diagnosis present

## 2015-02-21 DIAGNOSIS — G8191 Hemiplegia, unspecified affecting right dominant side: Secondary | ICD-10-CM | POA: Diagnosis present

## 2015-02-21 DIAGNOSIS — M25511 Pain in right shoulder: Secondary | ICD-10-CM | POA: Insufficient documentation

## 2015-02-21 DIAGNOSIS — I6980 Unspecified sequelae of other cerebrovascular disease: Secondary | ICD-10-CM | POA: Insufficient documentation

## 2015-02-21 DIAGNOSIS — I69898 Other sequelae of other cerebrovascular disease: Secondary | ICD-10-CM | POA: Diagnosis present

## 2015-02-21 DIAGNOSIS — R41841 Cognitive communication deficit: Secondary | ICD-10-CM | POA: Diagnosis present

## 2015-02-21 NOTE — Therapy (Signed)
[facility] [address] [place], [place], [address] Phone: [phone]   Fax:  [phone]  Occupational Therapy Treatment  Patient Details  Name: Phillip Curry MRN: [id number] Date of Birth: 1955-05-18 No Data Recorded  Encounter Date: 02/21/2015      OT End of Session - 02/21/15 1439    Visit Number 23   Number of Visits 33   Date for OT Re-Evaluation 04/01/15   Authorization Type [employer] 120 visits combined, no auth   Authorization Time Period renewal completed 01/31/15, wk 3/8   OT Start Time 1315   OT Stop Time 1410   OT Time Calculation (min) 55 min   Activity Tolerance Patient tolerated treatment well      Past Medical History  Diagnosis Date  . Hypertension   . Stroke [facility])     Past Surgical History  Procedure Laterality Date  . No past surgeries      There were no vitals filed for this visit.  Visit Diagnosis:  Pain in joint of right shoulder  Hemiplegia affecting right dominant side (HCC)      Subjective Assessment - 02/21/15 1323    Subjective  I still have some pain   Pertinent History see Epic snapshot   Patient Stated Goals to get better   Currently in Pain? Yes   Pain Score 4    Pain Location Shoulder   Pain Orientation Right   Pain Descriptors / Indicators Tightness;Sore   Pain Type Acute pain   Pain Onset 1 to 4 weeks ago   Pain Frequency Intermittent   Aggravating Factors  NOTHING   Pain Relieving Factors stretching, proper positioning                      OT Treatments/Exercises (OP) - 02/21/15 0001    Neurological Re-education Exercises   Other Exercises 1 supine: AA/ROM in shoulder flexion with physioball followed by seated position in AA/ROM in midrange shoulder flexion, abduction without pain. When attempting to stretch in some ER and shoulder extension, pt has mild pain   Electrical Stimulation   Electrical Stimulation Location dorsal forearm   Electrical Stimulation Action wrist and finger extension   Electrical Stimulation Parameters 10 on/10 off cyle, 50 pps, 248 pw x 15 minutes while therapist simultaneously beginning fabrication of full composite extension splint   Electrical Stimulation Goals Neuromuscular facilitation   Splinting   Splinting Began fabrication of full composite extension splint (while pt on estim for wrist/fingers). Then completed fabrication of splint after estim. However, all hooks/straps will need to be applied next session and will issue next session   Manual Therapy   Manual Therapy Taping   Manual therapy comments To relax middle deltoid (pain site), facilitate scapula retraction and trunk extension, and correction piece for ER at [facility] joint   Kinesiotex Inhibit Muscle;Facilitate Muscle                  OT Short Term Goals - 01/31/15 1025    OT SHORT TERM GOAL #1   Title I with HEP   Baseline 01/07/15   Time 4   Period Weeks   Status Achieved   OT SHORT TERM GOAL #2   Title Pt will perform bathing with min A   Time 4   Period Weeks   Status Achieved   OT SHORT TERM GOAL #3   Title Pt will perform dressing with supervision/ setup   Time 4   Period  Weeks   Status Achieved  except tying shoes (pt leaves tied. O.T. shown alternative options/AE)   OT SHORT TERM GOAL #4   Title Pt will use RUE as a stabilizer with minA   Time 4   Period Weeks   Status Achieved  01/31/15   OT SHORT TERM GOAL #5   Title Pt will demonstrate 50% A/ROM elbow flexion/ extension.   Time 4   Period Weeks   Status Achieved   Additional Short Term Goals   Additional Short Term Goals Yes   OT SHORT TERM GOAL #6   Title Pt will be modified independent with RUE positioning to minimize pain, risk for injury including splint wear PRN.   Time 4   Period Weeks   Status Achieved   OT SHORT TERM GOAL #7   Title Pt will use RUE as a stabilizer/ gross A for ADLs at least 50% of the time.--check updated STGs 03/02/15    Baseline new for renewal 01/31/15   Time 4   Period Weeks   Status New   OT SHORT TERM GOAL #8   Title Pt will demo at least 100* R shoulder flex with minimal compensation for functional reaching.   Baseline new for renewal 01/31/15   Time 4   Period Weeks   Status New   OT SHORT TERM GOAL  #9   TITLE Pt will improve RUE coordination/functional reaching for ADLs as shown by improving score on box and blocks test by at least 5 with RUE.   Baseline new for renewal 01/31/15- 6 blocks    Time 4   Period Weeks   Status New           OT Long Term Goals - 01/31/15 1025    OT LONG TERM GOAL #1   Title Pt will use RUE as a stabilizer/ gross A for ADLs at least 25% of the time.   Baseline due 02/05/15--will update as STG for renewal period   Time 8   Period Weeks   Status Not Met  01/31/15  approx 10% per pt report   OT LONG TERM GOAL #2   Title Pt will demonstrate 30* shoulder flexion/ scaption in prep for functional reach.   Time 8   Period Weeks   Status Achieved  01/31/15 90* with minimal compensation   OT LONG TERM GOAL #3   Title Pt will perform all basic ADLs with distant supervision.   Time 8   Period Weeks   Status Achieved  01/31/15 met per pt except tying shoes (has been instructed in AE for this)   OT LONG TERM GOAL #4   Title Pt will perfom simple snack prep/ light home management with supervision.   Time 8   Period Weeks   Status Achieved  01/31/15 cold snack prep   OT LONG TERM GOAL #5   Title Pt will use RUE as a stabilizer/ gross A for ADLs at least 75% of the time.--check updated LTGs 04/01/15   Time 8   Period Weeks   Status New   Long Term Additional Goals   Additional Long Term Goals Yes   OT LONG TERM GOAL #6   Title Pt will demo at least 110* R shoulder flex with minimal compensation for functional reaching.   Time 8   Period Weeks   Status New   OT LONG TERM GOAL #7   Title Pt will improve RUE coordination/functional reaching for ADLs as  shown by improving score  on box and blocks test by at least 10 with RUE.   Baseline 6 blocks 01/31/15   Time 8   Period Weeks   Status New               Plan - 02/21/15 1440    Clinical Impression Statement Pt limited in progression due to recent shoulder pain developing. Pt still able to perform higher level stretches and low level open chain reaching   Plan assess shoulder pain and effects of kinesiotape, complete full composite extension splint and practice donning/doffing, splint wear and care and issue splint. If time allows, pt can do estim or UBE while finishing splint   Consulted and Agree with Plan of Care Patient        Problem List Patient Active Problem List   Diagnosis Date Noted  . Essential hypertension 01/23/2015  . Hyperlipidemia 01/23/2015  . OSA (obstructive sleep apnea) 01/23/2015  . HLD (hyperlipidemia)   . CVA (cerebral infarction) 11/23/2014  . Stroke ([place]) 11/23/2014  . Hypertension 11/23/2014    Carey Bullocks, OTR/L 02/21/2015, 2:44 PM  [facility] [address] [place], [place], [address] Phone: [phone]   Fax:  [phone]  Name: Magnum Lunde MRN: [id number] Date of Birth: 1955-06-27

## 2015-02-22 ENCOUNTER — Ambulatory Visit: Payer: BLUE CROSS/BLUE SHIELD | Admitting: Occupational Therapy

## 2015-02-22 DIAGNOSIS — G8191 Hemiplegia, unspecified affecting right dominant side: Secondary | ICD-10-CM

## 2015-02-22 DIAGNOSIS — IMO0002 Reserved for concepts with insufficient information to code with codable children: Secondary | ICD-10-CM

## 2015-02-22 DIAGNOSIS — M25511 Pain in right shoulder: Secondary | ICD-10-CM | POA: Diagnosis not present

## 2015-02-22 NOTE — Therapy (Signed)
[facility] [address] [place], [place], [address] Phone: [phone]   Fax:  [phone]  Occupational Therapy Treatment  Patient Details  Name: Phillip Curry MRN: [id number] Date of Birth: [birth year] No Data Recorded  Encounter Date: 02/22/2015      OT End of Session - 02/22/15 1309    Visit Number 24   Number of Visits 33   Date for OT Re-Evaluation 04/01/15   Authorization Type [employer] 120 visits combined, no auth   Authorization Time Period renewal completed 01/31/15, wk 3/8   OT Start Time 0935   OT Stop Time 1015   OT Time Calculation (min) 40 min      Past Medical History  Diagnosis Date  . Hypertension   . Stroke [facility])     Past Surgical History  Procedure Laterality Date  . No past surgeries      There were no vitals filed for this visit.  Visit Diagnosis:  Weakness due to cerebrovascular accident  Hemiplegia affecting right dominant side ([place])  Lack of coordination due to stroke      Subjective Assessment - 02/22/15 0934    Patient is accompained by: Family member   Pertinent History see Epic snapshot   Patient Stated Goals to get better   Pain Score 4    Pain Location Shoulder   Pain Orientation Right   Pain Descriptors / Indicators Tightness;Sore   Pain Type Acute pain   Pain Onset 1 to 4 weeks ago   Pain Frequency Intermittent   Aggravating Factors  unknown   Pain Relieving Factors proper positioning   Multiple Pain Sites No       Treatment: Therapist completed fitting of full composite extension splint and educated pt in splint wear, care and precautions. Pt returned demonstration of application following instruction.                         OT Short Term Goals - 01/31/15 1025    OT SHORT TERM GOAL #1   Title I with HEP   Baseline 01/07/15   Time 4   Period Weeks   Status Achieved   OT SHORT TERM GOAL #2   Title Pt will perform bathing with min A   Time 4   Period Weeks   Status Achieved   OT SHORT TERM GOAL #3   Title Pt will perform dressing with supervision/ setup   Time 4   Period Weeks   Status Achieved  except tying shoes (pt leaves tied. O.T. shown alternative options/AE)   OT SHORT TERM GOAL #4   Title Pt will use RUE as a stabilizer with minA   Time 4   Period Weeks   Status Achieved  01/31/15   OT SHORT TERM GOAL #5   Title Pt will demonstrate 50% A/ROM elbow flexion/ extension.   Time 4   Period Weeks   Status Achieved   Additional Short Term Goals   Additional Short Term Goals Yes   OT SHORT TERM GOAL #6   Title Pt will be modified independent with RUE positioning to minimize pain, risk for injury including splint wear PRN.   Time 4   Period Weeks   Status Achieved   OT SHORT TERM GOAL #7   Title Pt will use RUE as a stabilizer/ gross A for ADLs at least 50% of the time.--check updated STGs 03/02/15   Baseline new for renewal 01/31/15   Time  4   Period Weeks   Status New   OT SHORT TERM GOAL #8   Title Pt will demo at least 100* R shoulder flex with minimal compensation for functional reaching.   Baseline new for renewal 01/31/15   Time 4   Period Weeks   Status New   OT SHORT TERM GOAL  #9   TITLE Pt will improve RUE coordination/functional reaching for ADLs as shown by improving score on box and blocks test by at least 5 with RUE.   Baseline new for renewal 01/31/15- 6 blocks    Time 4   Period Weeks   Status New           OT Long Term Goals - 01/31/15 1025    OT LONG TERM GOAL #1   Title Pt will use RUE as a stabilizer/ gross A for ADLs at least 25% of the time.   Baseline due 02/05/15--will update as STG for renewal period   Time 8   Period Weeks   Status Not Met  01/31/15  approx 10% per pt report   OT LONG TERM GOAL #2   Title Pt will demonstrate 30* shoulder flexion/ scaption in prep for functional reach.   Time 8   Period Weeks   Status Achieved  01/31/15 90* with minimal  compensation   OT LONG TERM GOAL #3   Title Pt will perform all basic ADLs with distant supervision.   Time 8   Period Weeks   Status Achieved  01/31/15 met per pt except tying shoes (has been instructed in AE for this)   OT LONG TERM GOAL #4   Title Pt will perfom simple snack prep/ light home management with supervision.   Time 8   Period Weeks   Status Achieved  01/31/15 cold snack prep   OT LONG TERM GOAL #5   Title Pt will use RUE as a stabilizer/ gross A for ADLs at least 75% of the time.--check updated LTGs 04/01/15   Time 8   Period Weeks   Status New   Long Term Additional Goals   Additional Long Term Goals Yes   OT LONG TERM GOAL #6   Title Pt will demo at least 110* R shoulder flex with minimal compensation for functional reaching.   Time 8   Period Weeks   Status New   OT LONG TERM GOAL #7   Title Pt will improve RUE coordination/functional reaching for ADLs as shown by improving score on box and blocks test by at least 10 with RUE.   Baseline 6 blocks 01/31/15   Time 8   Period Weeks   Status New               Plan - 02/22/15 1307    Clinical Impression Statement Therapist completed fitting with full composite extension splint with patient. Pt reports mild improvement in shoulder pain with application of tape.   Pt will benefit from skilled therapeutic intervention in order to improve on the following deficits (Retired) Abnormal gait;Decreased coordination;Decreased range of motion;Difficulty walking;Decreased endurance;Decreased activity tolerance;Decreased knowledge of precautions;Impaired tone;Pain;Impaired UE functional use;Decreased knowledge of use of DME;Decreased cognition;Decreased mobility;Decreased strength;Impaired perceived functional ability   Rehab Potential Good   OT Frequency 2x / week   OT Duration 8 weeks   OT Treatment/Interventions Self-care/ADL training;Moist Heat;Fluidtherapy;DME and/or AE instruction;Patient/family education;Balance  training;Therapeutic exercises;Contrast Bath;Ultrasound;Therapeutic exercise;Therapeutic activities;Cognitive remediation/compensation;Passive range of motion;Functional Mobility Training;Neuromuscular education;Cryotherapy;Electrical Stimulation;Parrafin;Energy conservation;Manual Therapy;Visual/perceptual remediation/compensation   Plan assess how full composite  ext splint wear is going, neuro re-ed   OT Home Exercise Plan education issued:  12/12/14 Initial HEP and bed positioning handout, A/E recommendations; 01/31/15 updated RUE functional use HEP, full composite ext splint issued 11/2   Consulted and Agree with Plan of Care Patient        Problem List Patient Active Problem List   Diagnosis Date Noted  . Essential hypertension 01/23/2015  . Hyperlipidemia 01/23/2015  . OSA (obstructive sleep apnea) 01/23/2015  . HLD (hyperlipidemia)   . CVA (cerebral infarction) 11/23/2014  . Stroke ([place]) 11/23/2014  . Hypertension 11/23/2014    [facility] 02/22/2015, 1:11 PM Theone Murdoch, OTR/L [phone] Phone: [phone] 1:11 PM 02/22/2015 [facility] [address] [place], [place], [address] Phone: [phone]   Fax:  [phone]  Name: Phillip Curry MRN: [id number] Date of Birth: 03/02/1956

## 2015-02-22 NOTE — Patient Instructions (Signed)
Your Splint This splint should initially be fitted by a healthcare practitioner.  The healthcare practitioner is responsible for providing wearing instructions and precautions to the patient, other healthcare practitioners and care provider involved in the patient's care.  This splint was custom made for you. Please read the following instructions to learn about wearing and caring for your splint.  Precautions Should your splint cause any of the following problems, remove the splint immediately and contact your therapist/physician.  Swelling  Severe Pain  Pressure Areas  Stiffness  Numbness  Do not wear your splint while operating machinery unless it has been fabricated for that purpose.  When To Wear Your Splint Where your splint according to your therapist/physician instructions. Daytime for 20 mins only 1x today, you may increase to wearing splint for 20 mins only 2-3x day Do not wear splint longer than 20 mins at a time  Care and Cleaning of Your Splint 1. Keep your splint away from open flames. 2. Your splint will lose its shape in temperatures over 135 degrees Farenheit, ( in car windows, near radiators, ovens or in hot water).  Never make any adjustments to your splint, if the splint needs adjusting remove it and make an appointment to see your therapist. 3. Your splint, including the cushion liner may be cleaned with soap and lukewarm water.  Do not immerse in hot water over 135 degrees Farenheit. 4. Straps may be washed with soap and water, but do not moisten the self-adhesive portion

## 2015-02-23 ENCOUNTER — Ambulatory Visit: Payer: BLUE CROSS/BLUE SHIELD | Admitting: Speech Pathology

## 2015-02-23 DIAGNOSIS — R41841 Cognitive communication deficit: Secondary | ICD-10-CM

## 2015-02-23 DIAGNOSIS — M25511 Pain in right shoulder: Secondary | ICD-10-CM | POA: Diagnosis not present

## 2015-02-23 NOTE — Therapy (Signed)
[facility] [address] [place], [place], [address] Phone: [phone]   Fax:  [phone]  Speech Language Pathology Treatment  Patient Details  Name: Phillip Curry MRN: [id number] Date of Birth: 01/23/1956 No Data Recorded  Encounter Date: 02/23/2015      End of Session - 02/23/15 1207    Visit Number 21   Number of Visits 35   Date for SLP Re-Evaluation 03/30/15   SLP Start Time 1017   SLP Stop Time  1101   SLP Time Calculation (min) 44 min   Activity Tolerance Patient tolerated treatment well      Past Medical History  Diagnosis Date  . Hypertension   . Stroke (HCC)     Past Surgical History  Procedure Laterality Date  . No past surgeries      There were no vitals filed for this visit.  Visit Diagnosis: Cognitive communication deficit      Subjective Assessment - 02/23/15 1027    Subjective Pt rated homework "5" with 1 being easy and 10 being so hard that he couldn't do it"               ADULT SLP TREATMENT - 02/23/15 1042    General Information   Behavior/Cognition Pleasant mood;Cooperative;Requires cueing   Treatment Provided   Treatment provided Cognitive-Linquistic   Cognitive-Linquistic Treatment   Treatment focused on Cognition   Skilled Treatment Attention to detail and alternating attention facilitated with sorting cards inot 3 different piles, each pile with a different rule - pt attended to all piles 75% of trials, occasional to usual min cues  and extended time to process each card and rule. Pt benefits from consistently verbaliizing rules for each sort to aid processing. Aphasic errors  persist, however cognition has been most affected, therefore our focus. I provided pt word finding homework as well as    Assessment / Recommendations / Plan   Plan Continue with current plan of care   Progression Toward Goals   Progression toward goals Progressing toward goals           SLP Education - 02/23/15 1203    Education provided Yes   Education Details word finding deficits noted, will continue to address through homework    Person(s) Educated Patient   Methods Explanation   Comprehension Verbalized understanding          SLP Short Term Goals - 02/09/15 1111    SLP SHORT TERM GOAL #1   Title Pt will perform dysarthria HEP with rare minimal assistance   Time 1   Period Weeks   Status Achieved   SLP SHORT TERM GOAL #2   Title Pt will solve simple time, money, reasoning problems with 85% accuracy and occasional min A   Time 1   Period Weeks   Status Not Met   SLP SHORT TERM GOAL #3   Title Pt will manage meds at home with compensations with rare min A over 3 sessions as reported by spouse/pt.   Time 1   Period Weeks   Status Achieved          SLP Long Term Goals - 02/23/15 1206    SLP LONG TERM GOAL #1   Title Pt will be 90% intelligible in moderately complex conversation over 12 minutes   Time 1   Period Weeks   Status Achieved   SLP LONG TERM GOAL #2   Title Pt will solve simple time, money reasoning problems with   80% accuracy and occasional min A   Time 6   Period Weeks   Status On-going   SLP LONG TERM GOAL #3   Title Pt will utilize compensations for schedule, daily chores, lists 2x during therapy session over 2 sessions with rare min A   Time 1   Period Weeks   Status Achieved   SLP LONG TERM GOAL #4   Title pt will demo emergent awareness in cognitive linguistic tasks with mod A occasionally   Time 6   Period Weeks   Status On-going   SLP LONG TERM GOAL #5   Title Pt will solve simple reasoning and organization cogntive linguistic problems with 80% accuracy and occasinal min A   Time 6   Period Weeks   Status On-going          Plan - 02/23/15 1203    Clinical Impression Statement Pt. required min to mod A for alternating attention, organization and problem solving. We discussed his difficulty with word finding today, he  verbalizes awareness of this. I provided homework for attention to detail, math word problems and word finding. Homework is due next week. Continue skilled ST to maximize cognition for reduced caregiver burden and eventual/possible return to work.   Speech Therapy Frequency 2x / week   Treatment/Interventions Compensatory strategies;Patient/family education;Functional tasks;Cognitive reorganization;Internal/external aids;SLP instruction and feedback   Potential to Achieve Goals Good   Potential Considerations Ability to learn/carryover information;Severity of impairments   Consulted and Agree with Plan of Care Patient        Problem List Patient Active Problem List   Diagnosis Date Noted  . Essential hypertension 01/23/2015  . Hyperlipidemia 01/23/2015  . OSA (obstructive sleep apnea) 01/23/2015  . HLD (hyperlipidemia)   . CVA (cerebral infarction) 11/23/2014  . Stroke ([place]) 11/23/2014  . Hypertension 11/23/2014    Lovvorn, Annye Rusk MS, CCC-SLP 02/23/2015, 12:08 PM  [facility] [address] [place], [place], [address] Phone: [phone]   Fax:  [phone]   Name: Phillip Curry MRN: [id number] Date of Birth: 11/08/1955

## 2015-02-23 NOTE — Patient Instructions (Signed)
Homework provided for organization, math problem solving and word finding

## 2015-02-24 ENCOUNTER — Ambulatory Visit: Payer: BLUE CROSS/BLUE SHIELD

## 2015-02-24 DIAGNOSIS — M25511 Pain in right shoulder: Secondary | ICD-10-CM | POA: Diagnosis not present

## 2015-02-24 DIAGNOSIS — R41841 Cognitive communication deficit: Secondary | ICD-10-CM

## 2015-02-24 NOTE — Patient Instructions (Signed)
  Please complete the assigned speech therapy homework and return it to your next session.  

## 2015-02-24 NOTE — Therapy (Signed)
[facility] [address] [place], [place], [address] Phone: [phone]   Fax:  [phone]  Speech Language Pathology Treatment  Patient Details  Name: Phillip Curry MRN: [id number] Date of Birth: 1956-03-18 No Data Recorded  Encounter Date: 02/24/2015      End of Session - 02/24/15 1058    Visit Number 22   Number of Visits 35   Date for SLP Re-Evaluation 03/30/15   SLP Start Time 71   SLP Stop Time  1100   SLP Time Calculation (min) 40 min   Activity Tolerance Patient tolerated treatment well      Past Medical History  Diagnosis Date  . Hypertension   . Stroke [facility])     Past Surgical History  Procedure Laterality Date  . No past surgeries      There were no vitals filed for this visit.  Visit Diagnosis: Cognitive communication deficit      Subjective Assessment - 02/24/15 1025    Subjective Homework rated 5 (1=easy, 10=so hard he couldn't do it)               ADULT SLP TREATMENT - 02/24/15 1033    General Information   Behavior/Cognition Pleasant mood;Cooperative;Requires cueing   Treatment Provided   Treatment provided Cognitive-Linquistic   Pain Assessment   Pain Assessment No/denies pain   Cognitive-Linquistic Treatment   Treatment focused on Cognition   Skilled Treatment Alternating attention between paper and written response as well as attention to detail with functional graphing task today. Pt wrote one name twice and demo'd emergent awareness to figure out where he went wrong. Pt with extra time necessary - took him 20 minutes to complete work hours question. Pt req'd mod cues from SLP to organize information to figure number of hours each week.   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   Time 1   Period Weeks   Status Achieved          SLP Long Term Goals - 02/24/15 1100    SLP LONG TERM GOAL #1   Title Pt will be 90% intelligible in moderately complex conversation over 12 minutes   Time 1   Period Weeks   Status Achieved   SLP LONG TERM GOAL #2   Title Pt will solve simple time, money reasoning problems with 80% accuracy and occasional min A   Time 6  Period Weeks   Status On-going   SLP LONG TERM GOAL #3   Title Pt will utilize compensations for schedule, daily chores, lists 2x during therapy session over 2 sessions with rare min A   Time 1   Period Weeks   Status Achieved   SLP LONG TERM GOAL #4   Title pt will demo emergent awareness in cognitive linguistic tasks with mod A occasionally   Time 6   Period Weeks   Status On-going   SLP LONG TERM GOAL #5   Title Pt will solve simple reasoning and organization cogntive linguistic problems with 80% accuracy and occasinal min A   Time 6   Period Weeks   Status On-going          Plan - 02/24/15 1058    Clinical Impression Statement Assistance cont to be needed with alternating attention as well as organization/problem solving. Maximizing his cognitive deficits as well as language output is still necessary for skilled ST to address.    Speech Therapy Frequency 2x / week   Treatment/Interventions Compensatory strategies;Patient/family education;Functional tasks;Cognitive reorganization;Internal/external aids;SLP instruction and feedback   Potential to Achieve Goals Good   Potential Considerations Ability to learn/carryover information;Severity of impairments        Problem List Patient Active Problem List   Diagnosis Date Noted  . Essential hypertension 01/23/2015  . Hyperlipidemia 01/23/2015  . OSA (obstructive sleep apnea) 01/23/2015  . HLD (hyperlipidemia)   . CVA (cerebral infarction) 11/23/2014  . Stroke ([place]) 11/23/2014  . Hypertension 11/23/2014    [facility] , [place], [place]  02/24/2015, 11:02 AM  [facility] [address] [place], [place], [address] Phone: [phone]   Fax:  [phone]   Name: Phillip Curry MRN: [id number] Date of Birth: 1955-12-27

## 2015-02-27 ENCOUNTER — Ambulatory Visit: Payer: BLUE CROSS/BLUE SHIELD

## 2015-02-27 ENCOUNTER — Ambulatory Visit: Payer: BLUE CROSS/BLUE SHIELD | Admitting: Occupational Therapy

## 2015-02-27 DIAGNOSIS — G8191 Hemiplegia, unspecified affecting right dominant side: Secondary | ICD-10-CM

## 2015-02-27 DIAGNOSIS — M25511 Pain in right shoulder: Secondary | ICD-10-CM

## 2015-02-27 DIAGNOSIS — IMO0002 Reserved for concepts with insufficient information to code with codable children: Secondary | ICD-10-CM

## 2015-02-27 DIAGNOSIS — R41841 Cognitive communication deficit: Secondary | ICD-10-CM

## 2015-02-27 NOTE — Therapy (Signed)
[facility] [address] [place], [place], [address] Phone: [phone]   Fax:  [phone]  Speech Language Pathology Treatment  Patient Details  Name: Phillip Curry MRN: [id number] Date of Birth: November 06, 1955 No Data Recorded  Encounter Date: 02/27/2015      End of Session - 02/27/15 1422    Visit Number 23   Number of Visits 35   Date for SLP Re-Evaluation 03/30/15   SLP Start Time 0849   SLP Stop Time  0930   SLP Time Calculation (min) 41 min   Activity Tolerance Patient tolerated treatment well      Past Medical History  Diagnosis Date  . Hypertension   . Stroke [facility])     Past Surgical History  Procedure Laterality Date  . No past surgeries      There were no vitals filed for this visit.  Visit Diagnosis: Cognitive communication deficit      Subjective Assessment - 02/27/15 0902    Subjective Homework rated 5/10 (1=easy, 10=so hard he could not do it)   Patient is accompained by: Family member               ADULT SLP TREATMENT - 02/27/15 0904    General Information   Behavior/Cognition Pleasant mood;Cooperative;Requires cueing   Treatment Provided   Treatment provided Cognitive-Linquistic   Pain Assessment   Pain Assessment No/denies pain   Cognitive-Linquistic Treatment   Treatment focused on Cognition   Skilled Treatment Pt having difficulty organizing his green paper folder to find homework. SLP encouraged pt to transfer the papers in the green folder to the three ring binder for next visit. Pt with 3/5 of homework done. Alternating attention between four categories (letter fill ins) with pt writing responses on separate sheet of paper - mod A needed to track from section to section (a cross-pattern, not a linear pattern). After min cues from SLP, pt suggested to track responses by chekcing responses off, noted difficulty with moving from one category to another, requiring initial mod  cues usually faded to min-mod cues occasionally. SLP noted pt with some emergent awareness with homework tasks, but less than WNL for simple-mod complex tasks..   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   Time 1   Period Weeks   Status Achieved          SLP Long Term Goals - 02/27/15 1423    SLP LONG TERM  GOAL #1   Title Pt will be 90% intelligible in moderately complex conversation over 12 minutes   Time 1   Period Weeks   Status Achieved   SLP LONG TERM GOAL #2   Title Pt will solve simple time, money reasoning problems with 80% accuracy and occasional min A   Time 6   Period Weeks   Status On-going   SLP LONG TERM GOAL #3   Title Pt will utilize compensations for schedule, daily chores, lists 2x during therapy session over 2 sessions with rare min A   Time 1   Period Weeks   Status Achieved   SLP LONG TERM GOAL #4   Title pt will demo emergent awareness in cognitive linguistic tasks with mod A occasionally   Time 6   Period Weeks   Status On-going   SLP LONG TERM GOAL #5   Title Pt will solve simple reasoning and organization  cogntive linguistic problems with 80% accuracy and occasinal min A   Time 6   Period Weeks   Status On-going          Plan - 02/27/15 1422    Clinical Impression Statement Assistance cont to be needed with alternating attention as well as organization/problem solving. Maximizing his cognitive deficits as well as language output is still necessary for skilled ST to address.   Speech Therapy Frequency 2x / week   Treatment/Interventions Compensatory strategies;Patient/family education;Functional tasks;Cognitive reorganization;Internal/external aids;SLP instruction and feedback   Potential to Achieve Goals Good   Potential Considerations Ability to learn/carryover information;Severity of impairments        Problem List Patient Active Problem List   Diagnosis Date Noted  . Essential hypertension 01/23/2015  . Hyperlipidemia 01/23/2015  . OSA (obstructive sleep apnea) 01/23/2015  . HLD (hyperlipidemia)   . CVA (cerebral infarction) 11/23/2014  . Stroke ([place]) 11/23/2014  . Hypertension 11/23/2014    [facility] , [place], [place]  02/27/2015, 2:25 PM  [facility] [address] [place], [place], [address] Phone: [phone]   Fax:  [phone]   Name: Phillip Curry MRN: [id number] Date of Birth: [birth year]

## 2015-02-27 NOTE — Patient Instructions (Signed)
Please bring your 3-ring binder next time with the papers hole punched and inside it

## 2015-02-27 NOTE — Therapy (Signed)
[name] [address] [place], [place], [address] Phone: [phone]   Fax:  [phone]  Occupational Therapy Treatment  Patient Details  Name: Phillip Curry MRN: [id number] Date of Birth: [birth year] No Data Recorded  Encounter Date: 02/27/2015      OT End of Session - 02/27/15 0939    Visit Number 25   Number of Visits 33   Date for OT Re-Evaluation 04/01/15   Authorization Type [employer] 120 visits combined, no auth   Authorization Time Period renewal completed 01/31/15, wk 3/8   Authorization - Number of Visits 40   OT Start Time [phone]   OT Stop Time 1015   OT Time Calculation (min) 38 min   Activity Tolerance Patient tolerated treatment well   Behavior During Therapy [facility] for tasks assessed/performed      Past Medical History  Diagnosis Date  . Hypertension   . Stroke [facility])     Past Surgical History  Procedure Laterality Date  . No past surgeries      There were no vitals filed for this visit.  Visit Diagnosis:  Hemiplegia affecting right dominant side ([place])  Lack of coordination due to stroke  Pain in joint of right shoulder      Subjective Assessment - 02/27/15 0938    Subjective  Pain is about the same, but pt reports taping helped   Patient is accompained by: Family member   Pertinent History see Epic snapshot   Patient Stated Goals to get better   Currently in Pain? Yes   Pain Score 3    Pain Location Shoulder   Pain Orientation Right   Pain Descriptors / Indicators Tightness;Sore   Pain Frequency Intermittent   Aggravating Factors  positioning   Pain Relieving Factors proper positioning, taping                      OT Treatments/Exercises (OP) - 02/27/15 0001    Neurological Re-education Exercises   Shoulder Flexion AAROM;Strengthening;Both;Supine;Seated  with PVC frame   Shoulder ABduction AAROM;Seated  with cane with mod facilitation/cues   Elbow Extension  AAROM;Both;Supine  with PVC frame for chest press   Other Exercises 1 AROM open chain shoulder flex with min facilitation and cues for proper positioning/avoiding compensation   Other Grasp and Release Exercises  Low-range functional reaching to grasp/release cylinder objects with min cues for normal movement patterns   Seated with weight on hand with shoulders in ER and scapular retraction for stretch   Manual Therapy   Manual Therapy Taping   Manual therapy comments To relax middle deltoid (pain site), facilitate scapula retraction and trunk extension, and correction piece for ER at [facility] [place] joint   Kinesiotex Inhibit Muscle;Facilitate Muscle                OT Education - 02/27/15 1226    Education Details proper positioning of RUE during RUE functional use/reach   Person(s) Educated Patient;Spouse   Methods Explanation;Verbal cues;Tactile cues;Demonstration   Comprehension Verbalized understanding;Returned demonstration          OT Short Term Goals - 02/27/15 0955    OT SHORT TERM GOAL #1   Title I with HEP   Baseline 01/07/15   Time 4   Period Weeks   Status Achieved   OT SHORT TERM GOAL #2   Title Pt will perform bathing with min A   Time 4   Period Weeks   Status Achieved   OT  SHORT TERM GOAL #3   Title Pt will perform dressing with supervision/ setup   Time 4   Period Weeks   Status Achieved  except tying shoes (pt leaves tied. O.T. shown alternative options/AE)   OT SHORT TERM GOAL #4   Title Pt will use RUE as a stabilizer with minA   Time 4   Period Weeks   Status Achieved  01/31/15   OT SHORT TERM GOAL #5   Title Pt will demonstrate 50% A/ROM elbow flexion/ extension.   Time 4   Period Weeks   Status Achieved   OT SHORT TERM GOAL #6   Title Pt will be modified independent with RUE positioning to minimize pain, risk for injury including splint wear PRN.   Time 4   Period Weeks   Status Achieved   OT SHORT TERM GOAL #7   Title Pt will use RUE as a  stabilizer/ gross A for ADLs at least 50% of the time.--check updated STGs 03/02/15   Baseline new for renewal 01/31/15   Time 4   Period Weeks   Status On-going  02/27/15:  20-25%   OT SHORT TERM GOAL #8   Title Pt will demo at least 100* R shoulder flex with minimal compensation for functional reaching.   Baseline new for renewal 01/31/15   Time 4   Period Weeks   Status On-going  02/27/15:  95*   OT SHORT TERM GOAL  #9   TITLE Pt will improve RUE coordination/functional reaching for ADLs as shown by improving score on box and blocks test by at least 5 with RUE.   Baseline new for renewal 01/31/15- 6 blocks    Time 4   Period Weeks   Status New           OT Long Term Goals - 01/31/15 1025    OT LONG TERM GOAL #1   Title Pt will use RUE as a stabilizer/ gross A for ADLs at least 25% of the time.   Baseline due 02/05/15--will update as STG for renewal period   Time 8   Period Weeks   Status Not Met  01/31/15  approx 10% per pt report   OT LONG TERM GOAL #2   Title Pt will demonstrate 30* shoulder flexion/ scaption in prep for functional reach.   Time 8   Period Weeks   Status Achieved  01/31/15 90* with minimal compensation   OT LONG TERM GOAL #3   Title Pt will perform all basic ADLs with distant supervision.   Time 8   Period Weeks   Status Achieved  01/31/15 met per pt except tying shoes (has been instructed in AE for this)   OT LONG TERM GOAL #4   Title Pt will perfom simple snack prep/ light home management with supervision.   Time 8   Period Weeks   Status Achieved  01/31/15 cold snack prep   OT LONG TERM GOAL #5   Title Pt will use RUE as a stabilizer/ gross A for ADLs at least 75% of the time.--check updated LTGs 04/01/15   Time 8   Period Weeks   Status New   Long Term Additional Goals   Additional Long Term Goals Yes   OT LONG TERM GOAL #6   Title Pt will demo at least 110* R shoulder flex with minimal compensation for functional reaching.   Time 8    Period Weeks   Status New   OT LONG TERM GOAL #7  Title Pt will improve RUE coordination/functional reaching for ADLs as shown by improving score on box and blocks test by at least 10 with RUE.   Baseline 6 blocks 01/31/15   Time 8   Period Weeks   Status New               Plan - 02/27/15 0954    Clinical Impression Statement Pt report kinesiotape helped R shoulder pain and no problems with full composite extension splint wear.  Pt demo decr (no pain) with proper positioning.  Pt also demo improved finger ext for functional grasp with less spasticity noted now that he is wearing full composite ext splint   Plan check remaining STGs, neuro re-ed   OT Home Exercise Plan education issued:  12/12/14 Initial HEP and bed positioning handout, A/E recommendations; 01/31/15 updated RUE functional use HEP, full composite ext splint issued 11/2   Consulted and Agree with Plan of Care Patient   Family Member Consulted wife [name]        Problem List Patient Active Problem List   Diagnosis Date Noted  . Essential hypertension 01/23/2015  . Hyperlipidemia 01/23/2015  . OSA (obstructive sleep apnea) 01/23/2015  . HLD (hyperlipidemia)   . CVA (cerebral infarction) 11/23/2014  . Stroke ([place]) 11/23/2014  . Hypertension 11/23/2014    [facility] 02/27/2015, 12:33 PM  [facility] [address] [place], [place], [address] Phone: [phone]   Fax:  [phone]  Name: Phillip Curry MRN: [id number] Date of Birth: 1955/05/14  Vianne Bulls, OTR/L 02/27/2015 12:33 PM

## 2015-02-28 ENCOUNTER — Encounter: Payer: BLUE CROSS/BLUE SHIELD | Admitting: Speech Pathology

## 2015-03-02 ENCOUNTER — Encounter: Payer: Self-pay | Admitting: Occupational Therapy

## 2015-03-02 ENCOUNTER — Ambulatory Visit: Payer: BLUE CROSS/BLUE SHIELD | Admitting: Occupational Therapy

## 2015-03-02 DIAGNOSIS — M25511 Pain in right shoulder: Secondary | ICD-10-CM | POA: Diagnosis not present

## 2015-03-02 DIAGNOSIS — IMO0002 Reserved for concepts with insufficient information to code with codable children: Secondary | ICD-10-CM

## 2015-03-02 DIAGNOSIS — G8191 Hemiplegia, unspecified affecting right dominant side: Secondary | ICD-10-CM

## 2015-03-02 NOTE — Therapy (Signed)
[facility] [address] [place], [place], [address] Phone: [phone]   Fax:  [phone]  Occupational Therapy Treatment  Patient Details  Name: Phillip Curry MRN: [id number] Date of Birth: Nov 12, 1955 No Data Recorded  Encounter Date: 03/02/2015      OT End of Session - 03/02/15 1622    Visit Number 26   Number of Visits 33   Date for OT Re-Evaluation 04/01/15   Authorization Type [employer] 120 visits combined, no auth   Authorization Time Period renewal completed 01/31/15, wk 4/8   Authorization - Number of Visits 40   OT Start Time 1532   OT Stop Time 1617   OT Time Calculation (min) 45 min   Activity Tolerance Patient tolerated treatment well      Past Medical History  Diagnosis Date  . Hypertension   . Stroke [facility])     Past Surgical History  Procedure Laterality Date  . No past surgeries      There were no vitals filed for this visit.  Visit Diagnosis:  Hemiplegia affecting right dominant side ([place])  Lack of coordination due to stroke  Pain in joint of right shoulder      Subjective Assessment - 03/02/15 1551    Subjective  Not having pain today - so far, so good. The taping does help   Pertinent History see Epic snapshot   Patient Stated Goals to get better   Currently in Pain? No/denies                      OT Treatments/Exercises (OP) - 03/02/15 0001    ADLs   Eating Pt encouraged to eat all finger foods with Rt hand. Pt also encouraged to use built up eating utensil (provided previously) to eat food that won't easily slip off fork or spoon with Rt hand   Writing Practiced writing name in print with built up pen Rt hand with approx. 60-70% legibility. Issued tan foam today    Exercises   Exercises Shoulder   Shoulder Exercises: ROM/Strengthening   UBE (Upper Arm Bike) UBE x 8 min. level 2 with Rt hand wrapped (pt had initial pain but resolved quickly). Pt did 4 min.  forward, then 4 min. backwards   Fine Motor Coordination   Fine Motor Coordination Flipping cards   Flipping cards using large jumbo size with emphasis on finger extension and supination   Neurological Re-education Exercises   Other Exercises 1 BUE AA/ROM in shoulder flexion with 1 lb. weight (seated to above eye level) with focus on proper alignment   Functional Reaching Activities   Low Level Placing larger pegs in semicircular pegboard and removing with mod difficulty placing. Pt unable to use shorter large pegs.                   OT Short Term Goals - 03/02/15 1622    OT SHORT TERM GOAL #1   Title I with HEP   Baseline 01/07/15   Time 4   Period Weeks   Status Achieved   OT SHORT TERM GOAL #2   Title Pt will perform bathing with min A   Time 4   Period Weeks   Status Achieved   OT SHORT TERM GOAL #3   Title Pt will perform dressing with supervision/ setup   Time 4   Period Weeks   Status Achieved  except tying shoes (pt leaves tied. O.T. shown alternative options/AE)  OT SHORT TERM GOAL #4   Title Pt will use RUE as a stabilizer with minA   Time 4   Period Weeks   Status Achieved  01/31/15   OT SHORT TERM GOAL #5   Title Pt will demonstrate 50% A/ROM elbow flexion/ extension.   Time 4   Period Weeks   Status Achieved   OT SHORT TERM GOAL #6   Title Pt will be modified independent with RUE positioning to minimize pain, risk for injury including splint wear PRN.   Time 4   Period Weeks   Status Achieved   OT SHORT TERM GOAL #7   Title Pt will use RUE as a stabilizer/ gross A for ADLs at least 50% of the time.--check updated STGs 03/02/15   Baseline new for renewal 01/31/15   Time 4   Period Weeks   Status On-going  02/27/15:  20-25%   OT SHORT TERM GOAL #8   Title Pt will demo at least 100* R shoulder flex with minimal compensation for functional reaching.   Baseline new for renewal 01/31/15   Time 4   Period Weeks   Status On-going  02/27/15:  95*    OT SHORT TERM GOAL  #9   TITLE Pt will improve RUE coordination/functional reaching for ADLs as shown by improving score on box and blocks test by at least 5 with RUE.   Baseline new for renewal 01/31/15- 6 blocks    Time 4   Period Weeks   Status Achieved  03/02/15: Rt = 11           OT Long Term Goals - 01/31/15 1025    OT LONG TERM GOAL #1   Title Pt will use RUE as a stabilizer/ gross A for ADLs at least 25% of the time.   Baseline due 02/05/15--will update as STG for renewal period   Time 8   Period Weeks   Status Not Met  01/31/15  approx 10% per pt report   OT LONG TERM GOAL #2   Title Pt will demonstrate 30* shoulder flexion/ scaption in prep for functional reach.   Time 8   Period Weeks   Status Achieved  01/31/15 90* with minimal compensation   OT LONG TERM GOAL #3   Title Pt will perform all basic ADLs with distant supervision.   Time 8   Period Weeks   Status Achieved  01/31/15 met per pt except tying shoes (has been instructed in AE for this)   OT LONG TERM GOAL #4   Title Pt will perfom simple snack prep/ light home management with supervision.   Time 8   Period Weeks   Status Achieved  01/31/15 cold snack prep   OT LONG TERM GOAL #5   Title Pt will use RUE as a stabilizer/ gross A for ADLs at least 75% of the time.--check updated LTGs 04/01/15   Time 8   Period Weeks   Status New   Long Term Additional Goals   Additional Long Term Goals Yes   OT LONG TERM GOAL #6   Title Pt will demo at least 110* R shoulder flex with minimal compensation for functional reaching.   Time 8   Period Weeks   Status New   OT LONG TERM GOAL #7   Title Pt will improve RUE coordination/functional reaching for ADLs as shown by improving score on box and blocks test by at least 10 with RUE.   Baseline 6 blocks 01/31/15  Time 8   Period Weeks   Status New               Plan - 03/02/15 1624    Clinical Impression Statement Pt met STG #9 from renewal period.  Pt with decreased pain (no pain) during session today.    Plan If no pain, consider m-CIMT activities for RUE - wife will be here to train. Assess how eating with Rt hand is going   OT Home Exercise Plan education issued:  12/12/14 Initial HEP and bed positioning handout, A/E recommendations; 01/31/15 updated RUE functional use HEP, full composite ext splint issued 11/2   Consulted and Agree with Plan of Care Patient        Problem List Patient Active Problem List   Diagnosis Date Noted  . Essential hypertension 01/23/2015  . Hyperlipidemia 01/23/2015  . OSA (obstructive sleep apnea) 01/23/2015  . HLD (hyperlipidemia)   . CVA (cerebral infarction) 11/23/2014  . Stroke ([place]) 11/23/2014  . Hypertension 11/23/2014    Carey Bullocks, OTR/L 03/02/2015, 4:29 PM  [facility] [address] [place], [place], [address] Phone: [phone]   Fax:  [phone]  Name: Phillip Curry MRN: [id number] Date of Birth: [birth year]

## 2015-03-03 ENCOUNTER — Ambulatory Visit: Payer: BLUE CROSS/BLUE SHIELD

## 2015-03-03 DIAGNOSIS — M25511 Pain in right shoulder: Secondary | ICD-10-CM | POA: Diagnosis not present

## 2015-03-03 DIAGNOSIS — R41841 Cognitive communication deficit: Secondary | ICD-10-CM

## 2015-03-03 NOTE — Therapy (Signed)
[facility] [address] [place], [place], [address] Phone: [phone]   Fax:  [phone]  Speech Language Pathology Treatment  Patient Details  Name: Phillip Curry MRN: [id number] Date of Birth: 07-02-1955 No Data Recorded  Encounter Date: 03/03/2015      End of Session - 03/03/15 1710    Visit Number 24   Number of Visits 35   Date for SLP Re-Evaluation 03/30/15   SLP Start Time 1155   SLP Stop Time  1240   SLP Time Calculation (min) 45 min   Activity Tolerance Patient tolerated treatment well      Past Medical History  Diagnosis Date  . Hypertension   . Stroke [facility])     Past Surgical History  Procedure Laterality Date  . No past surgeries      There were no vitals filed for this visit.  Visit Diagnosis: Cognitive communication deficit      Subjective Assessment - 03/03/15 1208    Subjective "He reminds me of an Alzheimer's patient, the way he blanks out sometimes." (wife)               ADULT SLP TREATMENT - 03/03/15 1209    General Information   Behavior/Cognition Pleasant mood;Cooperative;Requires cueing;Distractible   Treatment Provided   Treatment provided Cognitive-Linquistic   Pain Assessment   Pain Assessment No/denies pain   Cognitive-Linquistic Treatment   Treatment focused on Cognition   Skilled Treatment Pt arrived today with 3-ring binder. SLP assisted pt (mod A usually) to organize his 3-ring binder. Pt thought he was only organizing ST papers. Pt will organize OT papers at home for homework as well.  In alternating attnetion tasks, pt req'd mod A usually.   Assessment / Recommendations / Plan   Plan Continue with current plan of care   Progression Toward Goals   Progression toward goals Progressing toward goals            SLP Short Term Goals - 03/03/15 1712    SLP SHORT TERM GOAL #1   Title Pt will perform dysarthria HEP with rare minimal assistance   Time 1    Period Weeks   Status Achieved   SLP SHORT TERM GOAL #2   Title Pt will solve simple time, money, reasoning problems with 85% accuracy and occasional min A   Time 1   Period Weeks   Status Not Met   SLP SHORT TERM GOAL #3   Title Pt will manage meds at home with compensations with rare min A over 3 sessions as reported by spouse/pt.   Time 1   Period Weeks   Status Achieved          SLP Long Term Goals - 03/03/15 1712    SLP LONG TERM GOAL #1   Title Pt will be 90% intelligible in moderately complex conversation over 12 minutes   Time 1   Period Weeks   Status Achieved   SLP LONG TERM GOAL #2   Title Pt will solve simple time, money reasoning problems with 80% accuracy and occasional min A   Time 6   Period Weeks   Status On-going   SLP LONG TERM GOAL #3   Title Pt will utilize compensations for schedule, daily chores, lists 2x during therapy session over 2 sessions with rare min A   Time 1   Period Weeks   Status Achieved   SLP LONG TERM GOAL #4   Title pt will demo emergent  awareness in cognitive linguistic tasks with mod A occasionally   Time 6   Period Weeks   Status On-going   SLP LONG TERM GOAL #5   Title Pt will solve simple reasoning and organization cogntive linguistic problems with 80% accuracy and occasinal min A   Time 6   Period Weeks   Status On-going          Plan - 03/03/15 1711    Clinical Impression Statement Assistance cont to be needed with alternating attention as well as organization/problem solving. Maximizing his cognitive deficits as well as language output is still necessary for skilled ST to address.   Speech Therapy Frequency 2x / week   Duration --  8 weeks or 16 more visits   Treatment/Interventions Compensatory strategies;Patient/family education;Functional tasks;Cognitive reorganization;Internal/external aids;SLP instruction and feedback   Potential to Achieve Goals Good   Potential Considerations Ability to learn/carryover  information;Severity of impairments        Problem List Patient Active Problem List   Diagnosis Date Noted  . Essential hypertension 01/23/2015  . Hyperlipidemia 01/23/2015  . OSA (obstructive sleep apnea) 01/23/2015  . HLD (hyperlipidemia)   . CVA (cerebral infarction) 11/23/2014  . Stroke ([place]) 11/23/2014  . Hypertension 11/23/2014    [facility] , [place], [place]  03/03/2015, 5:12 PM  [facility] [address] [place], [place], [address] Phone: [phone]   Fax:  [phone]   Name: Phillip Curry MRN: [id number] Date of Birth: 08/20/1955

## 2015-03-03 NOTE — Patient Instructions (Signed)
  Please complete the assigned speech therapy homework and return it to your next session.  

## 2015-03-06 ENCOUNTER — Ambulatory Visit: Payer: BLUE CROSS/BLUE SHIELD | Admitting: Occupational Therapy

## 2015-03-08 ENCOUNTER — Encounter: Payer: BLUE CROSS/BLUE SHIELD | Admitting: Occupational Therapy

## 2015-03-09 ENCOUNTER — Encounter: Payer: Self-pay | Admitting: Occupational Therapy

## 2015-03-09 ENCOUNTER — Encounter: Payer: BLUE CROSS/BLUE SHIELD | Admitting: Occupational Therapy

## 2015-03-09 ENCOUNTER — Ambulatory Visit: Payer: BLUE CROSS/BLUE SHIELD | Admitting: Speech Pathology

## 2015-03-09 ENCOUNTER — Ambulatory Visit: Payer: BLUE CROSS/BLUE SHIELD | Admitting: Occupational Therapy

## 2015-03-09 DIAGNOSIS — G8191 Hemiplegia, unspecified affecting right dominant side: Secondary | ICD-10-CM

## 2015-03-09 DIAGNOSIS — R41841 Cognitive communication deficit: Secondary | ICD-10-CM

## 2015-03-09 DIAGNOSIS — IMO0002 Reserved for concepts with insufficient information to code with codable children: Secondary | ICD-10-CM

## 2015-03-09 DIAGNOSIS — M25511 Pain in right shoulder: Secondary | ICD-10-CM | POA: Diagnosis not present

## 2015-03-09 NOTE — Therapy (Signed)
[facility] [address] [place], [place], [address] Phone: [phone]   Fax:  [phone]  Occupational Therapy Treatment  Patient Details  Name: Phillip Curry MRN: [id number] Date of Birth: 13-Feb-1956 No Data Recorded  Encounter Date: 03/09/2015      OT End of Session - 03/09/15 1159    Visit Number 27   Number of Visits 33   Date for OT Re-Evaluation 04/01/15   Authorization Type [employer] 120 visits combined, no auth   Authorization Time Period renewal completed 01/31/15, wk 5/8   OT Start Time 1105   OT Stop Time 1145   OT Time Calculation (min) 40 min   Activity Tolerance Patient tolerated treatment well   Behavior During Therapy [facility] for tasks assessed/performed      Past Medical History  Diagnosis Date  . Hypertension   . Stroke [facility])     Past Surgical History  Procedure Laterality Date  . No past surgeries      There were no vitals filed for this visit.  Visit Diagnosis:  Hemiplegia affecting right dominant side ([place])  Lack of coordination due to stroke  Weakness due to cerebrovascular accident      Subjective Assessment - 03/09/15 1106    Subjective  I've been using my Rt hand to eat but my Lt hand has to help (hand over hand technique)   Patient is accompained by: Family member   Pertinent History see Epic snapshot   Patient Stated Goals to get better   Currently in Pain? No/denies  not today            [facility] OT Assessment - 03/09/15 0001    Coordination   Box and Blocks 16                  OT Treatments/Exercises (OP) - 03/09/15 0001    ADLs   ADL Comments Pt/wife issued m-CIMT activities to perform at home and reviewed safety considerations at practiced several activities in clinic. Pt encouraged to perform 30 minutes 2x/day with oven mitt on Lt hand. Pt also reminded of positioning to prevent sh. pain. If pain occurs with activities, stop and reposition. If pain  continues - discontinue. Pt/wife agree. Pt/wife also encouraged to trace letters/shapes and provided handouts (to make copies at home)                OT Education - 03/09/15 1157    Education provided Yes   Education Details m-CIMT HEP   Person(s) Educated Patient;Spouse   Methods Explanation;Demonstration;Handout   Comprehension Verbalized understanding;Returned demonstration          OT Short Term Goals - 03/02/15 1622    OT SHORT TERM GOAL #1   Title I with HEP   Baseline 01/07/15   Time 4   Period Weeks   Status Achieved   OT SHORT TERM GOAL #2   Title Pt will perform bathing with min A   Time 4   Period Weeks   Status Achieved   OT SHORT TERM GOAL #3   Title Pt will perform dressing with supervision/ setup   Time 4   Period Weeks   Status Achieved  except tying shoes (pt leaves tied. O.T. shown alternative options/AE)   OT SHORT TERM GOAL #4   Title Pt will use RUE as a stabilizer with minA   Time 4   Period Weeks   Status Achieved  01/31/15   OT SHORT TERM GOAL #5  Title Pt will demonstrate 50% A/ROM elbow flexion/ extension.   Time 4   Period Weeks   Status Achieved   OT SHORT TERM GOAL #6   Title Pt will be modified independent with RUE positioning to minimize pain, risk for injury including splint wear PRN.   Time 4   Period Weeks   Status Achieved   OT SHORT TERM GOAL #7   Title Pt will use RUE as a stabilizer/ gross A for ADLs at least 50% of the time.--check updated STGs 03/02/15   Baseline new for renewal 01/31/15   Time 4   Period Weeks   Status On-going  02/27/15:  20-25%   OT SHORT TERM GOAL #8   Title Pt will demo at least 100* R shoulder flex with minimal compensation for functional reaching.   Baseline new for renewal 01/31/15   Time 4   Period Weeks   Status On-going  02/27/15:  95*   OT SHORT TERM GOAL  #9   TITLE Pt will improve RUE coordination/functional reaching for ADLs as shown by improving score on box and blocks test  by at least 5 with RUE.   Baseline new for renewal 01/31/15- 6 blocks    Time 4   Period Weeks   Status Achieved  03/02/15: Rt = 11           OT Long Term Goals - 01/31/15 1025    OT LONG TERM GOAL #1   Title Pt will use RUE as a stabilizer/ gross A for ADLs at least 25% of the time.   Baseline due 02/05/15--will update as STG for renewal period   Time 8   Period Weeks   Status Not Met  01/31/15  approx 10% per pt report   OT LONG TERM GOAL #2   Title Pt will demonstrate 30* shoulder flexion/ scaption in prep for functional reach.   Time 8   Period Weeks   Status Achieved  01/31/15 90* with minimal compensation   OT LONG TERM GOAL #3   Title Pt will perform all basic ADLs with distant supervision.   Time 8   Period Weeks   Status Achieved  01/31/15 met per pt except tying shoes (has been instructed in AE for this)   OT LONG TERM GOAL #4   Title Pt will perfom simple snack prep/ light home management with supervision.   Time 8   Period Weeks   Status Achieved  01/31/15 cold snack prep   OT LONG TERM GOAL #5   Title Pt will use RUE as a stabilizer/ gross A for ADLs at least 75% of the time.--check updated LTGs 04/01/15   Time 8   Period Weeks   Status New   Long Term Additional Goals   Additional Long Term Goals Yes   OT LONG TERM GOAL #6   Title Pt will demo at least 110* R shoulder flex with minimal compensation for functional reaching.   Time 8   Period Weeks   Status New   OT LONG TERM GOAL #7   Title Pt will improve RUE coordination/functional reaching for ADLs as shown by improving score on box and blocks test by at least 10 with RUE.   Baseline 6 blocks 01/31/15   Time 8   Period Weeks   Status New               Plan - 03/09/15 1200    Clinical Impression Statement Pt issued m-CIMT activities today with  modifications and safety considerations. Pt with no pain today, but has minimal shoulder pain at times.    Plan continue NMR RUE, assess how  m-CIMT activities are going at home   [place] education issued:  12/12/14 Initial HEP and bed positioning handout, A/E recommendations; 01/31/15 updated RUE functional use HEP, full composite ext splint issued 11/2   Consulted and Agree with Plan of Care Patient;Family member/caregiver   Family Member Consulted wife [name]        Problem List Patient Active Problem List   Diagnosis Date Noted  . Essential hypertension 01/23/2015  . Hyperlipidemia 01/23/2015  . OSA (obstructive sleep apnea) 01/23/2015  . HLD (hyperlipidemia)   . CVA (cerebral infarction) 11/23/2014  . Stroke ([place]) 11/23/2014  . Hypertension 11/23/2014    Carey Bullocks, OTR/L 03/09/2015, 12:02 PM  [facility] [address] [place], [place], [address] Phone: [phone]   Fax:  [phone]  Name: Phillip Curry MRN: [id number] Date of Birth: May 17, 1955

## 2015-03-09 NOTE — Therapy (Signed)
[facility] [address] [place], [place], [address] Phone: [phone]   Fax:  [phone]  Speech Language Pathology Treatment  Patient Details  Name: Phillip Curry MRN: [id number] Date of Birth: 1956-03-30 No Data Recorded  Encounter Date: 03/09/2015      End of Session - 03/09/15 1508    Visit Number 25   Number of Visits 35   Date for SLP Re-Evaluation 03/30/15   SLP Start Time 1233   SLP Stop Time  1316   SLP Time Calculation (min) 43 min      Past Medical History  Diagnosis Date  . Hypertension   . Stroke [facility])     Past Surgical History  Procedure Laterality Date  . No past surgeries      There were no vitals filed for this visit.  Visit Diagnosis: Cognitive communication deficit      Subjective Assessment - 03/09/15 1237    Subjective "I just have to keep trying and doing - I'm a little burned out but I have to keep going"   Patient is accompained by: Family member   Currently in Pain? No/denies               ADULT SLP TREATMENT - 03/09/15 1237    General Information   Behavior/Cognition Pleasant mood;Cooperative;Requires cueing;Distractible   Treatment Provided   Treatment provided Cognitive-Linquistic   Pain Assessment   Pain Assessment No/denies pain   Cognitive-Linquistic Treatment   Treatment focused on Cognition   Skilled Treatment Facilitated organization and mildly complex math via reading recipe converionn chart, followoing directions/word problems to read chart and convert measurements. Pt required usual mod A to organize problems with diagrams and breaking down problems into smaller steps. Facilitated alternating attention between naming  8 items in a given category and simple conversation. Pt required occasional  min A to generate 8 items for category   Assessment / Recommendations / Plan   Plan Continue with current plan of care   Progression Toward Goals   Progression toward goals Progressing toward goals            SLP Short Term Goals - 03/09/15 1507    SLP SHORT TERM GOAL #1   Title Pt will perform dysarthria HEP with rare minimal assistance   Time 1   Period Weeks   Status Achieved   SLP SHORT TERM GOAL #2   Title Pt will solve simple time, money, reasoning problems with 85% accuracy and occasional min A   Time 1   Period Weeks   Status Not Met   SLP SHORT TERM GOAL #3   Title Pt will manage meds at home with compensations with rare min A over 3 sessions as reported by spouse/pt.   Time 1   Period Weeks   Status Achieved          SLP Long Term Goals - 03/09/15 1507    SLP LONG TERM GOAL #1   Title Pt will be 90% intelligible in moderately complex conversation over 12 minutes   Time 1   Period Weeks   Status Achieved   SLP LONG TERM GOAL #2   Title Pt will solve simple time, money reasoning problems with 80% accuracy and occasional min A   Time 6   Period Weeks   Status On-going   SLP LONG TERM GOAL #3   Title Pt will utilize compensations for schedule, daily chores, lists 2x during therapy session over 2 sessions with  rare min A   Time 1   Period Weeks   Status Achieved   SLP LONG TERM GOAL #4   Title pt will demo emergent awareness in cognitive linguistic tasks with mod A occasionally   Time 6   Period Weeks   Status On-going   SLP LONG TERM GOAL #5   Title Pt will solve simple reasoning and organization cogntive linguistic problems with 80% accuracy and occasinal min A   Time 6   Period Weeks   Status On-going          Plan - 03/09/15 1507    Clinical Impression Statement Assistance cont to be needed with alternating attention as well as organization/problem solving. Maximizing his cognitive deficits as well as language output is still necessary for skilled ST to address.   Speech Therapy Frequency 2x / week   Treatment/Interventions Compensatory strategies;Patient/family education;Functional  tasks;Cognitive reorganization;Internal/external aids;SLP instruction and feedback   Potential to Achieve Goals Good   Potential Considerations Ability to learn/carryover information;Severity of impairments   Consulted and Agree with Plan of Care Patient        Problem List Patient Active Problem List   Diagnosis Date Noted  . Essential hypertension 01/23/2015  . Hyperlipidemia 01/23/2015  . OSA (obstructive sleep apnea) 01/23/2015  . HLD (hyperlipidemia)   . CVA (cerebral infarction) 11/23/2014  . Stroke ([place]) 11/23/2014  . Hypertension 11/23/2014    Lovvorn, Annye Rusk MS, CCC-SLP 03/09/2015, 3:09 PM  [facility] [address] [place], [place], [address] Phone: [phone]   Fax:  [phone]   Name: Phillip Curry MRN: [id number] Date of Birth: November 13, 1955

## 2015-03-10 ENCOUNTER — Ambulatory Visit: Payer: BLUE CROSS/BLUE SHIELD

## 2015-03-10 ENCOUNTER — Ambulatory Visit: Payer: BLUE CROSS/BLUE SHIELD | Admitting: Occupational Therapy

## 2015-03-10 DIAGNOSIS — R41841 Cognitive communication deficit: Secondary | ICD-10-CM

## 2015-03-10 DIAGNOSIS — IMO0002 Reserved for concepts with insufficient information to code with codable children: Secondary | ICD-10-CM

## 2015-03-10 DIAGNOSIS — G8191 Hemiplegia, unspecified affecting right dominant side: Secondary | ICD-10-CM

## 2015-03-10 DIAGNOSIS — M25511 Pain in right shoulder: Secondary | ICD-10-CM | POA: Diagnosis not present

## 2015-03-10 NOTE — Patient Instructions (Signed)
  Please complete the assigned speech therapy homework and return it to your next session.  

## 2015-03-10 NOTE — Therapy (Signed)
[facility] [address] [place], [place], [address] Phone: [phone]   Fax:  [phone]  Speech Language Pathology Treatment  Patient Details  Name: Phillip Curry MRN: [id number] Date of Birth: May 27, 1955 No Data Recorded  Encounter Date: 03/10/2015      End of Session - 03/10/15 1112    Visit Number 26   Number of Visits 35   Date for SLP Re-Evaluation 03/30/15   SLP Start Time 1018   SLP Stop Time  1101   SLP Time Calculation (min) 43 min   Activity Tolerance Patient tolerated treatment well  pt appeared internally distracted      Past Medical History  Diagnosis Date  . Hypertension   . Stroke [facility])     Past Surgical History  Procedure Laterality Date  . No past surgeries      There were no vitals filed for this visit.  Visit Diagnosis: Cognitive communication deficit      Subjective Assessment - 03/10/15 1020    Patient is accompained by: Family member  wife   Currently in Pain? No/denies               ADULT SLP TREATMENT - 03/10/15 1024    General Information   Behavior/Cognition Pleasant mood;Cooperative;Requires cueing;Distractible   Treatment Provided   Treatment provided Cognitive-Linquistic   Cognitive-Linquistic Treatment   Treatment focused on Cognition   Skilled Treatment SLP targeted attention with pt today working on written categorization task requiring pt to alternate between word bank and category blank. With writing responses and conversation (simple Q and A) pt req'd extra time usually to return to writing. Organizational task targeted for functional cognitive-linguistic abilities - alphabetized 8 last names correctly. SLP made organization task more challenging and made members more abstract and pt had to provide category name (convergent naming) - this req'd SLP to use mon-max cues consistently with pt.   Assessment / Recommendations / Plan   Plan Continue with  current plan of care   Progression Toward Goals   Progression toward goals Progressing toward goals            SLP Short Term Goals - 03/09/15 1507    SLP SHORT TERM GOAL #1   Title Pt will perform dysarthria HEP with rare minimal assistance   Time 1   Period Weeks   Status Achieved   SLP SHORT TERM GOAL #2   Title Pt will solve simple time, money, reasoning problems with 85% accuracy and occasional min A   Time 1   Period Weeks   Status Not Met   SLP SHORT TERM GOAL #3   Title Pt will manage meds at home with compensations with rare min A over 3 sessions as reported by spouse/pt.   Time 1   Period Weeks   Status Achieved          SLP Long Term Goals - 03/10/15 1113    SLP LONG TERM GOAL #1   Title Pt will be 90% intelligible in moderately complex conversation over 12 minutes   Time 1   Period Weeks   Status Achieved   SLP LONG TERM GOAL #2   Title Pt will solve simple time, money reasoning problems with 80% accuracy and occasional min A   Time 6   Period Weeks   Status On-going   SLP LONG TERM GOAL #3   Title Pt will utilize compensations for schedule, daily chores, lists 2x during therapy session over 2  sessions with rare min A   Time 1   Period Weeks   Status Achieved   SLP LONG TERM GOAL #4   Title pt will demo emergent awareness in cognitive linguistic tasks with mod A occasionally   Time 6   Period Weeks   Status On-going   SLP LONG TERM GOAL #5   Title Pt will solve simple reasoning and organization cogntive linguistic problems with 80% accuracy and occasinal min A   Time 6   Period Weeks   Status On-going          Plan - 03/10/15 1112    Clinical Impression Statement Assistance cont to be needed with alternating attention as well as organization/problem solving. Maximizing his cognitive deficits as well as language output is still necessary for skilled ST to address.   Speech Therapy Frequency 2x / week   Duration --  8 weeks total or 9 more  visits   Treatment/Interventions Compensatory strategies;Patient/family education;Functional tasks;Cognitive reorganization;Internal/external aids;SLP instruction and feedback   Potential to Achieve Goals Good   Potential Considerations Ability to learn/carryover information;Severity of impairments        Problem List Patient Active Problem List   Diagnosis Date Noted  . Essential hypertension 01/23/2015  . Hyperlipidemia 01/23/2015  . OSA (obstructive sleep apnea) 01/23/2015  . HLD (hyperlipidemia)   . CVA (cerebral infarction) 11/23/2014  . Stroke ([place]) 11/23/2014  . Hypertension 11/23/2014    [facility] , [place], [place]  03/10/2015, 11:15 AM  [facility] [address] [place], [place], [address] Phone: [phone]   Fax:  [phone]   Name: Phillip Curry MRN: [id number] Date of Birth: [birth year]

## 2015-03-10 NOTE — Therapy (Signed)
[facility] [address] [place], [place], [address] Phone: [phone]   Fax:  [phone]  Occupational Therapy Treatment  Patient Details  Name: Phillip Curry MRN: [id number] Date of Birth: [birth year] No Data Recorded  Encounter Date: 03/10/2015      OT End of Session - 03/10/15 0936    Visit Number 28   Number of Visits 33   Date for OT Re-Evaluation 04/01/15   Authorization Type [employer] 120 visits combined, no auth   OT Start Time [phone]   OT Stop Time 1015   OT Time Calculation (min) 42 min   Activity Tolerance Patient tolerated treatment well   Behavior During Therapy [facility] for tasks assessed/performed      Past Medical History  Diagnosis Date  . Hypertension   . Stroke [facility])     Past Surgical History  Procedure Laterality Date  . No past surgeries      There were no vitals filed for this visit.  Visit Diagnosis:  Weakness due to cerebrovascular accident  Lack of coordination due to stroke  Hemiplegia affecting right dominant side [facility])      Subjective Assessment - 03/10/15 0933    Pertinent History see Epic snapshot   Patient Stated Goals to get better   Pain Location Shoulder   Pain Descriptors / Indicators Aching   Pain Onset 1 to 4 weeks ago   Pain Frequency Intermittent   Aggravating Factors  positioning   Pain Relieving Factors taping, proper positioning        Treatment: Functional activities with RUE: picking up checkers to place in connect four frame with RUE, flipping and sliding playing cards off deck, wiping tabletop, placing yellow clothespins on antennae , min v.c./ facilitation,  Min-mod difficulty  UE Ranger for mid level shoulder flexion AA/ROM 2 sest of 10 reps. Arm bike x 6 mins, for reciprocal  pt was able to perform without hand wrapped.                     OT Education - 03/09/15 1157    Education provided Yes   Education Details m-CIMT HEP   Person(s) Educated Patient;Spouse   Methods Explanation;Demonstration;Handout   Comprehension Verbalized understanding;Returned demonstration          OT Short Term Goals - 03/02/15 1622    OT SHORT TERM GOAL #1   Title I with HEP   Baseline 01/07/15   Time 4   Period Weeks   Status Achieved   OT SHORT TERM GOAL #2   Title Pt will perform bathing with min A   Time 4   Period Weeks   Status Achieved   OT SHORT TERM GOAL #3   Title Pt will perform dressing with supervision/ setup   Time 4   Period Weeks   Status Achieved  except tying shoes (pt leaves tied. O.T. shown alternative options/AE)   OT SHORT TERM GOAL #4   Title Pt will use RUE as a stabilizer with minA   Time 4   Period Weeks   Status Achieved  01/31/15   OT SHORT TERM GOAL #5   Title Pt will demonstrate 50% A/ROM elbow flexion/ extension.   Time 4   Period Weeks   Status Achieved   OT SHORT TERM GOAL #6   Title Pt will be modified independent with RUE positioning to minimize pain, risk for injury including splint wear PRN.   Time 4   Period Weeks  Status Achieved   OT SHORT TERM GOAL #7   Title Pt will use RUE as a stabilizer/ gross A for ADLs at least 50% of the time.--check updated STGs 03/02/15   Baseline new for renewal 01/31/15   Time 4   Period Weeks   Status On-going  02/27/15:  20-25%   OT SHORT TERM GOAL #8   Title Pt will demo at least 100* R shoulder flex with minimal compensation for functional reaching.   Baseline new for renewal 01/31/15   Time 4   Period Weeks   Status On-going  02/27/15:  95*   OT SHORT TERM GOAL  #9   TITLE Pt will improve RUE coordination/functional reaching for ADLs as shown by improving score on box and blocks test by at least 5 with RUE.   Baseline new for renewal 01/31/15- 6 blocks    Time 4   Period Weeks   Status Achieved  03/02/15: Rt = 11           OT Long Term Goals - 01/31/15 1025    OT LONG TERM GOAL #1   Title Pt will use RUE as a  stabilizer/ gross A for ADLs at least 25% of the time.   Baseline due 02/05/15--will update as STG for renewal period   Time 8   Period Weeks   Status Not Met  01/31/15  approx 10% per pt report   OT LONG TERM GOAL #2   Title Pt will demonstrate 30* shoulder flexion/ scaption in prep for functional reach.   Time 8   Period Weeks   Status Achieved  01/31/15 90* with minimal compensation   OT LONG TERM GOAL #3   Title Pt will perform all basic ADLs with distant supervision.   Time 8   Period Weeks   Status Achieved  01/31/15 met per pt except tying shoes (has been instructed in AE for this)   OT LONG TERM GOAL #4   Title Pt will perfom simple snack prep/ light home management with supervision.   Time 8   Period Weeks   Status Achieved  01/31/15 cold snack prep   OT LONG TERM GOAL #5   Title Pt will use RUE as a stabilizer/ gross A for ADLs at least 75% of the time.--check updated LTGs 04/01/15   Time 8   Period Weeks   Status New   Long Term Additional Goals   Additional Long Term Goals Yes   OT LONG TERM GOAL #6   Title Pt will demo at least 110* R shoulder flex with minimal compensation for functional reaching.   Time 8   Period Weeks   Status New   OT LONG TERM GOAL #7   Title Pt will improve RUE coordination/functional reaching for ADLs as shown by improving score on box and blocks test by at least 10 with RUE.   Baseline 6 blocks 01/31/15   Time 8   Period Weeks   Status New               Plan - 03/10/15 1305    Clinical Impression Statement Pt is progressing towards goals for RUE functional use.   Pt will benefit from skilled therapeutic intervention in order to improve on the following deficits (Retired) Abnormal gait;Decreased coordination;Decreased range of motion;Difficulty walking;Decreased endurance;Decreased activity tolerance;Decreased knowledge of precautions;Impaired tone;Pain;Impaired UE functional use;Decreased knowledge of use of DME;Decreased  cognition;Decreased mobility;Decreased strength;Impaired perceived functional ability   Rehab Potential Good   OT Frequency  2x / week   OT Duration 8 weeks   Plan NMR   OT Home Exercise Plan education issued:  12/12/14 Initial HEP and bed positioning handout, A/E recommendations; 01/31/15 updated RUE functional use HEP, full composite ext splint issued 11/2   Family Member Consulted wife [name]        Problem List Patient Active Problem List   Diagnosis Date Noted  . Essential hypertension 01/23/2015  . Hyperlipidemia 01/23/2015  . OSA (obstructive sleep apnea) 01/23/2015  . HLD (hyperlipidemia)   . CVA (cerebral infarction) 11/23/2014  . Stroke ([place]) 11/23/2014  . Hypertension 11/23/2014    [facility] 03/10/2015, 1:07 PM Theone Murdoch, OTR/L [phone] Phone: [phone] 1:07 PM 03/10/2015 [facility] [address] [place], [place], [address] Phone: [phone]   Fax:  [phone]  Name: Phillip Curry MRN: [id number] Date of Birth: June 26, 1955

## 2015-03-13 ENCOUNTER — Ambulatory Visit: Payer: BLUE CROSS/BLUE SHIELD | Admitting: Speech Pathology

## 2015-03-13 ENCOUNTER — Ambulatory Visit: Payer: BLUE CROSS/BLUE SHIELD | Admitting: Occupational Therapy

## 2015-03-13 DIAGNOSIS — M25511 Pain in right shoulder: Secondary | ICD-10-CM

## 2015-03-13 DIAGNOSIS — R41841 Cognitive communication deficit: Secondary | ICD-10-CM

## 2015-03-13 DIAGNOSIS — G8191 Hemiplegia, unspecified affecting right dominant side: Secondary | ICD-10-CM

## 2015-03-13 DIAGNOSIS — IMO0002 Reserved for concepts with insufficient information to code with codable children: Secondary | ICD-10-CM

## 2015-03-13 NOTE — Patient Instructions (Signed)
Generate charts from disorganized data and answer questions Mildy complex naming homework

## 2015-03-13 NOTE — Therapy (Signed)
[facility] [address] [place], [place], [address] Phone: [phone]   Fax:  [phone]  Speech Language Pathology Treatment  Patient Details  Name: Phillip Curry MRN: [id number] Date of Birth: 04/04/1956 No Data Recorded  Encounter Date: 03/13/2015      End of Session - 03/13/15 1158    Visit Number 27   Number of Visits 35   Date for SLP Re-Evaluation 03/30/15   SLP Start Time 1103   SLP Stop Time  1145   SLP Time Calculation (min) 42 min   Activity Tolerance Patient tolerated treatment well      Past Medical History  Diagnosis Date  . Hypertension   . Stroke [facility])     Past Surgical History  Procedure Laterality Date  . No past surgeries      There were no vitals filed for this visit.  Visit Diagnosis: Cognitive communication deficit      Subjective Assessment - 03/13/15 1107    Subjective "It took me a little while - I struggled a little bit" re: homeowork   Pain Score 3    Pain Location Shoulder   Pain Orientation Right   Pain Descriptors / Indicators Aching   Pain Type Chronic pain   Pain Onset More than a month ago   Pain Frequency Occasional   Aggravating Factors  positioning   Pain Relieving Factors change position   Multiple Pain Sites No               ADULT SLP TREATMENT - 03/13/15 1108    General Information   Behavior/Cognition Pleasant mood;Cooperative;Requires cueing;Distractible   Treatment Provided   Treatment provided Cognitive-Linquistic   Cognitive-Linquistic Treatment   Treatment focused on Cognition   Skilled Treatment Facilitated alternating attention and organization of information by generating chart, plotting data and adding up data with min A for organization of chart. Pt required usual mod A to ID math error on chart (attention to detail). He alternatated attention between chart and calulator with 85% accuracy and occasional min A.    Assessment /  Recommendations / Plan   Plan Continue with current plan of care   Progression Toward Goals   Progression toward goals Progressing toward goals            SLP Short Term Goals - 03/13/15 1156    SLP SHORT TERM GOAL #1   Title Pt will perform dysarthria HEP with rare minimal assistance   Time 1   Period Weeks   Status Achieved   SLP SHORT TERM GOAL #2   Title Pt will solve simple time, money, reasoning problems with 85% accuracy and occasional min A   Time 1   Period Weeks   Status Not Met   SLP SHORT TERM GOAL #3   Title Pt will manage meds at home with compensations with rare min A over 3 sessions as reported by spouse/pt.   Time 1   Period Weeks   Status Achieved          SLP Long Term Goals - 03/13/15 1156    SLP LONG TERM GOAL #1   Title Pt will be 90% intelligible in moderately complex conversation over 12 minutes   Time 1   Period Weeks   Status Achieved   SLP LONG TERM GOAL #2   Title Pt will solve simple time, money reasoning problems with 80% accuracy and occasional min A   Time 5   Period Weeks  Status On-going   SLP LONG TERM GOAL #3   Title Pt will utilize compensations for schedule, daily chores, lists 2x during therapy session over 2 sessions with rare min A   Time 1   Period Weeks   Status Achieved   SLP LONG TERM GOAL #4   Title pt will demo emergent awareness in cognitive linguistic tasks with mod A occasionally   Time 5   Period Weeks   Status On-going   SLP LONG TERM GOAL #5   Title Pt will solve simple reasoning and organization cogntive linguistic problems with 80% accuracy and occasinal min A   Time 5   Period Weeks   Status On-going          Plan - 03/13/15 1155    Clinical Impression Statement Assistance cont to be needed with alternating attention as well as organization/problem solving. Maximizing his cognitive deficits as well as language output is still necessary for skilled ST to address.   Speech Therapy Frequency 2x /  week   Treatment/Interventions Compensatory strategies;Patient/family education;Functional tasks;Cognitive reorganization;Internal/external aids;SLP instruction and feedback   Potential to Achieve Goals Good   Potential Considerations Ability to learn/carryover information;Severity of impairments   Consulted and Agree with Plan of Care Patient        Problem List Patient Active Problem List   Diagnosis Date Noted  . Essential hypertension 01/23/2015  . Hyperlipidemia 01/23/2015  . OSA (obstructive sleep apnea) 01/23/2015  . HLD (hyperlipidemia)   . CVA (cerebral infarction) 11/23/2014  . Stroke ([place]) 11/23/2014  . Hypertension 11/23/2014    [name], Annye Rusk MS, CCC-SLP 03/13/2015, 12:00 PM  [facility] [address] [place], [place], [address] Phone: [phone]   Fax:  [phone]   Name: Phillip Curry MRN: [id number] Date of Birth: [birth year]

## 2015-03-13 NOTE — Therapy (Signed)
[facility] [address] [place], [place], [address] Phone: [phone]   Fax:  [phone]  Occupational Therapy Treatment  Patient Details  Name: Phillip Curry MRN: [id number] Date of Birth: [birth year] No Data Recorded  Encounter Date: 03/13/2015      OT End of Session - 03/13/15 1058    Visit Number 29   Number of Visits 33   Date for OT Re-Evaluation 04/01/15   Authorization Type [employer] 120 visits combined, no auth   Authorization Time Period renewal completed 01/31/15, wk 6/8   Authorization - Number of Visits 40   OT Start Time 1020   OT Stop Time 1102   OT Time Calculation (min) 42 min   Activity Tolerance Patient tolerated treatment well   Behavior During Therapy [facility] for tasks assessed/performed      Past Medical History  Diagnosis Date  . Hypertension   . Stroke [facility])     Past Surgical History  Procedure Laterality Date  . No past surgeries      There were no vitals filed for this visit.  Visit Diagnosis:  Hemiplegia affecting right dominant side ([place])  Pain in joint of right shoulder  Lack of coordination due to stroke      Subjective Assessment - 03/13/15 1023    Subjective  "It gets stiff"   Patient is accompained by: Family member   Pertinent History see Epic snapshot   Patient Stated Goals to get better   Currently in Pain? No/denies  3/10 in am before stretching   Pain Score 3    Pain Location Shoulder   Pain Orientation Right   Pain Descriptors / Indicators Aching   Aggravating Factors  stretching end-range, positioning   Pain Relieving Factors continued stretching, repositioning                      OT Treatments/Exercises (OP) - 03/13/15 0001    Neurological Re-education Exercises   Scapular Stabilization Seated;Bilateral;Side-lying  retraction/depression with min cues/facilitation   Other Information In sidelying, AAROM shoulder flex with scapular  facilitation for incr moblity/stretching.     Shoulder Flexion Supine;AAROM;Self ROM  with min facilitation at scapula for incr mobility   Other Grasp and Release Exercises  Low-range functional reaching to remove cylinder pegs from pegboard and place in container with shoulder ER and then replacing in pegboard with intermittent min facilitation/min-mod cueing for normal movement patterns.        Arm bike x3mn level 1 for reciprocal movement without wrapping hand.  Pt lost grasp multiple times with brief pauses to reposition.           OT Education - 03/13/15 1214    Education Details proper positioning of RUE in bed   Person(s) Educated Patient;Spouse   Methods Explanation;Demonstration   Comprehension Verbalized understanding          OT Short Term Goals - 03/02/15 1622    OT SHORT TERM GOAL #1   Title I with HEP   Baseline 01/07/15   Time 4   Period Weeks   Status Achieved   OT SHORT TERM GOAL #2   Title Pt will perform bathing with min A   Time 4   Period Weeks   Status Achieved   OT SHORT TERM GOAL #3   Title Pt will perform dressing with supervision/ setup   Time 4   Period Weeks   Status Achieved  except tying shoes (pt leaves tied. O.T.  shown alternative options/AE)   OT SHORT TERM GOAL #4   Title Pt will use RUE as a stabilizer with minA   Time 4   Period Weeks   Status Achieved  01/31/15   OT SHORT TERM GOAL #5   Title Pt will demonstrate 50% A/ROM elbow flexion/ extension.   Time 4   Period Weeks   Status Achieved   OT SHORT TERM GOAL #6   Title Pt will be modified independent with RUE positioning to minimize pain, risk for injury including splint wear PRN.   Time 4   Period Weeks   Status Achieved   OT SHORT TERM GOAL #7   Title Pt will use RUE as a stabilizer/ gross A for ADLs at least 50% of the time.--check updated STGs 03/02/15   Baseline new for renewal 01/31/15   Time 4   Period Weeks   Status On-going  02/27/15:  20-25%   OT SHORT  TERM GOAL #8   Title Pt will demo at least 100* R shoulder flex with minimal compensation for functional reaching.   Baseline new for renewal 01/31/15   Time 4   Period Weeks   Status On-going  02/27/15:  95*   OT SHORT TERM GOAL  #9   TITLE Pt will improve RUE coordination/functional reaching for ADLs as shown by improving score on box and blocks test by at least 5 with RUE.   Baseline new for renewal 01/31/15- 6 blocks    Time 4   Period Weeks   Status Achieved  03/02/15: Rt = 11           OT Long Term Goals - 01/31/15 1025    OT LONG TERM GOAL #1   Title Pt will use RUE as a stabilizer/ gross A for ADLs at least 25% of the time.   Baseline due 02/05/15--will update as STG for renewal period   Time 8   Period Weeks   Status Not Met  01/31/15  approx 10% per pt report   OT LONG TERM GOAL #2   Title Pt will demonstrate 30* shoulder flexion/ scaption in prep for functional reach.   Time 8   Period Weeks   Status Achieved  01/31/15 90* with minimal compensation   OT LONG TERM GOAL #3   Title Pt will perform all basic ADLs with distant supervision.   Time 8   Period Weeks   Status Achieved  01/31/15 met per pt except tying shoes (has been instructed in AE for this)   OT LONG TERM GOAL #4   Title Pt will perfom simple snack prep/ light home management with supervision.   Time 8   Period Weeks   Status Achieved  01/31/15 cold snack prep   OT LONG TERM GOAL #5   Title Pt will use RUE as a stabilizer/ gross A for ADLs at least 75% of the time.--check updated LTGs 04/01/15   Time 8   Period Weeks   Status New   Long Term Additional Goals   Additional Long Term Goals Yes   OT LONG TERM GOAL #6   Title Pt will demo at least 110* R shoulder flex with minimal compensation for functional reaching.   Time 8   Period Weeks   Status New   OT LONG TERM GOAL #7   Title Pt will improve RUE coordination/functional reaching for ADLs as shown by improving score on box and blocks  test by at least 10 with RUE.  Baseline 6 blocks 01/31/15   Time 8   Period Weeks   Status New               Plan - 03/13/15 1059    Clinical Impression Statement Pt demo improving coordination with less compensation/spasticity during grasp.   Plan neuro re-ed, RUE functional use   Consulted and Agree with Plan of Care Patient;Family member/caregiver   Family Member Consulted wife [name]        Problem List Patient Active Problem List   Diagnosis Date Noted  . Essential hypertension 01/23/2015  . Hyperlipidemia 01/23/2015  . OSA (obstructive sleep apnea) 01/23/2015  . HLD (hyperlipidemia)   . CVA (cerebral infarction) 11/23/2014  . Stroke ([place]) 11/23/2014  . Hypertension 11/23/2014    [facility] 03/13/2015, 12:30 PM  [facility] [address] [place], [place], [address] Phone: [phone]   Fax:  [phone]  Name: Drelyn Pistilli MRN: [id number] Date of Birth: 21-Apr-1956  Vianne Bulls, OTR/L 03/13/2015 12:30 PM

## 2015-03-14 ENCOUNTER — Ambulatory Visit: Payer: BLUE CROSS/BLUE SHIELD | Admitting: Occupational Therapy

## 2015-03-14 DIAGNOSIS — M25511 Pain in right shoulder: Secondary | ICD-10-CM

## 2015-03-14 DIAGNOSIS — IMO0002 Reserved for concepts with insufficient information to code with codable children: Secondary | ICD-10-CM

## 2015-03-14 DIAGNOSIS — G8191 Hemiplegia, unspecified affecting right dominant side: Secondary | ICD-10-CM

## 2015-03-14 NOTE — Therapy (Signed)
[facility] [address] [place], [place], [address] Phone: [phone]   Fax:  [phone]  Occupational Therapy Treatment  Patient Details  Name: Phillip Curry MRN: [id number] Date of Birth: 08-05-1955 No Data Recorded  Encounter Date: 03/14/2015      OT End of Session - 03/14/15 1300    Visit Number 30   Number of Visits 33   Date for OT Re-Evaluation 04/01/15   Authorization Type [employer] 120 visits combined, no auth   Authorization Time Period renewal completed 01/31/15, wk 6/8   Authorization - Number of Visits 40   OT Start Time 1019   OT Stop Time 1100   OT Time Calculation (min) 41 min   Activity Tolerance Patient tolerated treatment well   Behavior During Therapy [facility] for tasks assessed/performed      Past Medical History  Diagnosis Date  . Hypertension   . Stroke [facility])     Past Surgical History  Procedure Laterality Date  . No past surgeries      There were no vitals filed for this visit.  Visit Diagnosis:  Hemiplegia affecting right dominant side ([place])  Lack of coordination due to stroke  Pain in joint of right shoulder      Subjective Assessment - 03/14/15 1228    Subjective  no pain during session   Patient is accompained by: Family member   Pertinent History see Epic snapshot   Patient Stated Goals to get better   Currently in Pain? No/denies                      OT Treatments/Exercises (OP) - 03/14/15 0001    ADLs   Writing Practiced writing name with built-up pen with 100% legibility and min cues given for avoiding trunk movement   Neurological Re-education Exercises   Shoulder Flexion AAROM;Both;Supine  w/PVC frame with scapular facilitation   Elbow Extension AAROM;Both;Supine  chest press with PVC frame with scapular retraction    Other Exercises 1 In sitting AAROM and open chain shoulder flex with min facilitation at scapula and occasionally elbow for  positioning and scapular mobility.   Other Grasp and Release Exercises  Low-mid range functional reaching to grasp/release cylinder objects in ER/abduction and shoulder flex with min cueing for normal movement patterns/decr compensation   Seated with weight on hand with LUE reaching and body on arm movements, min cues for avoiding looking at RUE     Placing medium pegs in pegboard with mod-max difficulty and min verbal/tactile cues for compensation.            OT Education - 03/13/15 1214    Education Details proper positioning of RUE in bed   Person(s) Educated Patient;Spouse   Methods Explanation;Demonstration   Comprehension Verbalized understanding          OT Short Term Goals - 03/02/15 1622    OT SHORT TERM GOAL #1   Title I with HEP   Baseline 01/07/15   Time 4   Period Weeks   Status Achieved   OT SHORT TERM GOAL #2   Title Pt will perform bathing with min A   Time 4   Period Weeks   Status Achieved   OT SHORT TERM GOAL #3   Title Pt will perform dressing with supervision/ setup   Time 4   Period Weeks   Status Achieved  except tying shoes (pt leaves tied. O.T. shown alternative options/AE)   OT SHORT TERM  GOAL #4   Title Pt will use RUE as a stabilizer with minA   Time 4   Period Weeks   Status Achieved  01/31/15   OT SHORT TERM GOAL #5   Title Pt will demonstrate 50% A/ROM elbow flexion/ extension.   Time 4   Period Weeks   Status Achieved   OT SHORT TERM GOAL #6   Title Pt will be modified independent with RUE positioning to minimize pain, risk for injury including splint wear PRN.   Time 4   Period Weeks   Status Achieved   OT SHORT TERM GOAL #7   Title Pt will use RUE as a stabilizer/ gross A for ADLs at least 50% of the time.--check updated STGs 03/02/15   Baseline new for renewal 01/31/15   Time 4   Period Weeks   Status On-going  02/27/15:  20-25%   OT SHORT TERM GOAL #8   Title Pt will demo at least 100* R shoulder flex with minimal  compensation for functional reaching.   Baseline new for renewal 01/31/15   Time 4   Period Weeks   Status On-going  02/27/15:  95*   OT SHORT TERM GOAL  #9   TITLE Pt will improve RUE coordination/functional reaching for ADLs as shown by improving score on box and blocks test by at least 5 with RUE.   Baseline new for renewal 01/31/15- 6 blocks    Time 4   Period Weeks   Status Achieved  03/02/15: Rt = 11           OT Long Term Goals - 01/31/15 1025    OT LONG TERM GOAL #1   Title Pt will use RUE as a stabilizer/ gross A for ADLs at least 25% of the time.   Baseline due 02/05/15--will update as STG for renewal period   Time 8   Period Weeks   Status Not Met  01/31/15  approx 10% per pt report   OT LONG TERM GOAL #2   Title Pt will demonstrate 30* shoulder flexion/ scaption in prep for functional reach.   Time 8   Period Weeks   Status Achieved  01/31/15 90* with minimal compensation   OT LONG TERM GOAL #3   Title Pt will perform all basic ADLs with distant supervision.   Time 8   Period Weeks   Status Achieved  01/31/15 met per pt except tying shoes (has been instructed in AE for this)   OT LONG TERM GOAL #4   Title Pt will perfom simple snack prep/ light home management with supervision.   Time 8   Period Weeks   Status Achieved  01/31/15 cold snack prep   OT LONG TERM GOAL #5   Title Pt will use RUE as a stabilizer/ gross A for ADLs at least 75% of the time.--check updated LTGs 04/01/15   Time 8   Period Weeks   Status New   Long Term Additional Goals   Additional Long Term Goals Yes   OT LONG TERM GOAL #6   Title Pt will demo at least 110* R shoulder flex with minimal compensation for functional reaching.   Time 8   Period Weeks   Status New   OT LONG TERM GOAL #7   Title Pt will improve RUE coordination/functional reaching for ADLs as shown by improving score on box and blocks test by at least 10 with RUE.   Baseline 6 blocks 01/31/15   Time 8  Period  Weeks   Status New               Plan - 03/14/15 1300    Clinical Impression Statement Pt demo incr coordination and ability to write with R hand.   Plan neuro re-ed, RUE functional use   OT Home Exercise Plan education issued:  12/12/14 Initial HEP and bed positioning handout, A/E recommendations; 01/31/15 updated RUE functional use HEP, full composite ext splint issued 11/2   Consulted and Agree with Plan of Care Patient;Family member/caregiver   Family Member Consulted wife [name]        Problem List Patient Active Problem List   Diagnosis Date Noted  . Essential hypertension 01/23/2015  . Hyperlipidemia 01/23/2015  . OSA (obstructive sleep apnea) 01/23/2015  . HLD (hyperlipidemia)   . CVA (cerebral infarction) 11/23/2014  . Stroke ([place]) 11/23/2014  . Hypertension 11/23/2014    [facility] 03/14/2015, 1:01 PM  [facility] [address] [place], [place], [address] Phone: [phone]   Fax:  [phone]  Name: Lealon Vanputten MRN: [id number] Date of Birth: [birth year]  Vianne Bulls, OTR/L 03/14/2015 1:02 PM

## 2015-03-20 ENCOUNTER — Ambulatory Visit: Payer: BLUE CROSS/BLUE SHIELD | Admitting: Occupational Therapy

## 2015-03-20 ENCOUNTER — Encounter: Payer: Self-pay | Admitting: Occupational Therapy

## 2015-03-20 DIAGNOSIS — G8191 Hemiplegia, unspecified affecting right dominant side: Secondary | ICD-10-CM

## 2015-03-20 DIAGNOSIS — M25511 Pain in right shoulder: Secondary | ICD-10-CM

## 2015-03-20 NOTE — Therapy (Signed)
[facility] [address] [place], [place], [address] Phone: [phone]   Fax:  [phone]  Occupational Therapy Treatment  Patient Details  Name: Phillip Curry MRN: [id number] Date of Birth: [birth year] No Data Recorded  Encounter Date: 03/20/2015      OT End of Session - 03/20/15 1200    Visit Number 31   Number of Visits 33   Date for OT Re-Evaluation 04/01/15   Authorization Type [employer] 120 visits combined, no auth   Authorization Time Period renewal completed 01/31/15, wk 7/8   Authorization - Number of Visits 40   OT Start Time 1015   OT Stop Time 1100   OT Time Calculation (min) 45 min   Activity Tolerance Patient limited by pain      Past Medical History  Diagnosis Date  . Hypertension   . Stroke [facility])     Past Surgical History  Procedure Laterality Date  . No past surgeries      There were no vitals filed for this visit.  Visit Diagnosis:  Pain in joint of right shoulder  Hemiplegia affecting right dominant side [facility])      Subjective Assessment - 03/20/15 1027    Patient is accompained by: Family member   Pertinent History see Epic snapshot   Patient Stated Goals to get better   Currently in Pain? Yes   Pain Score 3    Pain Location Shoulder   Pain Orientation Right   Pain Descriptors / Indicators Aching   Pain Type Chronic pain   Pain Onset More than a month ago   Pain Frequency Intermittent   Aggravating Factors  malpositioning   Pain Relieving Factors changing position                      OT Treatments/Exercises (OP) - 03/20/15 0001    Neurological Re-education Exercises   Other Exercises 1 Attempted high range AA/ROM RUE with UE Ranger at beginning of session but d/c due to pain. Modified exercises to perform BUE shoulder flexion in supine, however pt still had pain. Therefore, then performed taping to shoulder (see below)   Other Exercises 2 After taping, pt  returned to mat to perform BUE AA/ROM in high level sh. flexion with 1 lb. weight and then with ball with no pain. Pt also performed seated to chest level with no pain. Attempted UE Ranger again, but continued to have pain with this, therefore d/c activity for today   Manual Therapy   Manual Therapy Taping   Manual therapy comments To relax biceps and pectoralis, both I strips (with noted point tenderness during palpation at biceps insertion and along pects). Also a correction piece for ER at [facility] joint, and piece to activate scapula retraction and trunk extension   Kinesiotex Inhibit Muscle;Facilitate Muscle                  OT Short Term Goals - 03/02/15 1622    OT SHORT TERM GOAL #1   Title I with HEP   Baseline 01/07/15   Time 4   Period Weeks   Status Achieved   OT SHORT TERM GOAL #2   Title Pt will perform bathing with min A   Time 4   Period Weeks   Status Achieved   OT SHORT TERM GOAL #3   Title Pt will perform dressing with supervision/ setup   Time 4   Period Weeks   Status Achieved  except  tying shoes (pt leaves tied. O.T. shown alternative options/AE)   OT SHORT TERM GOAL #4   Title Pt will use RUE as a stabilizer with minA   Time 4   Period Weeks   Status Achieved  01/31/15   OT SHORT TERM GOAL #5   Title Pt will demonstrate 50% A/ROM elbow flexion/ extension.   Time 4   Period Weeks   Status Achieved   OT SHORT TERM GOAL #6   Title Pt will be modified independent with RUE positioning to minimize pain, risk for injury including splint wear PRN.   Time 4   Period Weeks   Status Achieved   OT SHORT TERM GOAL #7   Title Pt will use RUE as a stabilizer/ gross A for ADLs at least 50% of the time.--check updated STGs 03/02/15   Baseline new for renewal 01/31/15   Time 4   Period Weeks   Status On-going  02/27/15:  20-25%   OT SHORT TERM GOAL #8   Title Pt will demo at least 100* R shoulder flex with minimal compensation for functional reaching.    Baseline new for renewal 01/31/15   Time 4   Period Weeks   Status On-going  02/27/15:  95*   OT SHORT TERM GOAL  #9   TITLE Pt will improve RUE coordination/functional reaching for ADLs as shown by improving score on box and blocks test by at least 5 with RUE.   Baseline new for renewal 01/31/15- 6 blocks    Time 4   Period Weeks   Status Achieved  03/02/15: Rt = 11           OT Long Term Goals - 01/31/15 1025    OT LONG TERM GOAL #1   Title Pt will use RUE as a stabilizer/ gross A for ADLs at least 25% of the time.   Baseline due 02/05/15--will update as STG for renewal period   Time 8   Period Weeks   Status Not Met  01/31/15  approx 10% per pt report   OT LONG TERM GOAL #2   Title Pt will demonstrate 30* shoulder flexion/ scaption in prep for functional reach.   Time 8   Period Weeks   Status Achieved  01/31/15 90* with minimal compensation   OT LONG TERM GOAL #3   Title Pt will perform all basic ADLs with distant supervision.   Time 8   Period Weeks   Status Achieved  01/31/15 met per pt except tying shoes (has been instructed in AE for this)   OT LONG TERM GOAL #4   Title Pt will perfom simple snack prep/ light home management with supervision.   Time 8   Period Weeks   Status Achieved  01/31/15 cold snack prep   OT LONG TERM GOAL #5   Title Pt will use RUE as a stabilizer/ gross A for ADLs at least 75% of the time.--check updated LTGs 04/01/15   Time 8   Period Weeks   Status New   Long Term Additional Goals   Additional Long Term Goals Yes   OT LONG TERM GOAL #6   Title Pt will demo at least 110* R shoulder flex with minimal compensation for functional reaching.   Time 8   Period Weeks   Status New   OT LONG TERM GOAL #7   Title Pt will improve RUE coordination/functional reaching for ADLs as shown by improving score on box and blocks test by at least  10 with RUE.   Baseline 6 blocks 01/31/15   Time 8   Period Weeks   Status New                Plan - 03/20/15 1201    Clinical Impression Statement Pt with increased pain in Rt shoulder today which limited ability to progress exercises. Pt responded well to kinesiotape and manual facilitation to prevent compensations   Plan begin checking LTG's in prep for d/c next week   OT Home Exercise Plan education issued:  12/12/14 Initial HEP and bed positioning handout, A/E recommendations; 01/31/15 updated RUE functional use HEP, full composite ext splint issued 11/2   Consulted and Agree with Plan of Care Patient;Family member/caregiver   Family Member Consulted wife [name]        Problem List Patient Active Problem List   Diagnosis Date Noted  . Essential hypertension 01/23/2015  . Hyperlipidemia 01/23/2015  . OSA (obstructive sleep apnea) 01/23/2015  . HLD (hyperlipidemia)   . CVA (cerebral infarction) 11/23/2014  . Stroke ([place]) 11/23/2014  . Hypertension 11/23/2014    Carey Bullocks, OTR/L 03/20/2015, 12:04 PM  [facility] [address] [place], [place], [address] Phone: [phone]   Fax:  [phone]  Name: Phillip Curry MRN: [id number] Date of Birth: 1955/09/08

## 2015-03-21 ENCOUNTER — Ambulatory Visit: Payer: BLUE CROSS/BLUE SHIELD | Admitting: Occupational Therapy

## 2015-03-21 ENCOUNTER — Encounter (INDEPENDENT_AMBULATORY_CARE_PROVIDER_SITE_OTHER): Payer: BLUE CROSS/BLUE SHIELD | Admitting: Neurology

## 2015-03-21 DIAGNOSIS — Z8673 Personal history of transient ischemic attack (TIA), and cerebral infarction without residual deficits: Secondary | ICD-10-CM

## 2015-03-21 DIAGNOSIS — G8191 Hemiplegia, unspecified affecting right dominant side: Secondary | ICD-10-CM

## 2015-03-21 DIAGNOSIS — G4733 Obstructive sleep apnea (adult) (pediatric): Secondary | ICD-10-CM | POA: Diagnosis not present

## 2015-03-21 DIAGNOSIS — R0683 Snoring: Secondary | ICD-10-CM

## 2015-03-21 DIAGNOSIS — R0681 Apnea, not elsewhere classified: Secondary | ICD-10-CM

## 2015-03-21 DIAGNOSIS — E663 Overweight: Secondary | ICD-10-CM

## 2015-03-22 ENCOUNTER — Ambulatory Visit: Payer: BLUE CROSS/BLUE SHIELD

## 2015-03-22 DIAGNOSIS — R41841 Cognitive communication deficit: Secondary | ICD-10-CM

## 2015-03-22 DIAGNOSIS — M25511 Pain in right shoulder: Secondary | ICD-10-CM | POA: Diagnosis not present

## 2015-03-22 NOTE — Therapy (Signed)
[facility] [address] [place], [place], [address] Phone: [phone]   Fax:  [phone]  Speech Language Pathology Treatment  Patient Details  Name: Phillip Curry MRN: [id number] Date of Birth: [birth year] No Data Recorded  Encounter Date: 03/22/2015      End of Session - 03/22/15 1449    Visit Number 28   Number of Visits 35   Date for SLP Re-Evaluation 03/30/15   SLP Start Time 1404   SLP Stop Time  1446   SLP Time Calculation (min) 42 min   Activity Tolerance Patient tolerated treatment well      Past Medical History  Diagnosis Date  . Hypertension   . Stroke [facility])     Past Surgical History  Procedure Laterality Date  . No past surgeries      There were no vitals filed for this visit.  Visit Diagnosis: Cognitive communication deficit      Subjective Assessment - 03/22/15 1406    Subjective "How was your trip - how was everything?"               ADULT SLP TREATMENT - 03/22/15 1408    General Information   Behavior/Cognition Pleasant mood;Cooperative;Requires cueing;Distractible   Treatment Provided   Treatment provided Cognitive-Linquistic   Pain Assessment   Pain Assessment 0-10   Pain Score 3    Pain Location rt shoulder   Pain Descriptors / Indicators Sore   Cognitive-Linquistic Treatment   Treatment focused on Cognition   Skilled Treatment Pt's cognitive linguistic skills (reasoning, attention) were targeted by SLP during therapy today. He req'd max A consistently with simple deductive reasoning puzzle (husbands/wives, houses/colors). SLP had to repeat directions and clues to pt occasionally. SLP cued pt that he attempted to move on to next task prematurely (he was not finished with puzzle yet).  In simple word organization task, pt req'd mod A usually.   Assessment / Recommendations / Plan   Plan Continue with current plan of care   Progression Toward Goals   Progression  toward goals --  Pt making good attempts at progressing towards goals            SLP Short Term Goals - 03/13/15 1156    SLP SHORT TERM GOAL #1   Title Pt will perform dysarthria HEP with rare minimal assistance   Time 1   Period Weeks   Status Achieved   SLP SHORT TERM GOAL #2   Title Pt will solve simple time, money, reasoning problems with 85% accuracy and occasional min A   Time 1   Period Weeks   Status Not Met   SLP SHORT TERM GOAL #3   Title Pt will manage meds at home with compensations with rare min A over 3 sessions as reported by spouse/pt.   Time 1   Period Weeks   Status Achieved          SLP Long Term Goals - 03/22/15 1416    SLP LONG TERM GOAL #1   Title Pt will be 90% intelligible in moderately complex conversation over 12 minutes   Time 1   Period Weeks   Status Achieved   SLP LONG TERM GOAL #2   Title Pt will solve simple time, money reasoning problems with 80% accuracy and occasional min A   Time 2   Period Weeks   Status On-going   SLP LONG TERM GOAL #3   Title Pt will utilize compensations for schedule, daily  chores, lists 2x during therapy session over 2 sessions with rare min A   Status Achieved   SLP LONG TERM GOAL #4   Title pt will demo emergent awareness in cognitive linguistic tasks with mod A occasionally   Time 2   Period Weeks   Status On-going   SLP LONG TERM GOAL #5   Title Pt will solve simple reasoning and organization cogntive linguistic problems with 80% accuracy and occasinal min A   Time 2   Period Weeks   Status On-going          Plan - 03/22/15 1450    Clinical Impression Statement Assistance cont to be needed with alternating attention as well as organization/problem solving. Maximizing his cognitive deficits as well as language output is still necessary for skilled ST to address.   Speech Therapy Frequency 2x / week   Duration --  7 more visits   Treatment/Interventions Compensatory strategies;Patient/family  education;Functional tasks;Cognitive reorganization;Internal/external aids;SLP instruction and feedback   Potential to Achieve Goals Good   Potential Considerations Ability to learn/carryover information;Severity of impairments        Problem List Patient Active Problem List   Diagnosis Date Noted  . Essential hypertension 01/23/2015  . Hyperlipidemia 01/23/2015  . OSA (obstructive sleep apnea) 01/23/2015  . HLD (hyperlipidemia)   . CVA (cerebral infarction) 11/23/2014  . Stroke ([place]) 11/23/2014  . Hypertension 11/23/2014    [facility] [place], [place]  03/22/2015, 2:51 PM  [facility] [address] [place], [place], [address] Phone: [phone]   Fax:  [phone]   Name: Phillip Curry MRN: [id number] Date of Birth: 01-26-1956

## 2015-03-22 NOTE — Patient Instructions (Signed)
  Please complete the assigned speech therapy homework and return it to your next session.  

## 2015-03-23 ENCOUNTER — Telehealth: Payer: Self-pay | Admitting: Neurology

## 2015-03-23 ENCOUNTER — Ambulatory Visit: Payer: BLUE CROSS/BLUE SHIELD | Attending: Internal Medicine | Admitting: *Deleted

## 2015-03-23 DIAGNOSIS — M25511 Pain in right shoulder: Secondary | ICD-10-CM | POA: Insufficient documentation

## 2015-03-23 DIAGNOSIS — G8191 Hemiplegia, unspecified affecting right dominant side: Secondary | ICD-10-CM | POA: Insufficient documentation

## 2015-03-23 DIAGNOSIS — R279 Unspecified lack of coordination: Secondary | ICD-10-CM | POA: Insufficient documentation

## 2015-03-23 DIAGNOSIS — I69898 Other sequelae of other cerebrovascular disease: Secondary | ICD-10-CM | POA: Insufficient documentation

## 2015-03-23 DIAGNOSIS — I6980 Unspecified sequelae of other cerebrovascular disease: Secondary | ICD-10-CM | POA: Diagnosis present

## 2015-03-23 DIAGNOSIS — G4733 Obstructive sleep apnea (adult) (pediatric): Secondary | ICD-10-CM

## 2015-03-23 DIAGNOSIS — R531 Weakness: Secondary | ICD-10-CM | POA: Insufficient documentation

## 2015-03-23 DIAGNOSIS — R41841 Cognitive communication deficit: Secondary | ICD-10-CM | POA: Diagnosis present

## 2015-03-23 DIAGNOSIS — IMO0002 Reserved for concepts with insufficient information to code with codable children: Secondary | ICD-10-CM

## 2015-03-23 NOTE — Telephone Encounter (Signed)
Patient referred by Dr. Erlinda Hong, seen by me on 02/16/15, HST on 03/21/15, ins: [employer].  Please call and notify the patient that the recent home sleep test did suggest the diagnosis of moderate obstructive sleep apnea and that I recommend treatment for this in the form of CPAP. I will request an overnight sleep study for proper titration and mask fitting. Please explain to patient and arrange for a CPAP titration study. I have placed an order in the chart. Thanks, and please route to [facility] for scheduling.   Star Age, MD, PhD [facility])

## 2015-03-23 NOTE — Therapy (Signed)
[facility] [address] [place], [place], [address] Phone: [phone]   Fax:  [phone]  Occupational Therapy Treatment  Patient Details  Name: Phillip Curry MRN: [id number] Date of Birth: 04/04/1956 No Data Recorded  Encounter Date: 03/23/2015      OT End of Session - 03/23/15 1219    Visit Number 32   Number of Visits 33   Date for OT Re-Evaluation 04/01/15   Authorization Type [employer] 120 visits combined, no auth   Authorization Time Period renewal completed 01/31/15, wk 7/8   Authorization - Number of Visits 40   OT Start Time 1015   OT Stop Time 1100   OT Time Calculation (min) 45 min   Activity Tolerance Patient tolerated treatment well   Behavior During Therapy [facility] for tasks assessed/performed      Past Medical History  Diagnosis Date  . Hypertension   . Stroke [facility])     Past Surgical History  Procedure Laterality Date  . No past surgeries      There were no vitals filed for this visit.  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 Yes   Pain Score 3    Pain Location Shoulder   Pain Orientation Right   Pain Descriptors / Indicators Aching   Pain Type Chronic pain   Pain Onset More than a month ago   Pain Frequency Intermittent   Aggravating Factors  malpositioning   Pain Relieving Factors changing positions, pt reports shoulder retraction and turning hand/shoulder in neutral alignment    Multiple Pain Sites No       While seated edge of mat, pt performed shoulder retraction exercises X10. Therapist provided cues for full  ROM and keeping shoulder depressed. Patient laid in supine position for NMR to right shoulder. Focused on shoulder flexion/extension, shoulder press using pipe square, and minimal abduction (minimal pain during abduction). Therapist again provided cueing for thumb to lead way in order to insure arm in neutral position. Pt then sat EOB and patient simulated bathing of LUE using RUE. Patient with no pain at this point. Therapist encouraged patient's wife to provide hand over hand assistance to increase patient's independence with washing LUE and under arm. Pt then worked on functional reaching activities/exercises. Pt able to functionally flex R shoulder to 100*. Encouraged patient to work on reaching activities at home to reach his goal of 110*. Pt performed box and blocks and exceeded LTG. Pt continues to be very motivated and encouraged to decrease shoulder pain and increase overall ADL and functional independence. Pt with 0-3/10 complaints of pain at end of session.                        OT Education - 03/23/15 1205    Education provided Yes   Education Details Proper position of RUE during exercises, using RUE for functional tasks (wife to provide hand over hand during bathing), use of RUE for reaching activites. Discussed importance of stopping exercise with increased pain.    Person(s) Educated Patient;Spouse   Methods  Explanation;Demonstration   Comprehension Verbalized understanding;Returned demonstration          OT Short Term Goals - 03/23/15 1216    OT SHORT TERM GOAL #1   Title I with HEP   Baseline 01/07/15   Time 4   Period Weeks   Status Achieved   OT SHORT TERM GOAL #2   Title Pt will perform bathing with min A   Time 4   Period Weeks   Status Achieved   OT SHORT TERM GOAL #3   Title Pt will perform dressing with supervision/ setup   Time 4   Period Weeks   Status Achieved  except tying shoes (pt leaves tied. O.T. shown alternative options/AE)   OT  SHORT TERM GOAL #4   Title Pt will use RUE as a stabilizer with minA   Time 4   Period Weeks   Status Achieved  01/31/15   OT SHORT TERM GOAL #5   Title Pt will demonstrate 50% A/ROM elbow flexion/ extension.   Time 4   Period Weeks   Status Achieved   OT SHORT TERM GOAL #6   Title Pt will be modified independent with RUE positioning to minimize pain, risk for injury including splint wear PRN.   Time 4   Period Weeks   Status Achieved   OT SHORT TERM GOAL #7   Title Pt will use RUE as a stabilizer/ gross A for ADLs at least 50% of the time.--check updated STGs 03/02/15   Baseline new for renewal 01/31/15   Time 4   Period Weeks   Status On-going  02/27/15:  20-25%   OT SHORT TERM GOAL #8   Title Pt will demo at least 100* R shoulder flex with minimal compensation for functional reaching.   Baseline new for renewal 01/31/15   Time 4   Period Weeks   Status Achieved  02/27/15:  95*   OT SHORT TERM GOAL  #9   TITLE Pt will improve RUE coordination/functional reaching for ADLs as shown by improving score on box and blocks test by at least 5 with RUE.   Baseline new for renewal 01/31/15- 6 blocks    Time 4   Period Weeks   Status Achieved  03/02/15: Rt = 11           OT Long Term Goals - 03/23/15 1217    OT LONG TERM GOAL #1   Title Pt will use RUE as a stabilizer/ gross A for ADLs at least 25% of the time.   Baseline due 02/05/15--will update as STG for renewal period   Time 8   Period Weeks   Status Not Met  01/31/15  approx 10% per pt report   OT LONG TERM GOAL #2   Title Pt will demonstrate 30* shoulder flexion/ scaption in prep for functional reach.   Time 8   Period Weeks   Status Achieved  01/31/15 90* with minimal compensation   OT LONG TERM GOAL #3   Title Pt will perform all basic ADLs with distant supervision.   Time 8   Period Weeks   Status Achieved  01/31/15 met per pt except tying shoes (has been instructed in AE for this)   OT LONG TERM GOAL #4    Title Pt will perfom simple snack prep/ light home management with supervision.   Time 8   Period Weeks   Status Achieved  01/31/15 cold snack prep   OT LONG TERM GOAL #5  Title Pt will use RUE as a stabilizer/ gross A for ADLs at least 75% of the time.--check updated LTGs 04/01/15   Time 8   Period Weeks   Status On-going   OT LONG TERM GOAL #6   Title Pt will demo at least 110* R shoulder flex with minimal compensation for functional reaching.   Time 8   Period Weeks   Status On-going  100*   OT LONG TERM GOAL #7   Title Pt will improve RUE coordination/functional reaching for ADLs as shown by improving score on box and blocks test by at least 10 with RUE.   Baseline 6 blocks 01/31/15   Time 8   Period Weeks   Status Achieved  27 blocks               Plan - 03/23/15 1213    Clinical Impression Statement Pt states his shoulder pain is better today (in comparison to last OT session). Pt continues to progress and is very motivated to meet OT goals. Wife was present and is supportive and provides the appropriate cueing prn.    Pt will benefit from skilled therapeutic intervention in order to improve on the following deficits (Retired) Abnormal gait;Decreased coordination;Decreased range of motion;Difficulty walking;Decreased endurance;Decreased activity tolerance;Decreased knowledge of precautions;Impaired tone;Pain;Impaired UE functional use;Decreased knowledge of use of DME;Decreased cognition;Decreased mobility;Decreased strength;Impaired perceived functional ability   Rehab Potential Good   OT Frequency 2x / week   OT Duration 8 weeks   OT Treatment/Interventions Self-care/ADL training;Moist Heat;Fluidtherapy;DME and/or AE instruction;Patient/family education;Balance training;Therapeutic exercises;Contrast Bath;Ultrasound;Therapeutic exercise;Therapeutic activities;Cognitive remediation/compensation;Passive range of motion;Functional Mobility Training;Neuromuscular  education;Cryotherapy;Electrical Stimulation;Parrafin;Energy conservation;Manual Therapy;Visual/perceptual remediation/compensation   Plan check remainder of LTGs   OT Home Exercise Plan education issued:  12/12/14 Initial HEP and bed positioning handout, A/E recommendations; 01/31/15 updated RUE functional use HEP, full composite ext splint issued 11/2   Consulted and Agree with Plan of Care Patient;Family member/caregiver   Family Member Consulted wife [name]        Problem List Patient Active Problem List   Diagnosis Date Noted  . Essential hypertension 01/23/2015  . Hyperlipidemia 01/23/2015  . OSA (obstructive sleep apnea) 01/23/2015  . HLD (hyperlipidemia)   . CVA (cerebral infarction) 11/23/2014  . Stroke ([place]) 11/23/2014  . Hypertension 11/23/2014    Edlin Ford , MS, OTR/L, CLT Pager: [phone]  03/23/2015, 12:23 PM  [facility] [address] [place], [place], [address] Phone: [phone]   Fax:  [phone]  Name: Taris Galindo MRN: [id number] Date of Birth: Nov 24, 1955

## 2015-03-23 NOTE — Telephone Encounter (Signed)
I spoke to patient and his wife. They are aware of results and would like to proceed with treatment. They are coming by the office today and will stop by sleep lab to see if they can go ahead and get scheduled.

## 2015-03-24 ENCOUNTER — Ambulatory Visit: Payer: BLUE CROSS/BLUE SHIELD

## 2015-03-24 DIAGNOSIS — R41841 Cognitive communication deficit: Secondary | ICD-10-CM

## 2015-03-24 DIAGNOSIS — M25511 Pain in right shoulder: Secondary | ICD-10-CM | POA: Diagnosis not present

## 2015-03-24 NOTE — Patient Instructions (Signed)
  Please complete the assigned speech therapy homework and return it to your next session.  

## 2015-03-24 NOTE — Therapy (Signed)
[facility] [address] [place], [place], [address] Phone: [phone]   Fax:  [phone]  Speech Language Pathology Treatment  Patient Details  Name: Phillip Curry MRN: [id number] Date of Birth: 07/15/1955 No Data Recorded  Encounter Date: 03/24/2015      End of Session - 03/24/15 1356    Visit Number 29   Number of Visits 35   Date for SLP Re-Evaluation 03/30/15   SLP Start Time 9977   SLP Stop Time  1400   SLP Time Calculation (min) 43 min   Activity Tolerance Patient tolerated treatment well      Past Medical History  Diagnosis Date  . Hypertension   . Stroke [facility])     Past Surgical History  Procedure Laterality Date  . No past surgeries      There were no vitals filed for this visit.  Visit Diagnosis: Cognitive communication deficit             ADULT SLP TREATMENT - 03/24/15 1345    General Information   Behavior/Cognition Pleasant mood;Cooperative;Requires cueing   Treatment Provided   Treatment provided Cognitive-Linquistic   Pain Assessment   Pain Assessment 0-10   Pain Score 3    Pain Location rt shoulder   Pain Descriptors / Indicators Sore   Cognitive-Linquistic Treatment   Treatment focused on Cognition   Skilled Treatment Organization and attention targeted today in therapy by SLP by pt completing written and reading tasks; same deductuve-type task as last session. Pt req'd max A for attention, organization, and reasoning in those tasks. SLP simplified the task by crossing out options known to be false after pt figured out the clues with SLP A. After this pt did not demo improvement in organization/reasoning skills.   Assessment / Recommendations / Plan   Plan Continue with current plan of care   Progression Toward Goals   Progression toward goals --  pt motivated to progress toward goals            SLP Short Term Goals - 03/13/15 1156    SLP SHORT TERM GOAL #1   Title Pt will perform dysarthria HEP with rare minimal assistance   Time 1   Period Weeks   Status Achieved   SLP SHORT TERM GOAL #2   Title Pt will solve simple time, money, reasoning problems with 85% accuracy and occasional min A   Time 1   Period Weeks   Status Not Met   SLP SHORT TERM GOAL #3   Title Pt will manage meds at home with compensations with rare min A over 3 sessions as reported by spouse/pt.   Time 1   Period Weeks   Status Achieved          SLP Long Term Goals - 03/24/15 1658    SLP LONG TERM GOAL #1   Title Pt will be 90% intelligible in moderately complex conversation over 12 minutes   Time 1   Period Weeks   Status Achieved   SLP LONG TERM GOAL #2   Title Pt will solve simple time, money reasoning problems with 80% accuracy and occasional min A   Time 6   Period Weeks  visits   Status On-going   SLP LONG TERM GOAL #3   Title Pt will utilize compensations for schedule, daily chores, lists 2x during therapy session over 2 sessions with rare min A   Status Achieved   SLP LONG TERM GOAL #4  Title pt will demo emergent awareness in cognitive linguistic tasks with mod A occasionally   Time 6   Period --  visits   Status On-going   SLP LONG TERM GOAL #5   Title Pt will solve simple reasoning and organization cogntive linguistic problems with 80% accuracy and occasinal min A   Time 6   Period --  visits   Status On-going          Plan - 03/24/15 1657    Clinical Impression Statement Assistance cont to be needed with alternating attention as well as organization/problem solving. Maximizing his cognitive deficits as well as language output is still necessary for skilled ST to address.   Speech Therapy Frequency 2x / week   Duration --  6 more visits   Treatment/Interventions Compensatory strategies;Patient/family education;Functional tasks;Cognitive reorganization;Internal/external aids;SLP instruction and feedback   Potential to Achieve Goals Good    Potential Considerations Ability to learn/carryover information;Severity of impairments        Problem List Patient Active Problem List   Diagnosis Date Noted  . Essential hypertension 01/23/2015  . Hyperlipidemia 01/23/2015  . OSA (obstructive sleep apnea) 01/23/2015  . HLD (hyperlipidemia)   . CVA (cerebral infarction) 11/23/2014  . Stroke ([place]) 11/23/2014  . Hypertension 11/23/2014    [facility] , [place], [place]  03/24/2015, 4:59 PM  [facility] [address] [place], [place], [address] Phone: [phone]   Fax:  [phone]   Name: Phillip Curry MRN: [id number] Date of Birth: [birth year]

## 2015-03-27 ENCOUNTER — Ambulatory Visit: Payer: BLUE CROSS/BLUE SHIELD | Admitting: Occupational Therapy

## 2015-03-27 ENCOUNTER — Encounter: Payer: Self-pay | Admitting: Occupational Therapy

## 2015-03-27 DIAGNOSIS — G8191 Hemiplegia, unspecified affecting right dominant side: Secondary | ICD-10-CM

## 2015-03-27 DIAGNOSIS — M25511 Pain in right shoulder: Secondary | ICD-10-CM

## 2015-03-27 DIAGNOSIS — IMO0002 Reserved for concepts with insufficient information to code with codable children: Secondary | ICD-10-CM

## 2015-03-27 NOTE — Therapy (Signed)
[facility] [address] [place], [place], [address] Phone: [phone]   Fax:  [phone]  Occupational Therapy Treatment  Patient Details  Name: Phillip Curry MRN: [id number] Date of Birth: 1955/12/17 No Data Recorded  Encounter Date: 03/27/2015      OT End of Session - 03/27/15 1252    Visit Number 33   Number of Visits 33   Date for OT Re-Evaluation 04/01/15   Authorization Type [employer] 120 visits combined, no auth   Authorization Time Period renewal completed 01/31/15, wk 8/8   Authorization - Number of Visits 40   OT Start Time 1015   OT Stop Time 1100   OT Time Calculation (min) 45 min   Activity Tolerance Patient tolerated treatment well      Past Medical History  Diagnosis Date  . Hypertension   . Stroke [facility])     Past Surgical History  Procedure Laterality Date  . No past surgeries      There were no vitals filed for this visit.  Visit Diagnosis:  Pain in joint of right shoulder  Hemiplegia affecting right dominant side (HCC)  Lack of coordination due to stroke  Weakness due to cerebrovascular accident      Subjective Assessment - 03/27/15 1025    Subjective  You have been so helpful. Thank you   Pertinent History see Epic snapshot   Patient Stated Goals to get better   Currently in Pain? Yes   Pain Score 3    Pain Location Shoulder   Pain Orientation Right   Pain Descriptors / Indicators Aching   Pain Type Chronic pain   Pain Onset More than a month ago   Pain Frequency Intermittent   Aggravating Factors  Malpositioning   Pain Relieving Factors proper positioning, stretching                      OT Treatments/Exercises (OP) - 03/27/15 0001    ADLs   ADL Comments Pt/wife instructed on how to apply kinesiotape correctly at home for pain management (wife videotaped for instruction). Assessed remaining LTG's. Addressed all remaining questions and what to focus on at  home as well as emphasis on proper positioning with movement and functional use. Also discussed botox as potential option for pain and spasticity management if RUE worsens   Manual Therapy   Manual therapy comments To relax biceps and pectoralis, both I strips (with noted point tenderness during palpation at biceps insertion and along pects). Also a correction piece for ER at [facility] joint, and piece to activate scapula retraction and trunk extension   Kinesiotex Inhibit Muscle;Facilitate Muscle                OT Education - 03/27/15 1103    Education provided Yes   Education Details Proper application of kinesiotape of Rt shoulder to manage/reduce shoulder pain   Person(s) Educated Patient;Spouse   Methods Explanation;Demonstration;Other (comment)  Wife videotaped application and instructions   Comprehension Verbalized understanding;Returned demonstration          OT Short Term Goals - 03/27/15 1026    OT SHORT TERM GOAL #1   Title I with HEP   Baseline 01/07/15   Time 4   Period Weeks   Status Achieved   OT SHORT TERM GOAL #2   Title Pt will perform bathing with min A   Time 4   Period Weeks   Status Achieved   OT SHORT TERM GOAL #  3   Title Pt will perform dressing with supervision/ setup   Time 4   Period Weeks   Status Achieved  except tying shoes (pt leaves tied. O.T. shown alternative options/AE)   OT SHORT TERM GOAL #4   Title Pt will use RUE as a stabilizer with minA   Time 4   Period Weeks   Status Achieved  01/31/15   OT SHORT TERM GOAL #5   Title Pt will demonstrate 50% A/ROM elbow flexion/ extension.   Time 4   Period Weeks   Status Achieved   OT SHORT TERM GOAL #6   Title Pt will be modified independent with RUE positioning to minimize pain, risk for injury including splint wear PRN.   Time 4   Period Weeks   Status Achieved   OT SHORT TERM GOAL #7   Title Pt will use RUE as a stabilizer/ gross A for ADLs at least 50% of the time.--check updated  STGs 03/02/15   Baseline new for renewal 01/31/15   Time 4   Period Weeks   Status Achieved  03/27/15: approx 50%   OT SHORT TERM GOAL #8   Title Pt will demo at least 100* R shoulder flex with minimal compensation for functional reaching.   Baseline new for renewal 01/31/15   Time 4   Period Weeks   Status Achieved  03/27/15: 100-105*   OT SHORT TERM GOAL  #9   TITLE Pt will improve RUE coordination/functional reaching for ADLs as shown by improving score on box and blocks test by at least 5 with RUE.   Baseline new for renewal 01/31/15- 6 blocks    Time 4   Period Weeks   Status Achieved  03/02/15: Rt = 11           OT Long Term Goals - 03/27/15 1100    OT LONG TERM GOAL #1   Title Pt will use RUE as a stabilizer/ gross A for ADLs at least 25% of the time.   Baseline due 02/05/15--will update as STG for renewal period   Time 8   Period Weeks   Status Achieved  approx 50%   OT LONG TERM GOAL #2   Title Pt will demonstrate 30* shoulder flexion/ scaption in prep for functional reach.   Time 8   Period Weeks   Status Achieved  01/31/15 90* with minimal compensation   OT LONG TERM GOAL #3   Title Pt will perform all basic ADLs with distant supervision.   Time 8   Period Weeks   Status Achieved  01/31/15 met per pt except tying shoes (has been instructed in AE for this)   OT LONG TERM GOAL #4   Title Pt will perfom simple snack prep/ light home management with supervision.   Time 8   Period Weeks   Status Achieved  01/31/15 cold snack prep   OT LONG TERM GOAL #5   Title Pt will use RUE as a stabilizer/ gross A for ADLs at least 75% of the time.--check updated LTGs 04/01/15   Time 8   Period Weeks   Status Not Met  approx. 50%   OT LONG TERM GOAL #6   Title Pt will demo at least 110* R shoulder flex with minimal compensation for functional reaching.   Time 8   Period Weeks   Status Not Met  100* - 105*   OT LONG TERM GOAL #7   Title Pt will improve RUE  coordination/functional reaching  for ADLs as shown by improving score on box and blocks test by at least 10 with RUE.   Baseline 6 blocks 01/31/15   Time 8   Period Weeks   Status Achieved  27 blocks               Plan - 03/27/15 1101    Clinical Impression Statement Pt met all STG's and LTG's except LTG #5 and #6. Pt has improved in functional use of RUE but still has pain occasionally with improper positioning during movement and requires min cues to correct to reduce/prevent pain.    Plan D/C O.T.    OT Home Exercise Plan education issued:  12/12/14 Initial HEP and bed positioning handout, A/E recommendations; 01/31/15 updated RUE functional use HEP, full composite ext splint issued 11/2   Consulted and Agree with Plan of Care Patient;Family member/caregiver   Family Member Consulted wife [name]        Problem List Patient Active Problem List   Diagnosis Date Noted  . Essential hypertension 01/23/2015  . Hyperlipidemia 01/23/2015  . OSA (obstructive sleep apnea) 01/23/2015  . HLD (hyperlipidemia)   . CVA (cerebral infarction) 11/23/2014  . Stroke ([place]) 11/23/2014  . Hypertension 11/23/2014     OCCUPATIONAL THERAPY DISCHARGE SUMMARY  Visits from Start of Care: 33  Current functional level related to goals / functional outcomes: See above   Remaining deficits: Occasional pain RUE which limits progress at this time Spasticity RUE   Education / Equipment: HEP's, A/E recommendations, bed positioning and positioning of RUE with movement, kinesiotape application and other pain reduction strategies  Plan: Patient agrees to discharge.  Patient goals were partially met. Patient is being discharged due to                                                     Reaching maximal rehab potential at this time?????        Teena Irani 03/27/2015, 12:54 PM  [facility] [address] [place], [place], [address] Phone: [phone]   Fax:  [phone]  Name: Phillip Curry MRN: [id number] Date of Birth: [birth year]

## 2015-03-30 ENCOUNTER — Ambulatory Visit: Payer: BLUE CROSS/BLUE SHIELD

## 2015-03-30 ENCOUNTER — Ambulatory Visit: Payer: BLUE CROSS/BLUE SHIELD | Admitting: Occupational Therapy

## 2015-03-30 DIAGNOSIS — R41841 Cognitive communication deficit: Secondary | ICD-10-CM

## 2015-03-30 DIAGNOSIS — M25511 Pain in right shoulder: Secondary | ICD-10-CM | POA: Diagnosis not present

## 2015-03-30 NOTE — Patient Instructions (Signed)
I think we are nearing the end of this round of speech therapy. Our last session will likely be in the next 2 weeks, due to progress beginning to level out in thinking skills.  Often times people will take a break from formal therapy and will have material to continue to work on at home. After some time, they begin to "click", and begin to progress a lot with the work at home  - then they return to therapy again. This may very well happen with you.

## 2015-03-30 NOTE — Therapy (Signed)
[facility] [address] [place], [place], [address] Phone: [phone]   Fax:  [phone]  Speech Language Pathology Treatment  Patient Details  Name: Phillip Curry MRN: [id number] Date of Birth: 09-Aug-1955 No Data Recorded  Encounter Date: 03/30/2015      End of Session - 03/30/15 1425    Visit Number 30   Number of Visits 35   Date for SLP Re-Evaluation 03/30/15   SLP Start Time 1404   SLP Stop Time  4166   SLP Time Calculation (min) 41 min      Past Medical History  Diagnosis Date  . Hypertension   . Stroke [facility])     Past Surgical History  Procedure Laterality Date  . No past surgeries      There were no vitals filed for this visit.  Visit Diagnosis: No diagnosis found.      Subjective Assessment - 03/30/15 1409    Subjective Pt diagnosed with mod OSA (obstructive sleep apnea) last week. Pt obtaining CPAP.               ADULT SLP TREATMENT - 03/30/15 1412    General Information   Behavior/Cognition Pleasant mood;Cooperative;Requires cueing;Alert   Treatment Provided   Treatment provided Cognitive-Linquistic   Pain Assessment   Pain Assessment 0-10   Pain Score 3    Pain Location rt shoulder   Pain Descriptors / Indicators Sore   Pain Intervention(s) Monitored during session   Cognitive-Linquistic Treatment   Treatment focused on Cognition   Skilled Treatment Pt did not follow instructions for homework, as he gave words associated with, or different types of the target words instead of the "parts" of the target words. SLP told pt to cont with this homework, correctly, for next session. Max A for organization and attention in same deductive reasoning task as previous session. Max A needed for organization and problem solving/reasoning. Awareness of errors in this task when cued by SLP 25% of the time.   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   Time 1   Period Weeks   Status Achieved          SLP Long Term Goals - 03/30/15 1626    SLP LONG TERM GOAL #1   Title Pt will be 90% intelligible in moderately complex conversation over 12 minutes   Time 1  Period Weeks   Status Achieved   SLP LONG TERM GOAL #2   Title Pt will solve simple time, money reasoning problems with 80% accuracy and occasional min A   Time 6   Period Weeks  visits   Status On-going   SLP LONG TERM GOAL #3   Title Pt will utilize compensations for schedule, daily chores, lists 2x during therapy session over 2 sessions with rare min A   Status Achieved   SLP LONG TERM GOAL #4   Title pt will demo emergent awareness in cognitive linguistic tasks with mod A occasionally   Time 6   Period --  visits   Status On-going   SLP LONG TERM GOAL #5   Title Pt will solve simple reasoning and organization cogntive linguistic problems with 80% accuracy and occasinal min A   Time 6   Period --  visits   Status On-going          Plan - 03/30/15 1626    Clinical Impression Statement Assistance cont to be  needed with alternating attention as well as organization/problem solving. Maximizing his cognitive deficits as well as language output is still necessary for skilled ST to address.   Speech Therapy Frequency 2x / week   Duration --  5 more visits   Treatment/Interventions Compensatory strategies;Patient/family education;Functional tasks;Cognitive reorganization;Internal/external aids;SLP instruction and feedback   Potential to Achieve Goals Good   Potential Considerations Ability to learn/carryover information;Severity of impairments        Problem List Patient Active Problem List   Diagnosis Date Noted  . Essential hypertension 01/23/2015  . Hyperlipidemia 01/23/2015  . OSA (obstructive sleep apnea) 01/23/2015  . HLD (hyperlipidemia)   . CVA (cerebral infarction) 11/23/2014  . Stroke ([place]) 11/23/2014  . Hypertension 11/23/2014    [facility] , [place], [place]  03/30/2015, 4:29 PM  [facility] [address] [place], [place], [address] Phone: [phone]   Fax:  [phone]   Name: Phillip Curry MRN: [id number] Date of Birth: Feb 17, 1956

## 2015-03-31 ENCOUNTER — Ambulatory Visit: Payer: BLUE CROSS/BLUE SHIELD

## 2015-03-31 DIAGNOSIS — R41841 Cognitive communication deficit: Secondary | ICD-10-CM

## 2015-03-31 DIAGNOSIS — M25511 Pain in right shoulder: Secondary | ICD-10-CM | POA: Diagnosis not present

## 2015-03-31 NOTE — Therapy (Signed)
[facility] [address] [place], [place], [address] Phone: [phone]   Fax:  [phone]  Speech Language Pathology Treatment  Patient Details  Name: Phillip Curry MRN: [id number] Date of Birth: 1956-03-21 No Data Recorded  Encounter Date: 03/31/2015      End of Session - 03/31/15 1534    Visit Number 31   Number of Visits 35   Date for SLP Re-Evaluation 03/30/15   SLP Start Time 1447   SLP Stop Time  1531   SLP Time Calculation (min) 44 min   Activity Tolerance Patient tolerated treatment well      Past Medical History  Diagnosis Date  . Hypertension   . Stroke [facility])     Past Surgical History  Procedure Laterality Date  . No past surgeries      There were no vitals filed for this visit.  Visit Diagnosis: Cognitive communication deficit      Subjective Assessment - 03/30/15 1409    Subjective Pt diagnosed with mod OSA (obstructive sleep apnea) last week. Pt obtaining CPAP.               ADULT SLP TREATMENT - 03/31/15 1457    General Information   Behavior/Cognition Pleasant mood;Cooperative;Requires cueing;Alert   Treatment Provided   Treatment provided Cognitive-Linquistic   Pain Assessment   Pain Assessment 0-10   Pain Score 3    Pain Location rt shoulder   Pain Descriptors / Indicators Sore   Pain Intervention(s) Monitored during session   Cognitive-Linquistic Treatment   Treatment focused on Cognition   Skilled Treatment Pt told SLP that he normally is asked about his homework but did not spontaneously retrieve from notebook, like occurs each day with SLP at beginning of session. Pt told SLP generalities of the homework when he was asked about it again, but did not retrieve the homework. Req'd SLP direct cue to retrieve his homework. With homework, pt again described the items instead of writing  two PARTS of the item as discussed yesterday during session and highlighted. SLP  mod-max cues needed consistently for telling SLP *parts* instead of a description of the item.    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   Time 1   Period Weeks   Status Achieved          SLP Long Term Goals - 03/31/15  [place] #1   Title Pt will be 90% intelligible in moderately complex conversation over 12 minutes   Time 1   Period Weeks   Status Achieved   SLP LONG TERM GOAL #2   Title Pt will solve simple time, money reasoning problems with 80% accuracy and occasional min A   Time 4   Period Weeks  visits   Status On-going   SLP LONG TERM GOAL #3   Title Pt will utilize compensations for schedule, daily chores, lists 2x during therapy session over 2 sessions with rare min A   Status Achieved   SLP LONG TERM GOAL #4   Title pt will demo emergent awareness in cognitive linguistic tasks with mod A occasionally   Time 4   Period --  visits   Status On-going   SLP LONG TERM GOAL #5   Title Pt will solve simple reasoning and organization cogntive linguistic problems with 80%  accuracy and occasinal min A   Time 4   Period --  visits   Status On-going          Plan - 03/31/15 1534    Clinical Impression Statement Pt req'd mod-max A today for mental flexibility, consistently. Pt awareness of errors was minmal re: telling description instead of *parts* of an item/object. Sklled ST needed for approx one-two more week to maximize cognitive-linguistic skills to minimze caregiver burden and incr independence.   Speech Therapy Frequency 2x / week   Duration --  4 more visits   Treatment/Interventions Compensatory strategies;Patient/family education;Functional tasks;Cognitive reorganization;Internal/external aids;SLP instruction and feedback   Potential to Achieve Goals Fair   Potential Considerations Ability to learn/carryover information;Severity of impairments        Problem List Patient Active Problem List   Diagnosis Date Noted  . Essential hypertension 01/23/2015  . Hyperlipidemia 01/23/2015  . OSA (obstructive sleep apnea) 01/23/2015  . HLD (hyperlipidemia)   . CVA (cerebral infarction) 11/23/2014  . Stroke ([place]) 11/23/2014  . Hypertension 11/23/2014    [facility] , [place], [place]  03/31/2015, 3:37 PM  [facility] [address] [place], [place], [address] Phone: [phone]   Fax:  [phone]   Name: Phillip Curry MRN: [id number] Date of Birth: 01-Aug-1955

## 2015-04-04 ENCOUNTER — Ambulatory Visit: Payer: BLUE CROSS/BLUE SHIELD | Admitting: Speech Pathology

## 2015-04-04 DIAGNOSIS — R41841 Cognitive communication deficit: Secondary | ICD-10-CM

## 2015-04-04 DIAGNOSIS — M25511 Pain in right shoulder: Secondary | ICD-10-CM | POA: Diagnosis not present

## 2015-04-04 NOTE — Therapy (Signed)
[facility] [address] [place], [place], [address] Phone: [phone]   Fax:  [phone]  Speech Language Pathology Treatment  Patient Details  Name: Phillip Curry MRN: [id number] Date of Birth: [birth year] No Data Recorded  Encounter Date: 04/04/2015      End of Session - 04/04/15 1402    Visit Number 32   Number of Visits 35   Date for SLP Re-Evaluation 03/30/15   SLP Start Time 1315   SLP Stop Time  1400   SLP Time Calculation (min) 45 min   Activity Tolerance Patient tolerated treatment well      Past Medical History  Diagnosis Date  . Hypertension   . Stroke [facility])     Past Surgical History  Procedure Laterality Date  . No past surgeries      There were no vitals filed for this visit.  Visit Diagnosis: Cognitive communication deficit      Subjective Assessment - 04/04/15 1324    Subjective "I told 2 parts of objects for [name]"   Patient is accompained by: Family member               ADULT SLP TREATMENT - 04/04/15 1326    General Information   Behavior/Cognition Pleasant mood;Cooperative;Requires cueing;Alert   Treatment Provided   Treatment provided Cognitive-Linquistic   Pain Assessment   Pain Assessment 0-10   Pain Score 3    Pain Location right shoulder   Pain Descriptors / Indicators Aching;Constant   Pain Intervention(s) Monitored during session   Cognitive-Linquistic Treatment   Treatment focused on Cognition   Skilled Treatment Abstract reasoning facilitated with similarities and differences with occasional min semantic cues - 80% accuracy.  Pt sequenced steps to organize new year's party for 13 with math for budget and menus with occasional min A and questioning cues.    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   Time 1   Period Weeks   Status Achieved          SLP Long Term Goals - 04/04/15 1401    SLP LONG TERM GOAL #1   Title Pt will be 90% intelligible in moderately complex conversation over 12 minutes   Time 1   Period Weeks   Status Achieved   SLP LONG TERM GOAL #2   Title Pt will solve simple time, money reasoning problems with 80% accuracy and occasional min A   Time 4   Period Weeks  visits   Status On-going   SLP LONG TERM GOAL #3   Title Pt will utilize compensations for schedule, daily chores, lists 2x during  therapy session over 2 sessions with rare min A   Status Achieved   SLP LONG TERM GOAL #4   Title pt will demo emergent awareness in cognitive linguistic tasks with mod A occasionally   Time 4   Period --  visits   Status On-going   SLP LONG TERM GOAL #5   Title Pt will solve simple reasoning and organization cogntive linguistic problems with 80% accuracy and occasinal min A   Time 4   Period --  visits   Status On-going          Plan - 04/04/15 1359    Clinical Impression Statement Pt req'd min to mod A today for mental flexibility, reasoning, organizing.  Sklled ST needed for approx one-two more week to maximize cognitive-linguistic skills to minimze caregiver burden and incr independence.   Speech Therapy Frequency 2x / week   Treatment/Interventions Compensatory strategies;Patient/family education;Functional tasks;Cognitive  reorganization;Internal/external aids;SLP instruction and feedback   Potential to Achieve Goals Fair   Potential Considerations Severity of impairments   Consulted and Agree with Plan of Care Patient        Problem List Patient Active Problem List   Diagnosis Date Noted  . Essential hypertension 01/23/2015  . Hyperlipidemia 01/23/2015  . OSA (obstructive sleep apnea) 01/23/2015  . HLD (hyperlipidemia)   . CVA (cerebral infarction) 11/23/2014  . Stroke ([place]) 11/23/2014  . Hypertension 11/23/2014    Conard Alvira, Annye Rusk MS, CCC-SLP 04/04/2015, 2:07 PM  [facility] [address] [place], [place], [address] Phone: [phone]   Fax:  [phone]   Name: Phillip Curry MRN: [id number] Date of Birth: 1955/09/07

## 2015-04-04 NOTE — Patient Instructions (Signed)
  Phillip Curry has 3 visits left based on our certification with insurance company. He will graduate 04/20/15. Thanks, Mickel Baas ST

## 2015-04-12 ENCOUNTER — Ambulatory Visit (INDEPENDENT_AMBULATORY_CARE_PROVIDER_SITE_OTHER): Payer: BLUE CROSS/BLUE SHIELD | Admitting: Neurology

## 2015-04-12 DIAGNOSIS — G479 Sleep disorder, unspecified: Secondary | ICD-10-CM

## 2015-04-12 DIAGNOSIS — G4720 Circadian rhythm sleep disorder, unspecified type: Secondary | ICD-10-CM

## 2015-04-12 DIAGNOSIS — G4733 Obstructive sleep apnea (adult) (pediatric): Secondary | ICD-10-CM | POA: Diagnosis not present

## 2015-04-13 NOTE — Sleep Study (Signed)
Please see the scanned sleep study interpretation located in the procedure tab within the chart review section.   

## 2015-04-14 ENCOUNTER — Ambulatory Visit: Payer: BLUE CROSS/BLUE SHIELD

## 2015-04-14 DIAGNOSIS — M25511 Pain in right shoulder: Secondary | ICD-10-CM | POA: Diagnosis not present

## 2015-04-14 DIAGNOSIS — R41841 Cognitive communication deficit: Secondary | ICD-10-CM

## 2015-04-14 NOTE — Patient Instructions (Signed)
Complete speech-thinking tasks everyday. A speech activity that I gave you, a session with brain games on the computer or in a book, or some sort of educational reading.  Treat this like your "job" right now. No TV unless in evenings after you've "worked" for a good amount of time during the day.

## 2015-04-14 NOTE — Therapy (Signed)
[facility] [address] [place], [place], [address] Phone: [phone]   Fax:  [phone]  Speech Language Pathology Treatment  Patient Details  Name: Phillip Curry MRN: [id number] Date of Birth: 11/10/1955 No Data Recorded  Encounter Date: 04/14/2015      End of Session - 04/14/15 1023    Visit Number 33   Number of Visits 35   Date for SLP Re-Evaluation 03/30/15   SLP Start Time 0850   SLP Stop Time  0932   SLP Time Calculation (min) 42 min   Activity Tolerance Patient tolerated treatment well      Past Medical History  Diagnosis Date  . Hypertension   . Stroke [facility])     Past Surgical History  Procedure Laterality Date  . No past surgeries      There were no vitals filed for this visit.  Visit Diagnosis: Cognitive communication deficit      Subjective Assessment - 04/14/15 0859    Subjective "Yes, this is my last visit."               ADULT SLP TREATMENT - 04/14/15 0905    General Information   Behavior/Cognition Pleasant mood;Cooperative;Requires cueing;Alert   Treatment Provided   Treatment provided Cognitive-Linquistic   Pain Assessment   Pain Assessment 0-10   Pain Score 3    Pain Location rt shoulder   Pain Descriptors / Indicators Aching   Pain Intervention(s) Monitored during session   Cognitive-Linquistic Treatment   Treatment focused on Cognition   Skilled Treatment Pt completed homework - executive function - pt needed to rethink start time with dinner originally at 9pm. Pt req'd total A to think about adjustments to make for food/drink with earlier start time and some dollar amounts for additional items, demo'ing reduced emergent and anticipatory awareness, and executive function skills. 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   Time 1   Period Weeks   Status Achieved          SLP Long Term Goals - 04/14/15 1025    SLP LONG TERM GOAL #1   Title Pt will be 90% intelligible in moderately complex conversation over 12 minutes   Time 1   Period Weeks   Status Achieved   SLP LONG TERM GOAL #2   Title Pt will solve simple time, money reasoning problems with 80% accuracy and occasional min A   Time 4   Period Weeks  visits   Status  Not Met   SLP LONG TERM GOAL #3   Title Pt will utilize compensations for schedule, daily chores, lists 2x during therapy session over 2 sessions with rare min A   Status Achieved   SLP LONG TERM GOAL #4   Title pt will demo emergent awareness in cognitive linguistic tasks with mod A occasionally   Time 4   Period --  visits   Status Not Met   SLP LONG TERM GOAL #5   Title Pt will solve simple reasoning and organization cogntive linguistic problems with 80% accuracy and occasinal min A   Time 4   Period --  visits   Status Not Met          Plan - 04/14/15 1024    Clinical Impression Statement Pt's progress has remained static for approx 3 weeks, indicating pt has reached his current rehab potential. Discussed with wife/pt that should they see an improvement in pt's cognitive-linguistics to return to therapy  with a prescription. Discharge will occur today.   Consulted and Agree with Plan of Care Patient;Family member/caregiver     SPEECH THERAPY DISCHARGE SUMMARY  Visits from Start of Care: 7  Current functional level related to goals / functional outcomes: Pt has seem improvement over the entire treatment course - see last renewal/recert for details to that date.  Currently pt is able to solve simple and uncomplicated (single to double step) cognitive-communication scenarios with assistance from SLP. He intermittently recognized errors in his work. Pt's attention level is at alternating level, but that also requires SLP A at times.    Remaining deficits: Pt cont to demo mod cognitive-communicative deficits mostly in attention, awareness, problem solving, and reasoning.   Education / Equipment: Home tasks, compensations for cognitive deficits  Plan: Patient agrees to discharge.  Patient goals were partially met. Patient is being discharged due to lack of progress.  ?????        Problem List Patient Active Problem List   Diagnosis Date Noted  . Essential hypertension 01/23/2015  . Hyperlipidemia 01/23/2015  . OSA (obstructive sleep apnea) 01/23/2015  . HLD (hyperlipidemia)   . CVA (cerebral infarction) 11/23/2014  . Stroke ([place]) 11/23/2014  . Hypertension 11/23/2014    [facility] 04/14/2015, 10:26 AM  [facility] [address] [place], [place], [address] Phone: [phone]   Fax:  [phone]   Name: Phillip Curry MRN: [id number] Date of Birth: 1955/07/22

## 2015-04-18 ENCOUNTER — Telehealth: Payer: Self-pay | Admitting: Neurology

## 2015-04-18 ENCOUNTER — Ambulatory Visit: Payer: BLUE CROSS/BLUE SHIELD | Admitting: Speech Pathology

## 2015-04-18 DIAGNOSIS — G4733 Obstructive sleep apnea (adult) (pediatric): Secondary | ICD-10-CM

## 2015-04-18 NOTE — Telephone Encounter (Signed)
Patient referred by Dr. Erlinda Hong, seen by me on 02/16/15, HST on 03/21/15, CPAP study on 04/12/15, ins: [employer].  Please call and inform patient that I have entered an order for treatment with positive airway pressure (PAP) treatment of obstructive sleep apnea (OSA). He did well during the latest sleep study with CPAP. We will, therefore, arrange for a machine for home use through a DME (durable medical equipment) company of His choice; and I will see the patient back in follow-up in about 8-10 weeks. Please also explain to the patient that I will be looking out for compliance data, which can be downloaded from the machine (stored on an SD card, that is inserted in the machine) or via remote access through a modem, that is built into the machine. At the time of the followup appointment we will discuss sleep study results and how it is going with PAP treatment at home. Please advise patient to bring His machine at the time of the first FU visit, even though this is cumbersome. Bringing the machine for every visit after that will likely not be needed, but often helps for the first visit to troubleshoot if needed. Please re-enforce the importance of compliance with treatment and the need for [place] to monitor compliance data - often an insurance requirement and actually good feedback for the patient as far as how they are doing.  Also remind patient, that any interim PAP machine or mask issues should be first addressed with the DME company, as they can often help better with technical and mask fit issues. Please ask if patient has a preference regarding DME company.  Please also make sure, the patient has a follow-up appointment with me in about 8-10 weeks from the setup date, thanks.  Once you have spoken to the patient - and faxed/routed report to PCP and referring MD (if other than PCP), you can close this encounter, thanks,   Star Age, MD, PhD [facility] ([place])

## 2015-04-25 NOTE — Telephone Encounter (Signed)
Left message for patient to call back  

## 2015-04-26 NOTE — Telephone Encounter (Signed)
Pt returned Diana's call °

## 2015-04-27 ENCOUNTER — Encounter: Payer: BLUE CROSS/BLUE SHIELD | Admitting: Speech Pathology

## 2015-04-27 NOTE — Telephone Encounter (Signed)
I spoke to patient and his wife. They are willing to proceed with treatment. I will refer to [facility]. I will send report to PCP. And will send the patient a letter reminding him to make appt and stress the importance of compliance.

## 2015-05-01 ENCOUNTER — Ambulatory Visit: Payer: BLUE CROSS/BLUE SHIELD | Admitting: Neurology

## 2015-05-03 ENCOUNTER — Ambulatory Visit: Payer: BLUE CROSS/BLUE SHIELD | Admitting: Neurology

## 2015-05-04 ENCOUNTER — Encounter: Payer: Self-pay | Admitting: Neurology

## 2015-05-10 ENCOUNTER — Ambulatory Visit (INDEPENDENT_AMBULATORY_CARE_PROVIDER_SITE_OTHER): Payer: BLUE CROSS/BLUE SHIELD | Admitting: Nurse Practitioner

## 2015-05-10 ENCOUNTER — Encounter: Payer: Self-pay | Admitting: Nurse Practitioner

## 2015-05-10 ENCOUNTER — Telehealth: Payer: Self-pay

## 2015-05-10 VITALS — BP 132/74 | HR 89 | Ht 69.0 in | Wt 190.0 lb

## 2015-05-10 DIAGNOSIS — G4733 Obstructive sleep apnea (adult) (pediatric): Secondary | ICD-10-CM

## 2015-05-10 DIAGNOSIS — E785 Hyperlipidemia, unspecified: Secondary | ICD-10-CM

## 2015-05-10 DIAGNOSIS — R269 Unspecified abnormalities of gait and mobility: Secondary | ICD-10-CM

## 2015-05-10 DIAGNOSIS — I63519 Cerebral infarction due to unspecified occlusion or stenosis of unspecified middle cerebral artery: Secondary | ICD-10-CM

## 2015-05-10 NOTE — Telephone Encounter (Signed)
Patient, at appt, asked about getting set up for CPAP states that he has not heard from [employer]. I contacted Heather with [employer]. She advised me that she had left a message for the patient on 05/03/15. She emailed me again this afternoon saying that she left message on both contact numbers. We confirmed that she has the correct numbers.

## 2015-05-10 NOTE — Patient Instructions (Signed)
To prevent recurrent stroke management of risk factors essential Continue ASA and lipitor for stroke prevention Follow up with your primary care physician for stroke risk factor modification. Recommend maintain blood pressure goal <130/80,todays reading 134/74  hemoglobin A1c goal below 6.5% last was 5.2 lipids with LDL cholesterol goal below 70 mg/dL. Last 145 Check BP at home at least weekly Sleep study positive for sleep apnea, Dr. Guadelupe Sabin nurse to check with equipment company and they will be calling you "[facility]" RTC in 6 months.

## 2015-05-10 NOTE — Progress Notes (Signed)
[name] DOB: 1956/04/03   REASON FOR VISIT: Follow-up for stroke, hypertension, hyperlipidemia and obstructive sleep apnea, gait abnormality HISTORY FROM: Patient    HISTORY OF PRESENT ILLNESS: HISTORY: [name] is a 60 y.o. male with history of HTN was admitted on 11/23/14 for right arm weakness, speech difficulties, and right facial droop. MRI showed left basal ganglia and corona radiata lacunar infarct, likely due to small vessel disease. MRA unremarkable. Carotid Doppler and 2-D echo negative. LDL 145, and A1c 5.2. He was discharged on aspirin 325 and Lipitor 40 with outpatient PT/OT.  UPDATE 13/3/16JXDuring the interval time, the patient has been doing well. No recurrent strokelike symptoms. He has PT/OT twice every week.  On valsartan and HCTZ for blood pressure, today 115/71. Wife confirms he has snoring and intermittent apnea during sleep, however better after stroke. He is continued on aspirin and Lipitor without side effect.  UPDATE 05/10/2015 Phillip Curry, 60 year old male returns for follow-up. He has a history of left basal ganglia and lacunar infarct likely due to small vessel disease. He is currently on aspirin 325 and Lipitor 40 mg for secondary stroke prevention. He has not had further stroke or TIA symptoms. He has been diagnosed with obstructive sleep apnea and the equipment company called however he has not received the CPAP equipment His physical therapy compleated about a month ago and he is performing his home exercise program. He returns for reevaluation  REVIEW OF SYSTEMS: Full 14 system review of systems performed and notable only for those listed, all others are neg:  Constitutional: neg  Cardiovascular: neg Ear/Nose/Throat: neg  Skin: neg Eyes: neg Respiratory: neg Gastroitestinal: neg  Hematology/Lymphatic: neg  Endocrine: neg Musculoskeletal:neg Allergy/Immunology: neg Neurological: neg Psychiatric:  neg Sleep : neg   ALLERGIES: No Known Allergies  HOME MEDICATIONS: Outpatient Prescriptions Prior to Visit  Medication Sig Dispense Refill  . aspirin 325 MG tablet Take 1 tablet (325 mg total) by mouth daily.    Marland Kitchen atorvastatin (LIPITOR) 40 MG tablet Take 1 tablet (40 mg total) by mouth daily at 6 PM. 30 tablet 1  . Multiple Vitamin (MULTIVITAMIN WITH MINERALS) TABS tablet Take 1 tablet by mouth daily.    . Probiotic Product (PROBIOTIC DAILY PO) Take by mouth.    Marland Kitchen UNABLE TO FIND Colon clenz (stool softner supp).    . valsartan-hydrochlorothiazide (DIOVAN-HCT) 160-12.5 MG per tablet Take 1 tablet by mouth daily.     No facility-administered medications prior to visit.    PAST MEDICAL HISTORY: Past Medical History  Diagnosis Date  . Hypertension   . Stroke [facility])     PAST SURGICAL HISTORY: Past Surgical History  Procedure Laterality Date  . No past surgeries      FAMILY HISTORY: Family History  Problem Relation Age of Onset  . Hypertension Mother   . Pancreatic cancer Mother   . Stroke Maternal Aunt   . Lung cancer Father   . Cancer Paternal Grandmother   . Emphysema Paternal Grandfather     SOCIAL HISTORY: Social History   Social History  . Marital Status: Married    Spouse Name: N/A  . Number of Children: 0  . Years of Education: 12   Occupational History  . Not on file.   Social History Main Topics  . Smoking status: Never Smoker   . Smokeless tobacco: Not on file  . Alcohol Use: No  . Drug Use: No  . Sexual Activity: Not on file  Other Topics Concern  . Not on file   Social History Narrative   8oz of caffeine beverage a day      PHYSICAL EXAM  Filed Vitals:   05/10/15 1051 05/10/15 1103  BP: 139/81 132/74  Pulse: 89   Height: 5\' 9"  (1.753 m)   Weight: 190 lb (86.183 kg)    Body mass index is 28.05 kg/(m^2). General - Well nourished, well developed, in no apparent distress. Cardiovascular - Regular rate and rhythm. Neck supple  without carotid bruits  Mental Status - awake alert and appropriate Language including expression, naming, repetition, comprehension was assessed and found intact, without dysarthria Fund of Knowledge was assessed and was intact.  Cranial Nerves II - XII - II - Visual field intact OU. Sharp disc margins OU.. III, IV, VI - Extraocular movements intact. V - Facial sensation intact bilaterally. VII - mild right facial droop. VIII - Hearing & vestibular intact bilaterally. X - Palate elevates symmetrically,  XI - Chin turning & shoulder shrug intact bilaterally. XII - Tongue protrusion intact.  Motor Strength - The patient's strength was normal in RUE and RLE, 4/5 LUE and 5-/5 LLE and pronator drift was absent Bulk and tone are normal  Reflexes - The patient's reflexes were 1+ in all extremities and he had no pathological reflexes. Sensory - Light touch, temperature/pinprick were assessed and were normal.  Coordination - The patient had normal movements in the hands and feet with no ataxia or dysmetria. Tremor was absent. Gait and Station - subtle hemiplegic gait on the left, decreased arm swing on the left.  DIAGNOSTIC DATA (LABS, IMAGING, TESTING) - I reviewed patient records, labs, notes, testing and imaging myself where available.  Lab Results  Component Value Date   WBC 6.3 11/24/2014   HGB 12.9* 11/24/2014   HCT 39.1 11/24/2014   MCV 99.7 11/24/2014   PLT 243 11/24/2014      Component Value Date/Time   NA 140 11/24/2014 0450   K 4.2 11/24/2014 0450   CL 106 11/24/2014 0450   CO2 29 11/24/2014 0450   GLUCOSE 104* 11/24/2014 0450   BUN 7 11/24/2014 0450   CREATININE 0.97 11/24/2014 0450   CALCIUM 9.3 11/24/2014 0450   PROT 6.8 11/24/2014 0450   ALBUMIN 3.6 11/24/2014 0450   AST 20 11/24/2014 0450   ALT 17 11/24/2014 0450   ALKPHOS 42 11/24/2014 0450   BILITOT 0.8 11/24/2014 0450   GFRNONAA >60 11/24/2014 0450   GFRAA >60 11/24/2014 0450   Lab Results  Component  Value Date   CHOL 218* 11/24/2014   HDL 62 11/24/2014   LDLCALC 145* 11/24/2014   TRIG 57 11/24/2014   CHOLHDL 3.5 11/24/2014   Lab Results  Component Value Date   HGBA1C 5.2 11/24/2014    ASSESSMENT AND PLAN 60 y.o. African American male with PMH of HTN was admitted on 11/23/14 for left basal ganglia and corona radiata lacunar infarct on MRI, likely due to small vessel disease. MRA, Carotid Doppler and 2-D echo negative. LDL 145, and A1c 5.2. He was discharged on aspirin 325 and Lipitor 40  No further stroke or TIA symptoms .Obstructive sleep apnea with CPAP ordered not delivered.The  patient is a current patient of Dr. Erlinda Hong  who is out of the office today . This note is sent to the work in doctor.      PLAN  To prevent recurrent stroke, management of risk factors essential Continue ASA and lipitor for stroke prevention Follow up with  your primary care physician for stroke risk factor modification. Recommend maintain blood pressure goal <130/80,todays reading 134/74  hemoglobin A1c goal below 6.5% last was 5.2 lipids with LDL cholesterol goal below 70 mg/dL. Last 145 Check BP at home at least weekly Sleep study positive for sleep apnea, Dr. Guadelupe Sabin nurse to check with equipment company and they will be calling you "[employer]" RTC in 6 months.Vst time 25 min Dennie Bible, [facility], [facility], APRN  [facility] [address], [place], [place] [address] [phone]  he

## 2015-05-16 NOTE — Progress Notes (Signed)
I reviewed note and agree with plan.   Levi Crass R. Cecily Lawhorne, MD  Certified in Neurology, Neurophysiology and Neuroimaging  [facility] [address], [address] [place], [place] [address] [phone]   

## 2015-06-13 ENCOUNTER — Telehealth: Payer: Self-pay | Admitting: *Deleted

## 2015-06-13 NOTE — Telephone Encounter (Signed)
Form,[employer] sent to [place] and Hoyle Sauer 06/13/15.

## 2015-06-13 NOTE — Telephone Encounter (Signed)
Form,[employer] want go to [place] and Hoyle Sauer it goes to a Doctor.

## 2015-06-28 ENCOUNTER — Telehealth: Payer: Self-pay

## 2015-06-28 NOTE — Telephone Encounter (Signed)
LFT vm for patient about the long term disability form. [employer] has never filled out any disability form for patient since his first visit. Pt was seen by Dr. Erlinda Hong in October 2016 and January 2017 by Carolyn(NP). Per Dr. Erlinda Hong the following questions to be answer by the patient  are 1. What kind of work does the patient do, 2. Who did the original disability form, 3.Pt was seen in 01/2015 and 04/2015 and the examination did not show any residual deficits. 4.What kind of deficits are limiting his work ability.

## 2015-07-05 NOTE — Telephone Encounter (Signed)
Pt called to answer questions- 1: worked for [employer], 2: He believes it was [employer]. 3: ---- 4: Right arm is still weak, he can not do any heavy lifting and is still doing exercises at home. He completed all pt visits. Please call and advise [phone]

## 2015-07-11 NOTE — Telephone Encounter (Addendum)
Rn call patient and talk to his wife [name]. PTs wife stated that patients PCP Dr.Gates did the original disability form. Rn stated Dr.Xu did not state patient was disable. RN stated the form was discuss with Dr Erlinda Hong and he suggested thatDr. Inda Merlin should continue doing the long term disability form. Pts wife will call [employer] about this.

## 2015-07-12 ENCOUNTER — Ambulatory Visit: Payer: BLUE CROSS/BLUE SHIELD | Admitting: Neurology

## 2015-07-27 ENCOUNTER — Telehealth: Payer: Self-pay | Admitting: Neurology

## 2015-07-27 NOTE — Telephone Encounter (Signed)
Called patient and left a VM informing him he needed to pay the 25 dollar fee for disability paperwork. Filled out papers and placed with Dr. Phoebe Sharps nurse Katrina RN.

## 2015-07-27 NOTE — Telephone Encounter (Signed)
Form given back to [place] in medical records.See phone note 06/28/2015 about form. Pt needs to give form to his PCP to filled out. Dr. Erlinda Hong did not put the patient on disability his PCP did. See phone notes from Rn. The disability company was fax office notes per their request.

## 2015-08-02 ENCOUNTER — Telehealth: Payer: Self-pay

## 2015-08-02 NOTE — Telephone Encounter (Signed)
Rn receive a form from [employer] about patients work restrictions. Dr. Erlinda Hong stated patient needed to have form filled out by his PCP. Patients PCP did filled form out. See previous phone messages. left for [employer] to call back for clarification.

## 2015-08-04 NOTE — Telephone Encounter (Signed)
LFt vm for [name] at [facility] about form that was fax to Dr. Erlinda Hong. Pts PCP has been filling out his disability form. Rn receive another copy of disability form stating Dr.Gates has stated patient unable to work. LFt vm for Colletta [place] to call on Monday the claims specialist.

## 2015-08-07 NOTE — Telephone Encounter (Signed)
Rn tried Armed forces training and education officer for patient at [employer]. Rn got [place] vm again.Rn call back and talk to representative about form. Rn stated did not do original disability form the patients PCP did. The representative stated they wanted medical records. Rn stated a request can be sent to medical records for patient. Rn left number with rep for Col[place]ta [place] to call back again to [facility] for clarification of what [employer] needs from Dr. Erlinda Hong.

## 2015-08-08 NOTE — Telephone Encounter (Signed)
Fax medical records to [place] per [facility] request.

## 2015-08-08 NOTE — Telephone Encounter (Signed)
[name]/[employer] returned Phillip Curry's call/ Please call [phone]. She did get message has gone to Dr. Inda Merlin for information and appreciates Phillip Curry's call. Will request patient's records.

## 2015-11-07 ENCOUNTER — Encounter: Payer: Self-pay | Admitting: Nurse Practitioner

## 2015-11-07 ENCOUNTER — Ambulatory Visit (INDEPENDENT_AMBULATORY_CARE_PROVIDER_SITE_OTHER): Payer: BLUE CROSS/BLUE SHIELD | Admitting: Nurse Practitioner

## 2015-11-07 VITALS — BP 129/70 | HR 76 | Ht 69.0 in | Wt 192.2 lb

## 2015-11-07 DIAGNOSIS — I10 Essential (primary) hypertension: Secondary | ICD-10-CM | POA: Diagnosis not present

## 2015-11-07 DIAGNOSIS — R269 Unspecified abnormalities of gait and mobility: Secondary | ICD-10-CM

## 2015-11-07 DIAGNOSIS — G4733 Obstructive sleep apnea (adult) (pediatric): Secondary | ICD-10-CM

## 2015-11-07 DIAGNOSIS — E785 Hyperlipidemia, unspecified: Secondary | ICD-10-CM | POA: Diagnosis not present

## 2015-11-07 DIAGNOSIS — I63519 Cerebral infarction due to unspecified occlusion or stenosis of unspecified middle cerebral artery: Secondary | ICD-10-CM

## 2015-11-07 NOTE — Patient Instructions (Signed)
Continue ASA and lipitor for stroke prevention Follow up with your primary care physician for stroke risk factor modification. Recommend maintain blood pressure goal <130/80,todays reading 129/70 hemoglobin A1c goal below 6.5% last was 5.2 lipids with LDL cholesterol goal below 70 mg/dL. Continue Lipitor Check BP at home at least weekly Refused CPAP RTC in 1 year.

## 2015-11-07 NOTE — Progress Notes (Signed)
[name] DOB: [birth year]   REASON FOR VISIT: Follow-up for cerebrovascular accident, history of obstructive sleep apnea, hyperlipidemia abnormality of gait HISTORY FROM: Patient    HISTORY OF PRESENT ILLNESS:Dr XUMr. Phillip Curry is a 60 y.o. male with history of HTN was admitted on 11/23/14 for right arm weakness, speech difficulties, and right facial droop. MRI showed left basal ganglia and corona radiata lacunar infarct, likely due to small vessel disease. MRA unremarkable. Carotid Doppler and 2-D echo negative. LDL 145, and A1c 5.2. He was discharged on aspirin 325 and Lipitor 40 with outpatient PT/OT.  UPDATE 13/3/16JXDuring the interval time, the patient has been doing well. No recurrent strokelike symptoms. He has PT/OT twice every week. On valsartan and HCTZ for blood pressure, today 115/71. Wife confirms he has snoring and intermittent apnea during sleep, however better after stroke. He is continued on aspirin and Lipitor without side effect.  UPDATE 05/10/2015 CMMr. Phillip Curry, 60 year old male returns for follow-up. He has a history of left basal ganglia and lacunar infarct likely due to small vessel disease. He is currently on aspirin 325 and Lipitor 40 mg for secondary stroke prevention. He has not had further stroke or TIA symptoms. He has been diagnosed with obstructive sleep apnea and the equipment company called however he has not received the CPAP equipment His physical therapy compleated about a month ago and he is performing his home exercise program. He returns for reevaluation UPDATE 07/18/2017CM Mr. Phillip Curry 60 year old male returns for follow-up. He has a history of left basal ganglia and corona radiata lacunar infarct likely due to small vessel disease on 11/23/2014. He is currently on aspirin and Lipitor for secondary stroke prevention without further stroke or TIA symptoms sleep test was positive for obstructive sleep apnea however  patient has refused CPAP. His labs are followed through his primary care Dr. Inda Curry. He is continuing to do home exercise program. He returns for reevaluation. He has applied for disability  REVIEW OF SYSTEMS: Full 14 system review of systems performed and notable only for those listed, all others are neg:  Constitutional: neg  Cardiovascular: neg Ear/Nose/Throat: neg  Skin: neg Eyes: neg Respiratory: neg Gastroitestinal: neg  Hematology/Lymphatic: neg  Endocrine: Intolerance to cold Musculoskeletal: Walking difficulty Allergy/Immunology: neg Neurological: neg Psychiatric: neg Sleep : neg   ALLERGIES: No Known Allergies  HOME MEDICATIONS: Outpatient Prescriptions Prior to Visit  Medication Sig Dispense Refill  . aspirin 325 MG tablet Take 1 tablet (325 mg total) by mouth daily.    Marland Kitchen atorvastatin (LIPITOR) 40 MG tablet Take 1 tablet (40 mg total) by mouth daily at 6 PM. 30 tablet 1  . Multiple Vitamin (MULTIVITAMIN WITH MINERALS) TABS tablet Take 1 tablet by mouth daily.    . Probiotic Product (PROBIOTIC DAILY PO) Take by mouth.    . valsartan-hydrochlorothiazide (DIOVAN-HCT) 160-12.5 MG per tablet Take 1 tablet by mouth daily.    Marland Kitchen UNABLE TO FIND Colon clenz (stool softner supp).     No facility-administered medications prior to visit.    PAST MEDICAL HISTORY: Past Medical History  Diagnosis Date  . Hypertension   . Stroke [facility])     PAST SURGICAL HISTORY: Past Surgical History  Procedure Laterality Date  . No past surgeries      FAMILY HISTORY: Family History  Problem Relation Age of Onset  . Hypertension Mother   . Pancreatic cancer Mother   . Stroke Maternal Aunt   . Lung cancer Father   . Cancer  Paternal Grandmother   . Emphysema Paternal Grandfather     SOCIAL HISTORY: Social History   Social History  . Marital Status: Married    Spouse Name: N/A  . Number of Children: 0  . Years of Education: 12   Occupational History  . Not on file.   Social  History Main Topics  . Smoking status: Never Smoker   . Smokeless tobacco: Not on file  . Alcohol Use: No  . Drug Use: No  . Sexual Activity: Not on file   Other Topics Concern  . Not on file   Social History Narrative   8oz of caffeine beverage a day      PHYSICAL EXAM  Filed Vitals:   11/07/15 1017  BP: 129/70  Pulse: 76  Height: 5\' 9"  (1.753 m)  Weight: 192 lb 3.2 oz (87.181 kg)   Body mass index is 28.37 kg/(m^2). General - Well nourished, well developed, in no apparent distress. Cardiovascular - Regular rate and rhythm. Neck supple without carotid bruits  Mental Status - awake alert and appropriate Language including expression, naming, repetition, comprehension was assessed and found intact, without dysarthria Fund of Knowledge was assessed and was intact.  Cranial Nerves II - XII - II - Visual field intact OU. Sharp disc margins OU.. III, IV, VI - Extraocular movements intact. V - Facial sensation intact bilaterally. VII - mild right facial droop. VIII - Hearing & vestibular intact bilaterally. X - Palate elevates symmetrically,  XI - Chin turning & shoulder shrug intact bilaterally. XII - Tongue protrusion intact.  Motor Strength - The patient's strength was normal in LUE and LLE.  4/5 RUEand 5-/5 LLE and pronator drift was absent Bulk and tone are normal . Decreased grip strength in right hand versus left. Reflexes - The patient's reflexes were 1+ in all extremities and he had no pathological reflexes. Sensory - Light touch, temperature/pinprick were assessed and were normal.  Coordination - The patient had normal movements in the hands and feet with no ataxia or dysmetria. Tremor was absent. Gait and Station - subtle hemiplegic gait on the right decreased arm swing on the right DIAGNOSTIC DATA (LABS, IMAGING, TESTING) - I reviewed patient records, labs, notes, testing and imaging myself where available.  Lab Results  Component Value Date   WBC 6.3  11/24/2014   HGB 12.9* 11/24/2014   HCT 39.1 11/24/2014   MCV 99.7 11/24/2014   PLT 243 11/24/2014      Component Value Date/Time   NA 140 11/24/2014 0450   K 4.2 11/24/2014 0450   CL 106 11/24/2014 0450   CO2 29 11/24/2014 0450   GLUCOSE 104* 11/24/2014 0450   BUN 7 11/24/2014 0450   CREATININE 0.97 11/24/2014 0450   CALCIUM 9.3 11/24/2014 0450   PROT 6.8 11/24/2014 0450   ALBUMIN 3.6 11/24/2014 0450   AST 20 11/24/2014 0450   ALT 17 11/24/2014 0450   ALKPHOS 42 11/24/2014 0450   BILITOT 0.8 11/24/2014 0450   GFRNONAA >60 11/24/2014 0450   GFRAA >60 11/24/2014 0450   Lab Results  Component Value Date   CHOL 218* 11/24/2014   HDL 62 11/24/2014   LDLCALC 145* 11/24/2014   TRIG 57 11/24/2014   CHOLHDL 3.5 11/24/2014   Lab Results  Component Value Date   HGBA1C 5.2 11/24/2014   ASSESSMENT AND PLAN 60 y.o. African American male with PMH of HTN was admitted on 11/23/14 for left basal ganglia and corona radiata lacunar infarct on MRI, likely due  to small vessel disease. MRA, Carotid Doppler and 2-D echo negative. LDL 145, and A1c 5.2. He was discharged on aspirin 325 and Lipitor 40 No further stroke or TIA symptoms .Obstructive sleep apnea with CPAP ordered  patient refused CPAP The patient is a current patient of Dr. Erlinda Curry who is out of the office today . This note is sent to the work in doctor.   Continue ASA and lipitor for stroke prevention Follow up with your primary care physician for stroke risk factor modification. Recommend maintain blood pressure goal <130/80,todays reading 129/70 hemoglobin A1c goal below 6.5% last was 5.2 lipids with LDL cholesterol goal below 70 mg/dL. Continue Lipitor Check BP at home at least weekly Continue home exercise program Refused CPAP RTC in 1 year. If stable at that time will discharge Dennie Bible, [facility], [facility], [facility] [address] [place], [place] [address] [phone]

## 2015-11-07 NOTE — Progress Notes (Signed)
I have reviewed and agreed above plan. 

## 2015-12-06 ENCOUNTER — Telehealth: Payer: Self-pay

## 2015-12-06 NOTE — Telephone Encounter (Signed)
LFt vm for [employer] the disability specialist at [employer]. Rn stated per Dr. Erlinda Hong, we need a copy of the job duties. Pt stated he still having right hand weakness after a year of having the stroke. The form cannot be filled out until we get more clarification. The form is asking for work restrictions only, and return date to work. Dr. Erlinda Hong needs to know if he can work.

## 2015-12-07 NOTE — Telephone Encounter (Signed)
[name]/[employer] [phone] ext [phone] called to advise, she's faxing patient's job description as we speak to [phone].

## 2015-12-12 NOTE — Telephone Encounter (Deleted)
ggg

## 2015-12-12 NOTE — Telephone Encounter (Addendum)
Form fax to [employer]. Form just ask two questions about pts work restrictions and appts. Pt was not charge. It ask just for appt dates and future dates at [facility].

## 2015-12-12 NOTE — Telephone Encounter (Deleted)
jjj

## 2015-12-15 NOTE — Telephone Encounter (Signed)
Rn spoke with Mitzi Hansen at [facility]. Rn stated the restriction was fax on 12/12/2015. Mitzi Hansen stated that they did not have office notes. Rn stated office notes were fax this morning. Mitzi Hansen stated he did receive the restriction form on 8/23/207.Rn stated we cannot fax hospital notes, because we are outpatient. Rn stated pt has not had any test or labs this year at [facility]. Mitzi Hansen verbalized understanding.

## 2016-06-06 DIAGNOSIS — Z0271 Encounter for disability determination: Secondary | ICD-10-CM

## 2016-09-18 IMAGING — CT CT HEAD W/O CM
2 series · 17 of 30 positions shown, 20 images · non-contrast
Comparison: None.

CLINICAL DATA: Left-sided facial droop and slurred speech.

EXAM:
CT HEAD WITHOUT CONTRAST
TECHNIQUE: Contiguous axial images were obtained from the base of the skull
through the vertex without intravenous contrast.

[Series 2: head w/o · axial · non-contrast · 0.45mm/px · z∈[-70,+50]mm · 9 of 31 slices shown, 12 images]
[im 4/31  brain]
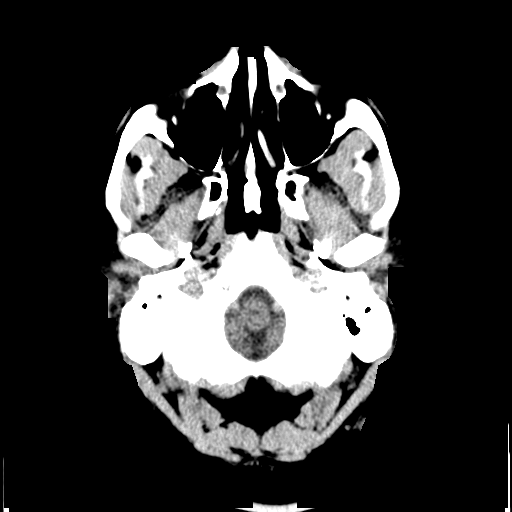
[im 4/31  bone]
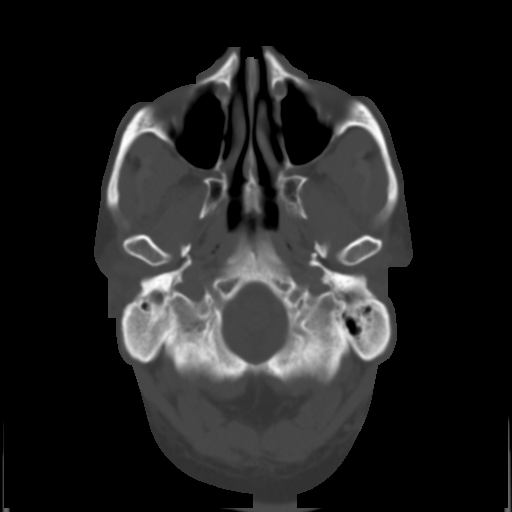
[im 7/31  brain]
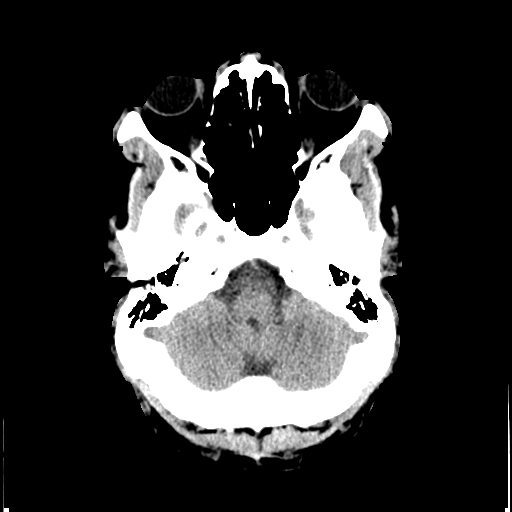
[im 10/31  brain]
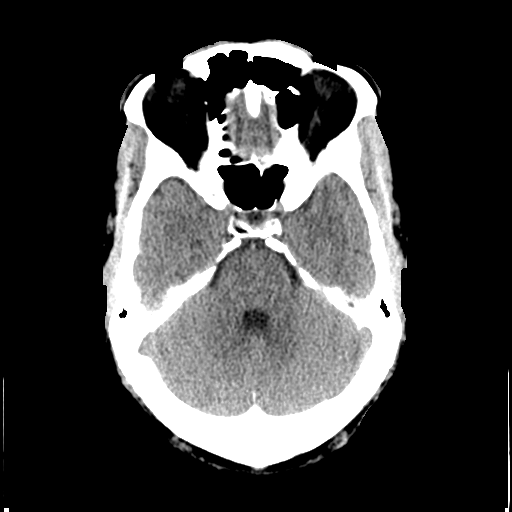
[im 13/31  brain]
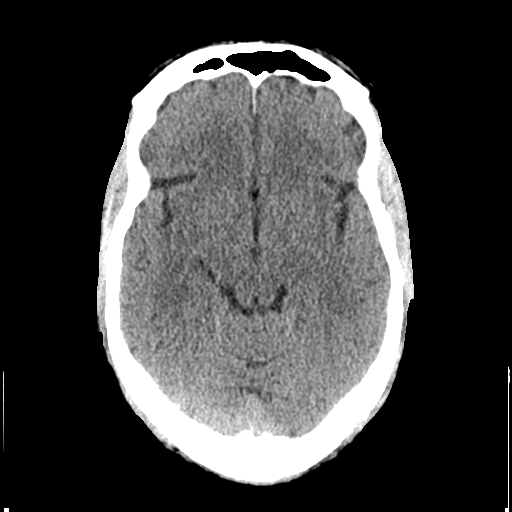
[im 16/31  brain]
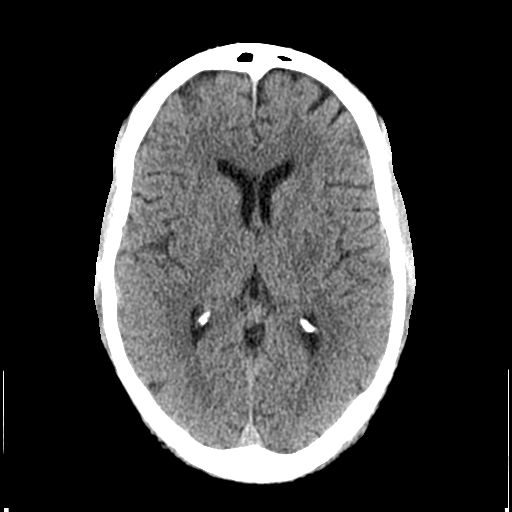
[im 16/31  bone]
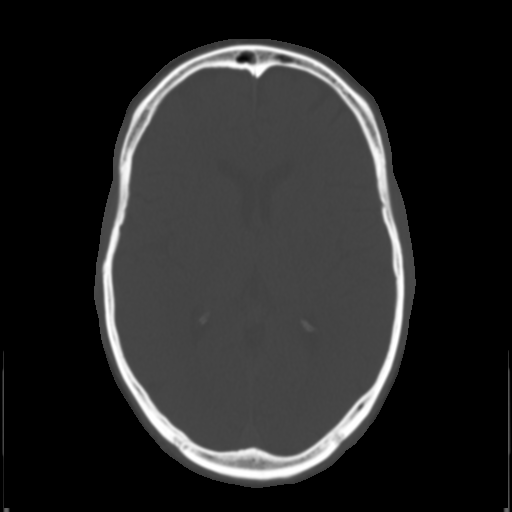
[im 19/31  brain]
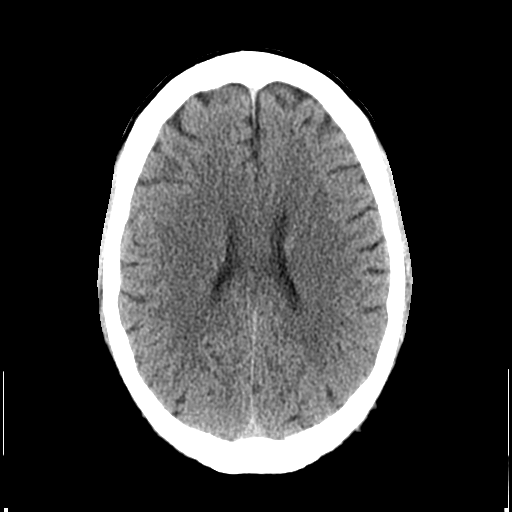
[im 22/31  brain]
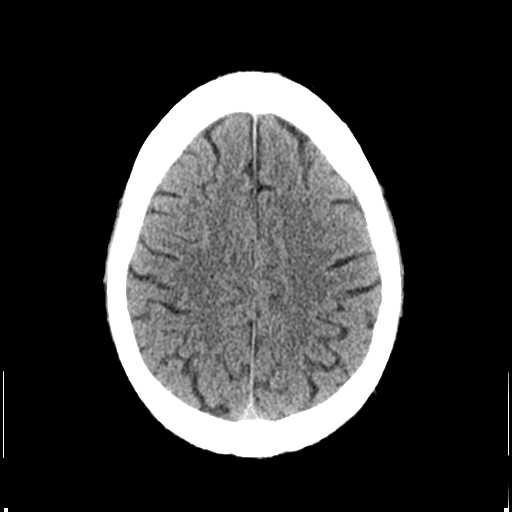
[im 25/31  brain]
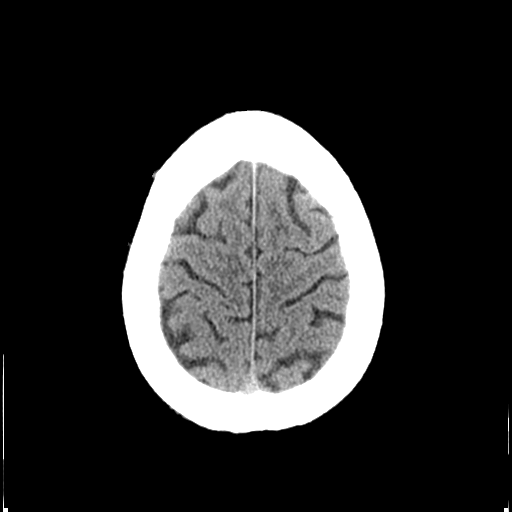
[im 28/31  brain]
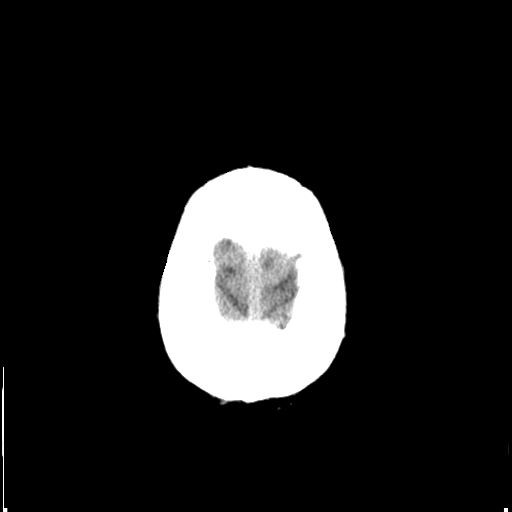
[im 28/31  bone]
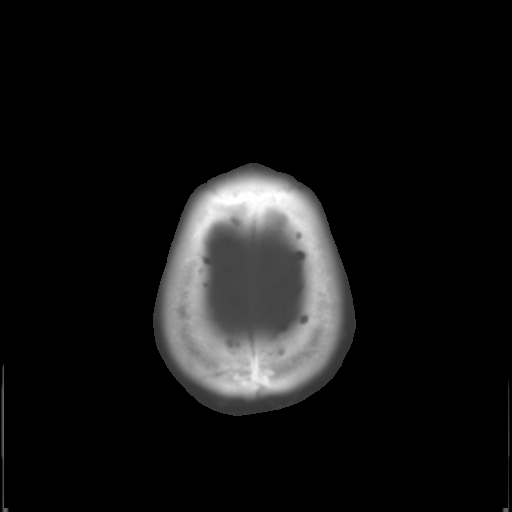

[Series 3: bone windows · axial · 0.45mm/px · z∈[-70,+50]mm · 8 of 52 slices shown]
[im 6/52  bone]
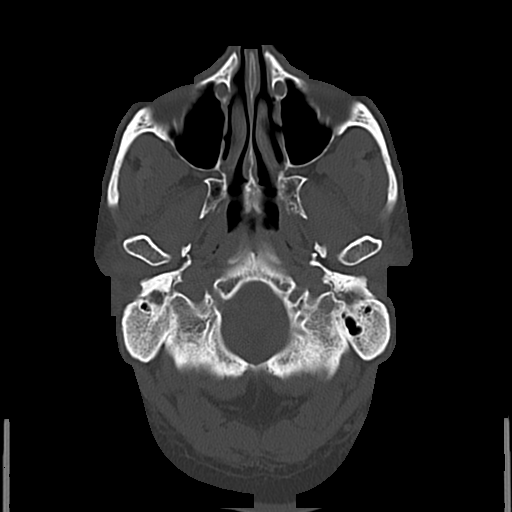
[im 12/52  bone]
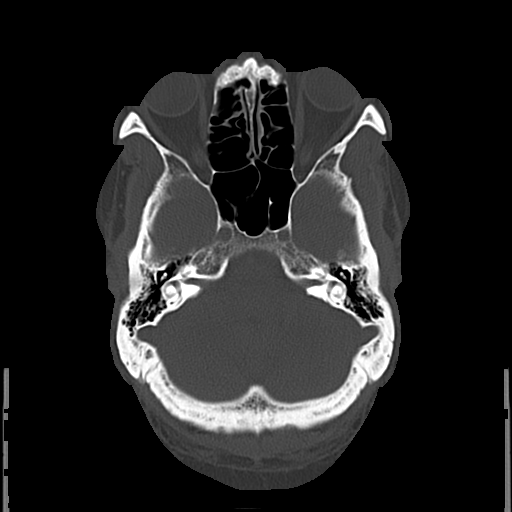
[im 18/52  bone]
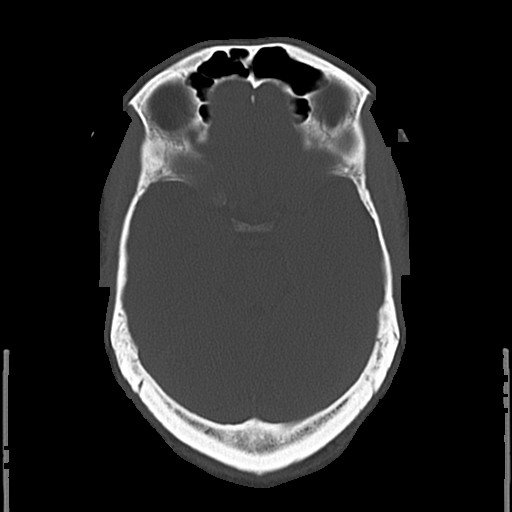
[im 23/52  bone]
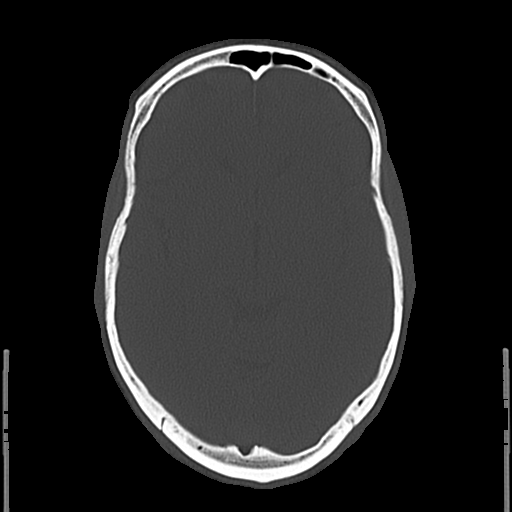
[im 29/52  bone]
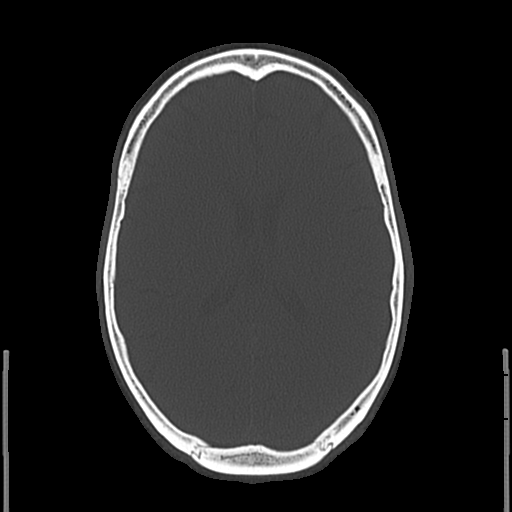
[im 35/52  bone]
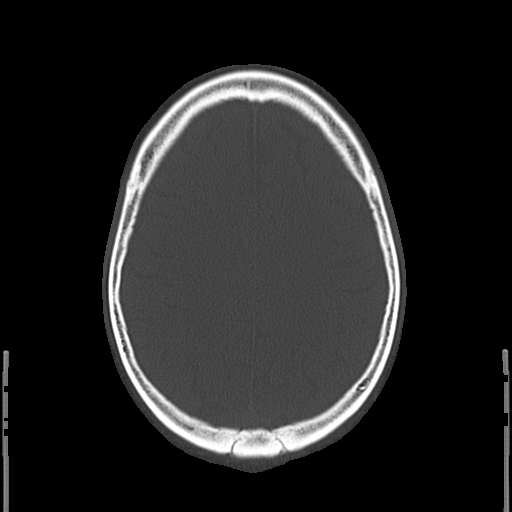
[im 40/52  bone]
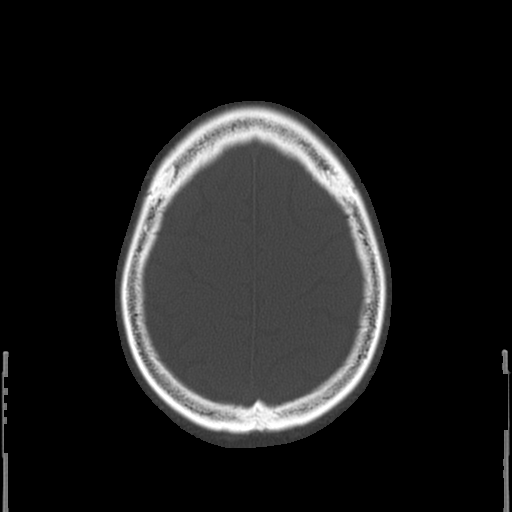
[im 46/52  bone]
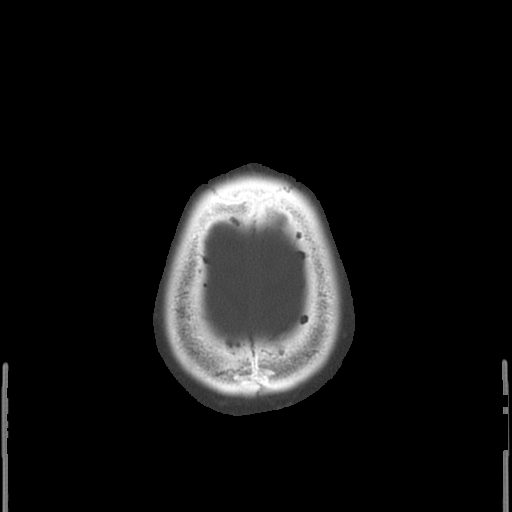

[17 of 30 positions shown; findings below may reference images not displayed]

FINDINGS: There is patchy low attenuation within the left basal ganglia. No
evidence for acute intracranial hemorrhage or significant mass
effect. Ventricles and sulci are appropriate for patient's age.
Orbits are unremarkable. Paranasal sinuses are unremarkable.
Calvarium is intact.
IMPRESSION: Findings compatible with acute infarct within left basal ganglia
region. No intracranial hemorrhage.

These results were called by telephone at the time of interpretation
on 11/23/2014 at [DATE] to Dr. JURICAT DEVCIC , who verbally
acknowledged these results.

## 2016-09-19 IMAGING — CR DG CHEST 2V
2 series · 2 of 2 positions shown · non-contrast
Comparison: None.

CLINICAL DATA: Stroke.  Hypertension.

EXAM:
CHEST - 2 VIEW

[chest pa]
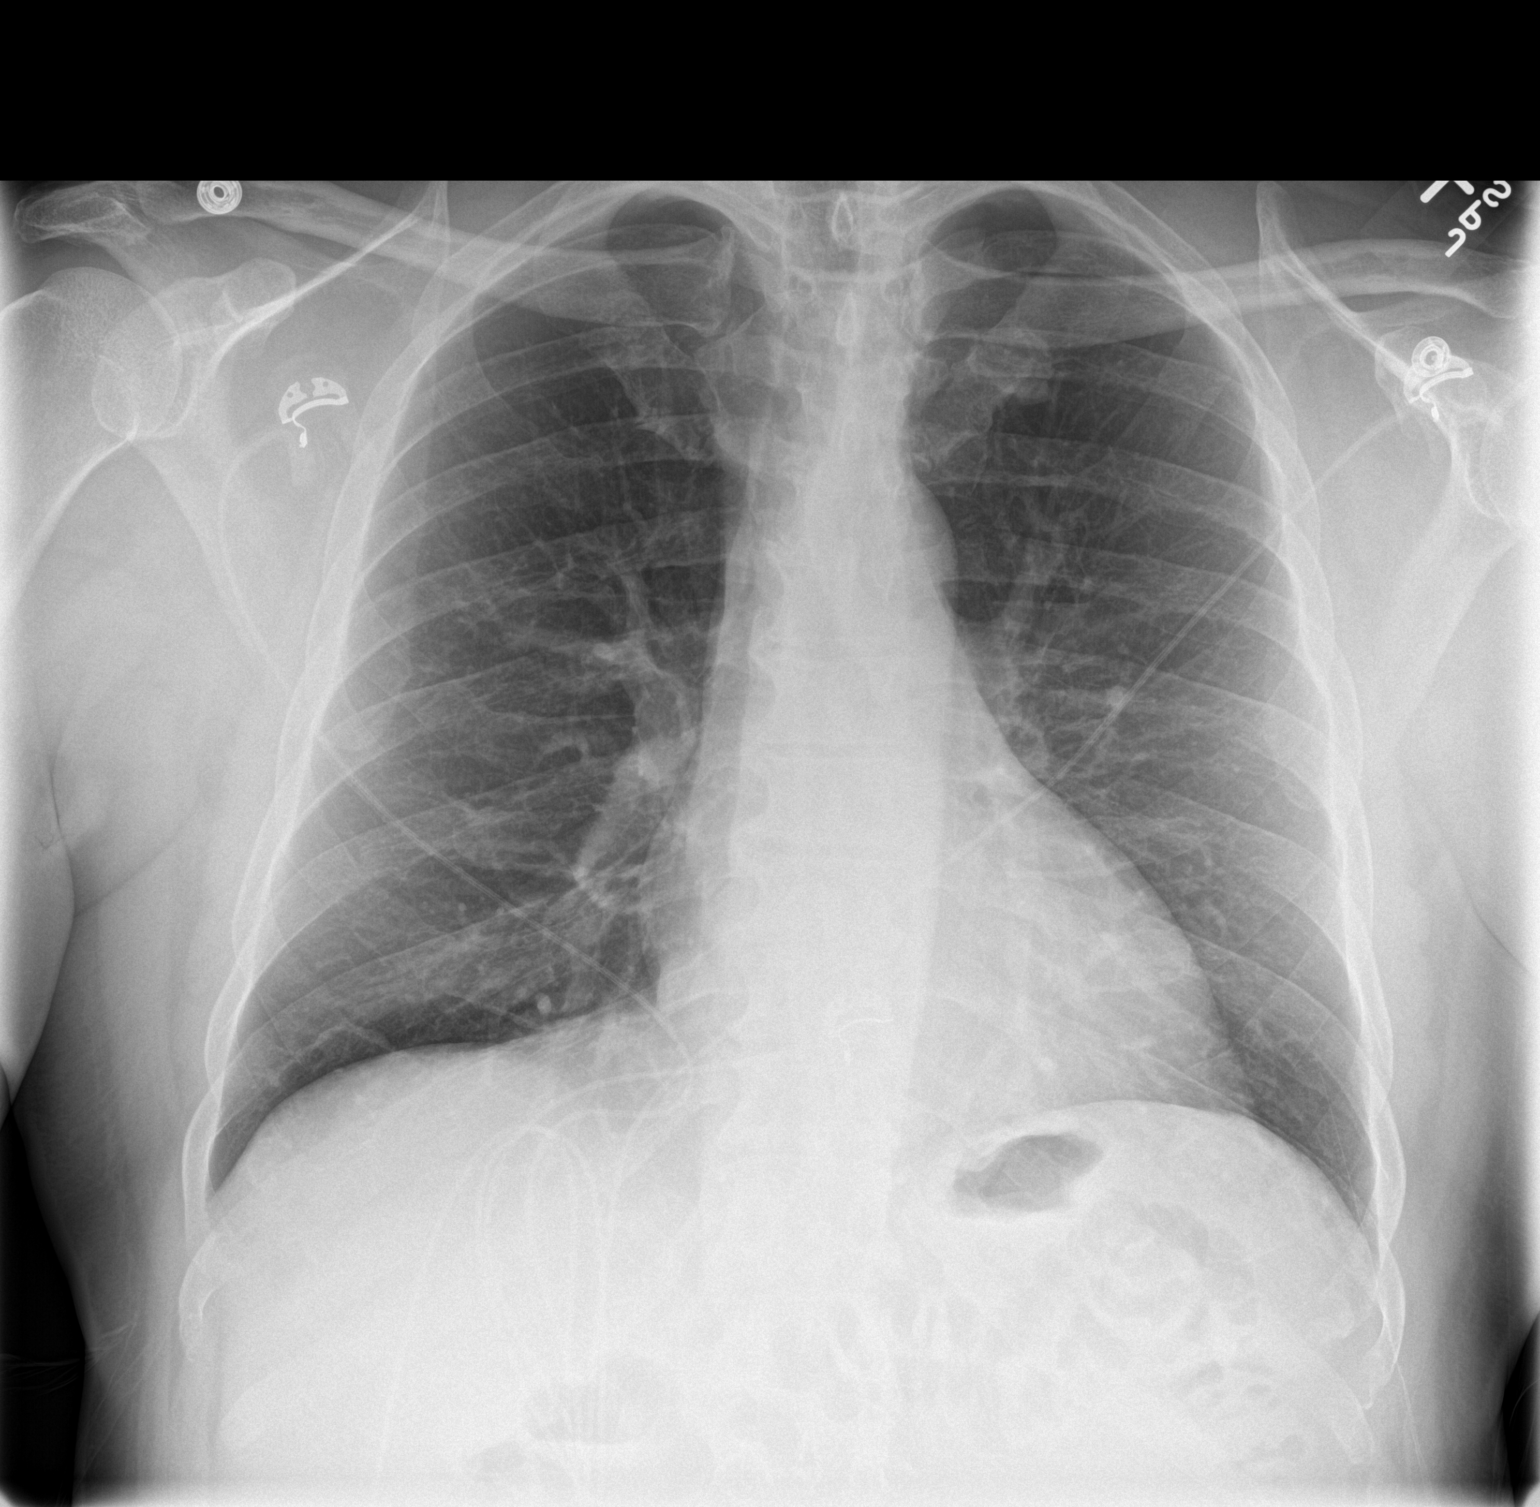

[chest lat]
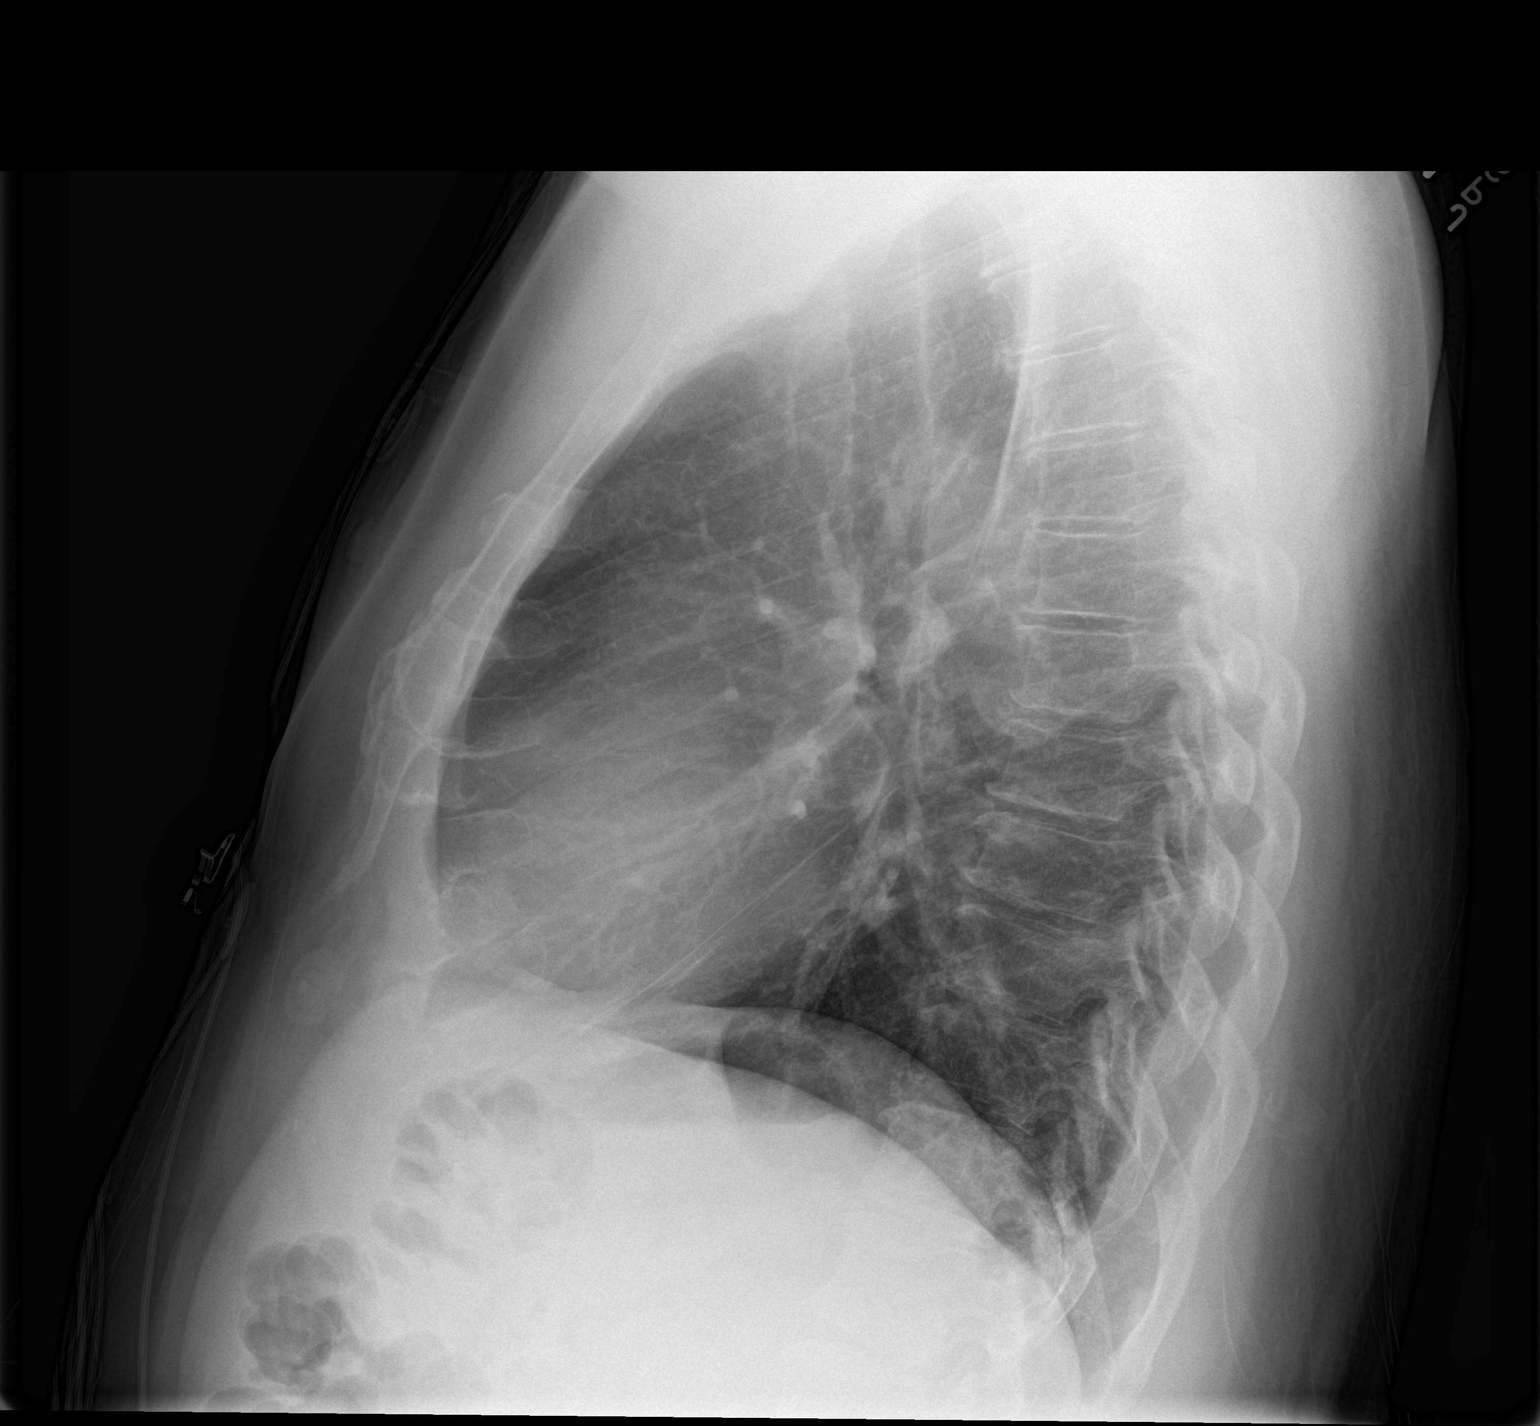

[2 of 2 positions shown; findings below may reference images not displayed]

FINDINGS: The heart size and mediastinal contours are within normal limits.
Both lungs are clear. The visualized skeletal structures are
unremarkable.
IMPRESSION: Negative two view chest x-ray

## 2016-11-12 ENCOUNTER — Encounter: Payer: Self-pay | Admitting: Nurse Practitioner

## 2016-11-12 ENCOUNTER — Ambulatory Visit (INDEPENDENT_AMBULATORY_CARE_PROVIDER_SITE_OTHER): Payer: BLUE CROSS/BLUE SHIELD | Admitting: Nurse Practitioner

## 2016-11-12 VITALS — BP 130/86 | HR 78 | Wt 217.2 lb

## 2016-11-12 DIAGNOSIS — I10 Essential (primary) hypertension: Secondary | ICD-10-CM

## 2016-11-12 DIAGNOSIS — I63519 Cerebral infarction due to unspecified occlusion or stenosis of unspecified middle cerebral artery: Secondary | ICD-10-CM | POA: Diagnosis not present

## 2016-11-12 DIAGNOSIS — R269 Unspecified abnormalities of gait and mobility: Secondary | ICD-10-CM | POA: Diagnosis not present

## 2016-11-12 DIAGNOSIS — E785 Hyperlipidemia, unspecified: Secondary | ICD-10-CM

## 2016-11-12 NOTE — Progress Notes (Signed)
I reviewed above note and agree with the assessment and plan.  Rosalin Hawking, MD PhD Stroke Neurology 11/12/2016 5:14 PM

## 2016-11-12 NOTE — Progress Notes (Signed)
[facility]  PATIENT: Tylar Amborn DOB: Sep 08, 1955   REASON FOR VISIT: Follow-up for cerebrovascular accident, 11/2014, history of obstructive sleep apnea, hyperlipidemia abnormality of gait HISTORY FROM: Patient and wife [name]    HISTORY OF PRESENT ILLNESS:Dr XUMr. Hermenegildo Clausen is a 61 y.o. male with history of HTN was admitted on 11/23/14 for right arm weakness, speech difficulties, and right facial droop. MRI showed left basal ganglia and corona radiata lacunar infarct, likely due to small vessel disease. MRA unremarkable. Carotid Doppler and 2-D echo negative. LDL 145, and A1c 5.2. He was discharged on aspirin 325 and Lipitor 40 with outpatient PT/OT.  UPDATE 13/3/16JXDuring the interval time, the patient has been doing well. No recurrent strokelike symptoms. He has PT/OT twice every week. On valsartan and HCTZ for blood pressure, today 115/71. Wife confirms he has snoring and intermittent apnea during sleep, however better after stroke. He is continued on aspirin and Lipitor without side effect.  UPDATE 05/10/2015 CMMr. Weinrich, 61 year old male returns for follow-up. He has a history of left basal ganglia and lacunar infarct likely due to small vessel disease. He is currently on aspirin 325 and Lipitor 40 mg for secondary stroke prevention. He has not had further stroke or TIA symptoms. He has been diagnosed with obstructive sleep apnea and the equipment company called however he has not received the CPAP equipment His physical therapy compleated about a month ago and he is performing his home exercise program. He returns for reevaluation UPDATE 07/18/2017CM Mr. Funke 60 year old male returns for follow-up. He has a history of left basal ganglia and corona radiata lacunar infarct likely due to small vessel disease on 11/23/2014. He is currently on aspirin and Lipitor for secondary stroke prevention without further stroke or TIA symptoms sleep test was positive for  obstructive sleep apnea however patient has refused CPAP. His labs are followed through his primary care Dr. Inda Merlin. He is continuing to do home exercise program. He returns for reevaluation. He has applied for disability UPDATE 07/24/2018CM Mr. Stary, 61 year old male returns for follow-up with history of left basal ganglia and corona lacunar infarct likely due to small vessel disease. He is currently on aspirin for secondary stroke prevention with no bruising and no bleeding. He is on Lipitor without complaints of myalgias. Blood pressure well controlled 130/86 in the office today. Sleep test is positive for obstructive sleep apnea however patient has refused CPAP. His labs are followed by Dr. Inda Merlin. He continues to exercise by walking. He has not had further stroke or TIA symptoms. He returns for reevaluation REVIEW OF SYSTEMS: Full 14 system review of systems performed and notable only for those listed, all others are neg:  Constitutional: neg  Cardiovascular: neg Ear/Nose/Throat: neg  Skin: neg Eyes: neg Respiratory: neg Gastroitestinal: neg  Hematology/Lymphatic: neg  Endocrine: Intolerance to cold Musculoskeletal: Walking difficulty Allergy/Immunology: neg Neurological: neg Psychiatric: neg Sleep : neg   ALLERGIES: No Known Allergies  HOME MEDICATIONS: Outpatient Medications Prior to Visit  Medication Sig Dispense Refill  . aspirin 325 MG tablet Take 1 tablet (325 mg total) by mouth daily.    Marland Kitchen atorvastatin (LIPITOR) 40 MG tablet Take 1 tablet (40 mg total) by mouth daily at 6 PM. 30 tablet 1  . Multiple Vitamin (MULTIVITAMIN WITH MINERALS) TABS tablet Take 1 tablet by mouth daily.    . Probiotic Product (PROBIOTIC DAILY PO) Take by mouth.    . valsartan-hydrochlorothiazide (DIOVAN-HCT) 160-12.5 MG per tablet Take 1 tablet by mouth daily.  No facility-administered medications prior to visit.     PAST MEDICAL HISTORY: Past Medical History:  Diagnosis Date  . Hypertension    . Stroke [facility])     PAST SURGICAL HISTORY: Past Surgical History:  Procedure Laterality Date  . NO PAST SURGERIES      FAMILY HISTORY: Family History  Problem Relation Age of Onset  . Hypertension Mother   . Pancreatic cancer Mother   . Stroke Maternal Aunt   . Lung cancer Father   . Cancer Paternal Grandmother   . Emphysema Paternal Grandfather     SOCIAL HISTORY: Social History   Social History  . Marital status: Married    Spouse name: N/A  . Number of children: 0  . Years of education: 12   Occupational History  . Not on file.   Social History Main Topics  . Smoking status: Never Smoker  . Smokeless tobacco: Never Used  . Alcohol use No  . Drug use: No  . Sexual activity: Not on file   Other Topics Concern  . Not on file   Social History Narrative   8oz of caffeine beverage a day      PHYSICAL EXAM  Vitals:   11/12/16 1030  BP: 130/86  Pulse: 78  Weight: 217 lb 3.2 oz (98.5 kg)   Body mass index is 32.07 kg/m. Generalized: Well developed, in no acute distress  Head: normocephalic and atraumatic,. Oropharynx benign  Neck: Supple, no carotid bruits  Cardiac: Regular rate rhythm, no murmur  Musculoskeletal: No deformity   Neurological examination   Mentation: Alert oriented to time, place, history taking. Attention span and concentration appropriate. Recent and remote memory intact.  Follows all commands speech and language fluent.   Cranial nerve II-XII: .Pupils were equal round reactive to light extraocular movements were full, visual field were full on confrontational test. Facial sensation and strength were normal. hearing was intact to finger rubbing bilaterally. Uvula tongue midline. head turning and shoulder shrug were normal and symmetric.Tongue protrusion into cheek strength was normal. Motor: normal bulk and tone, full strength in the LUE, LLE, 4 / 5 right upper extremity, 5-/5 RLE decreased grip strength on the right hand versus  left Sensory: normal and symmetric to light touch, pinprick, and  Vibration, in the upper and lower extremities Coordination: finger-nose-finger, heel-to-shin bilaterally, no dysmetria Reflexes: 1+ upper lower and symmetric, plantar responses were flexor bilaterally. Gait and Station: Rising up from seated position without assistance, subtle hemiparetic gait on the right decreased arm swing on the right , no assistive device   . DIAGNOSTIC DATA (LABS, IMAGING, TESTING) - I reviewed patient records, labs, notes, testing and imaging myself where available.  Lab Results  Component Value Date   WBC 6.3 11/24/2014   HGB 12.9 (L) 11/24/2014   HCT 39.1 11/24/2014   MCV 99.7 11/24/2014   PLT 243 11/24/2014      Component Value Date/Time   NA 140 11/24/2014 0450   K 4.2 11/24/2014 0450   CL 106 11/24/2014 0450   CO2 29 11/24/2014 0450   GLUCOSE 104 (H) 11/24/2014 0450   BUN 7 11/24/2014 0450   CREATININE 0.97 11/24/2014 0450   CALCIUM 9.3 11/24/2014 0450   PROT 6.8 11/24/2014 0450   ALBUMIN 3.6 11/24/2014 0450   AST 20 11/24/2014 0450   ALT 17 11/24/2014 0450   ALKPHOS 42 11/24/2014 0450   BILITOT 0.8 11/24/2014 0450   GFRNONAA >60 11/24/2014 0450   GFRAA >60 11/24/2014 0450  Lab Results  Component Value Date   CHOL 218 (H) 11/24/2014   HDL 62 11/24/2014   LDLCALC 145 (H) 11/24/2014   TRIG 57 11/24/2014   CHOLHDL 3.5 11/24/2014   Lab Results  Component Value Date   HGBA1C 5.2 11/24/2014   ASSESSMENT AND PLAN 61 y.o. African American male with PMH of HTN was admitted on 11/23/14 for left basal ganglia and corona radiata lacunar infarct on MRI, likely due to small vessel disease. MRA, Carotid Doppler and 2-D echo negative. LDL 145, and A1c 5.2. He was discharged on aspirin 325 and Lipitor 40 No further stroke or TIA symptoms .Obstructive sleep apnea with CPAP ordered  patient refused CPAP    PLAN: Continue ASA and lipitor for stroke prevention Follow up with your primary  care physician for stroke risk factor modification. Recommend maintain blood pressure goal <130/80,todays reading 130/86 hemoglobin A1c goal below 6.5% followed by PCP lipids with LDL cholesterol goal below 70 mg/dL. Continue Lipitor Check BP at home at least weekly Continue home exercise program will discharge from stroke clinic I spent 25 minutes in total face to face time with the patient more than 50% of which was spent counseling and coordination of care, reviewing test results reviewing medications and discussing and reviewing the diagnosis of stroke and management of risk factors. Written information given as well Dennie Bible, [facility], [facility], APRN  [facility] [address], [place], [place] [address] [phone]

## 2016-11-12 NOTE — Patient Instructions (Addendum)
Continue ASA and lipitor for stroke prevention Follow up with your primary care physician for stroke risk factor modification. Recommend maintain blood pressure goal <130/80,todays reading 130/86 hemoglobin A1c goal below 6.5% followed by PCP lipids with LDL cholesterol goal below 70 mg/dL. Continue Lipitor Check BP at home at least weekly Continue home exercise program will discharge from stroke clinic Stroke Prevention Some medical conditions and behaviors are associated with an increased chance of having a stroke. You may prevent a stroke by making healthy choices and managing medical conditions. How can I reduce my risk of having a stroke?  Stay physically active. Get at least 30 minutes of activity on most or all days.  Do not smoke. It may also be helpful to avoid exposure to secondhand smoke.  Limit alcohol use. Moderate alcohol use is considered to be: ? No more than 2 drinks per day for men. ? No more than 1 drink per day for nonpregnant women.  Eat healthy foods. This involves: ? Eating 5 or more servings of fruits and vegetables a day. ? Making dietary changes that address high blood pressure (hypertension), high cholesterol, diabetes, or obesity.  Manage your cholesterol levels. ? Making food choices that are high in fiber and low in saturated fat, trans fat, and cholesterol may control cholesterol levels. ? Take any prescribed medicines to control cholesterol as directed by your health care provider.  Manage your diabetes. ? Controlling your carbohydrate and sugar intake is recommended to manage diabetes. ? Take any prescribed medicines to control diabetes as directed by your health care provider.  Control your hypertension. ? Making food choices that are low in salt (sodium), saturated fat, trans fat, and cholesterol is recommended to manage hypertension. ? Ask your health care provider if you need treatment to lower your blood pressure. Take any prescribed medicines  to control hypertension as directed by your health care provider. ? If you are 76-21 years of age, have your blood pressure checked every 3-5 years. If you are 42 years of age or older, have your blood pressure checked every year.  Maintain a healthy weight. ? Reducing calorie intake and making food choices that are low in sodium, saturated fat, trans fat, and cholesterol are recommended to manage weight.  Stop drug abuse.  Avoid taking birth control pills. ? Talk to your health care provider about the risks of taking birth control pills if you are over 58 years old, smoke, get migraines, or have ever had a blood clot.  Get evaluated for sleep disorders (sleep apnea). ? Talk to your health care provider about getting a sleep evaluation if you snore a lot or have excessive sleepiness.  Take medicines only as directed by your health care provider. ? For some people, aspirin or blood thinners (anticoagulants) are helpful in reducing the risk of forming abnormal blood clots that can lead to stroke. If you have the irregular heart rhythm of atrial fibrillation, you should be on a blood thinner unless there is a good reason you cannot take them. ? Understand all your medicine instructions.  Make sure that other conditions (such as anemia or atherosclerosis) are addressed. Get help right away if:  You have sudden weakness or numbness of the face, arm, or leg, especially on one side of the body.  Your face or eyelid droops to one side.  You have sudden confusion.  You have trouble speaking (aphasia) or understanding.  You have sudden trouble seeing in one or both eyes.  You have  sudden trouble walking.  You have dizziness.  You have a loss of balance or coordination.  You have a sudden, severe headache with no known cause.  You have new chest pain or an irregular heartbeat. Any of these symptoms may represent a serious problem that is an emergency. Do not wait to see if the symptoms  will go away. Get medical help at once. Call your local emergency services (911 in [place] Do not drive yourself to the hospital. This information is not intended to replace advice given to you by your health care provider. Make sure you discuss any questions you have with your health care provider. Document Released: 05/16/2004 Document Revised: 09/14/2015 Document Reviewed: 10/09/2012 Elsevier Interactive Patient Education  2017 [employer].

## 2021-08-10 DIAGNOSIS — E669 Obesity, unspecified: Secondary | ICD-10-CM | POA: Diagnosis not present

## 2021-08-10 DIAGNOSIS — E559 Vitamin D deficiency, unspecified: Secondary | ICD-10-CM | POA: Diagnosis not present

## 2021-08-10 DIAGNOSIS — Z23 Encounter for immunization: Secondary | ICD-10-CM | POA: Diagnosis not present

## 2021-08-10 DIAGNOSIS — I10 Essential (primary) hypertension: Secondary | ICD-10-CM | POA: Diagnosis not present

## 2021-08-10 DIAGNOSIS — G8191 Hemiplegia, unspecified affecting right dominant side: Secondary | ICD-10-CM | POA: Diagnosis not present

## 2021-08-10 DIAGNOSIS — D649 Anemia, unspecified: Secondary | ICD-10-CM | POA: Diagnosis not present

## 2021-08-10 DIAGNOSIS — I6930 Unspecified sequelae of cerebral infarction: Secondary | ICD-10-CM | POA: Diagnosis not present

## 2021-08-10 DIAGNOSIS — Z79899 Other long term (current) drug therapy: Secondary | ICD-10-CM | POA: Diagnosis not present

## 2021-08-10 DIAGNOSIS — Z125 Encounter for screening for malignant neoplasm of prostate: Secondary | ICD-10-CM | POA: Diagnosis not present

## 2021-08-10 DIAGNOSIS — Z0000 Encounter for general adult medical examination without abnormal findings: Secondary | ICD-10-CM | POA: Diagnosis not present

## 2021-08-15 DIAGNOSIS — I10 Essential (primary) hypertension: Secondary | ICD-10-CM | POA: Diagnosis not present

## 2021-09-19 DIAGNOSIS — I10 Essential (primary) hypertension: Secondary | ICD-10-CM | POA: Diagnosis not present

## 2021-11-20 DIAGNOSIS — G8191 Hemiplegia, unspecified affecting right dominant side: Secondary | ICD-10-CM | POA: Diagnosis not present

## 2021-11-20 DIAGNOSIS — I10 Essential (primary) hypertension: Secondary | ICD-10-CM | POA: Diagnosis not present

## 2021-11-20 DIAGNOSIS — I6930 Unspecified sequelae of cerebral infarction: Secondary | ICD-10-CM | POA: Diagnosis not present

## 2022-02-21 DIAGNOSIS — H35362 Drusen (degenerative) of macula, left eye: Secondary | ICD-10-CM | POA: Diagnosis not present

## 2022-03-21 DIAGNOSIS — D122 Benign neoplasm of ascending colon: Secondary | ICD-10-CM | POA: Diagnosis not present

## 2022-03-21 DIAGNOSIS — Z9889 Other specified postprocedural states: Secondary | ICD-10-CM | POA: Diagnosis not present

## 2022-03-21 DIAGNOSIS — Z86010 Personal history of colonic polyps: Secondary | ICD-10-CM | POA: Diagnosis not present

## 2022-03-21 DIAGNOSIS — K644 Residual hemorrhoidal skin tags: Secondary | ICD-10-CM | POA: Diagnosis not present

## 2022-03-21 DIAGNOSIS — K648 Other hemorrhoids: Secondary | ICD-10-CM | POA: Diagnosis not present

## 2022-03-21 DIAGNOSIS — Z09 Encounter for follow-up examination after completed treatment for conditions other than malignant neoplasm: Secondary | ICD-10-CM | POA: Diagnosis not present

## 2022-03-22 DIAGNOSIS — I6930 Unspecified sequelae of cerebral infarction: Secondary | ICD-10-CM | POA: Diagnosis not present

## 2022-03-22 DIAGNOSIS — D649 Anemia, unspecified: Secondary | ICD-10-CM | POA: Diagnosis not present

## 2022-03-22 DIAGNOSIS — Z23 Encounter for immunization: Secondary | ICD-10-CM | POA: Diagnosis not present

## 2022-03-22 DIAGNOSIS — G8191 Hemiplegia, unspecified affecting right dominant side: Secondary | ICD-10-CM | POA: Diagnosis not present

## 2022-03-22 DIAGNOSIS — I10 Essential (primary) hypertension: Secondary | ICD-10-CM | POA: Diagnosis not present

## 2022-03-22 DIAGNOSIS — E559 Vitamin D deficiency, unspecified: Secondary | ICD-10-CM | POA: Diagnosis not present

## 2022-03-22 DIAGNOSIS — E669 Obesity, unspecified: Secondary | ICD-10-CM | POA: Diagnosis not present

## 2022-03-22 DIAGNOSIS — Z79899 Other long term (current) drug therapy: Secondary | ICD-10-CM | POA: Diagnosis not present

## 2022-03-25 DIAGNOSIS — D122 Benign neoplasm of ascending colon: Secondary | ICD-10-CM | POA: Diagnosis not present

## 2022-04-04 DIAGNOSIS — I10 Essential (primary) hypertension: Secondary | ICD-10-CM | POA: Diagnosis not present

## 2022-04-19 DIAGNOSIS — H5213 Myopia, bilateral: Secondary | ICD-10-CM | POA: Diagnosis not present

## 2022-04-19 DIAGNOSIS — H52209 Unspecified astigmatism, unspecified eye: Secondary | ICD-10-CM | POA: Diagnosis not present

## 2022-05-16 DIAGNOSIS — I10 Essential (primary) hypertension: Secondary | ICD-10-CM | POA: Diagnosis not present

## 2022-05-16 DIAGNOSIS — G8191 Hemiplegia, unspecified affecting right dominant side: Secondary | ICD-10-CM | POA: Diagnosis not present

## 2022-05-24 DIAGNOSIS — I10 Essential (primary) hypertension: Secondary | ICD-10-CM | POA: Diagnosis not present

## 2022-05-24 DIAGNOSIS — N400 Enlarged prostate without lower urinary tract symptoms: Secondary | ICD-10-CM | POA: Diagnosis not present

## 2022-05-29 DIAGNOSIS — I10 Essential (primary) hypertension: Secondary | ICD-10-CM | POA: Diagnosis not present

## 2022-07-16 DIAGNOSIS — I10 Essential (primary) hypertension: Secondary | ICD-10-CM | POA: Diagnosis not present

## 2022-07-16 DIAGNOSIS — E669 Obesity, unspecified: Secondary | ICD-10-CM | POA: Diagnosis not present

## 2022-07-16 DIAGNOSIS — N189 Chronic kidney disease, unspecified: Secondary | ICD-10-CM | POA: Diagnosis not present

## 2022-07-16 DIAGNOSIS — N182 Chronic kidney disease, stage 2 (mild): Secondary | ICD-10-CM | POA: Diagnosis not present

## 2022-07-16 DIAGNOSIS — D649 Anemia, unspecified: Secondary | ICD-10-CM | POA: Diagnosis not present

## 2022-07-16 DIAGNOSIS — I639 Cerebral infarction, unspecified: Secondary | ICD-10-CM | POA: Diagnosis not present

## 2022-07-16 DIAGNOSIS — I129 Hypertensive chronic kidney disease with stage 1 through stage 4 chronic kidney disease, or unspecified chronic kidney disease: Secondary | ICD-10-CM | POA: Diagnosis not present

## 2022-07-18 ENCOUNTER — Other Ambulatory Visit: Payer: Self-pay | Admitting: Nephrology

## 2022-07-18 DIAGNOSIS — D649 Anemia, unspecified: Secondary | ICD-10-CM

## 2022-07-18 DIAGNOSIS — E669 Obesity, unspecified: Secondary | ICD-10-CM

## 2022-07-18 DIAGNOSIS — I639 Cerebral infarction, unspecified: Secondary | ICD-10-CM

## 2022-07-18 DIAGNOSIS — I10 Essential (primary) hypertension: Secondary | ICD-10-CM

## 2022-07-18 DIAGNOSIS — N182 Chronic kidney disease, stage 2 (mild): Secondary | ICD-10-CM

## 2022-08-26 ENCOUNTER — Ambulatory Visit
Admission: RE | Admit: 2022-08-26 | Discharge: 2022-08-26 | Disposition: A | Discharge status: Home / Self Care | Payer: Medicare HMO | Source: Ambulatory Visit | Attending: Nephrology | Admitting: Nephrology

## 2022-08-26 DIAGNOSIS — I639 Cerebral infarction, unspecified: Secondary | ICD-10-CM

## 2022-08-26 DIAGNOSIS — N182 Chronic kidney disease, stage 2 (mild): Secondary | ICD-10-CM

## 2022-08-26 DIAGNOSIS — I10 Essential (primary) hypertension: Secondary | ICD-10-CM

## 2022-08-26 DIAGNOSIS — D649 Anemia, unspecified: Secondary | ICD-10-CM

## 2022-08-26 DIAGNOSIS — N281 Cyst of kidney, acquired: Secondary | ICD-10-CM | POA: Diagnosis not present

## 2022-08-26 DIAGNOSIS — E669 Obesity, unspecified: Secondary | ICD-10-CM

## 2022-10-21 DIAGNOSIS — I6930 Unspecified sequelae of cerebral infarction: Secondary | ICD-10-CM | POA: Diagnosis not present

## 2022-10-21 DIAGNOSIS — Z1211 Encounter for screening for malignant neoplasm of colon: Secondary | ICD-10-CM | POA: Diagnosis not present

## 2022-10-21 DIAGNOSIS — E559 Vitamin D deficiency, unspecified: Secondary | ICD-10-CM | POA: Diagnosis not present

## 2022-10-21 DIAGNOSIS — G479 Sleep disorder, unspecified: Secondary | ICD-10-CM | POA: Diagnosis not present

## 2022-10-21 DIAGNOSIS — I10 Essential (primary) hypertension: Secondary | ICD-10-CM | POA: Diagnosis not present

## 2022-10-21 DIAGNOSIS — Z0000 Encounter for general adult medical examination without abnormal findings: Secondary | ICD-10-CM | POA: Diagnosis not present

## 2022-10-21 DIAGNOSIS — E669 Obesity, unspecified: Secondary | ICD-10-CM | POA: Diagnosis not present

## 2022-10-21 DIAGNOSIS — I69351 Hemiplegia and hemiparesis following cerebral infarction affecting right dominant side: Secondary | ICD-10-CM | POA: Diagnosis not present

## 2022-10-21 DIAGNOSIS — Z125 Encounter for screening for malignant neoplasm of prostate: Secondary | ICD-10-CM | POA: Diagnosis not present

## 2022-10-21 DIAGNOSIS — Z1331 Encounter for screening for depression: Secondary | ICD-10-CM | POA: Diagnosis not present

## 2022-10-21 DIAGNOSIS — Z79899 Other long term (current) drug therapy: Secondary | ICD-10-CM | POA: Diagnosis not present

## 2022-10-21 DIAGNOSIS — D649 Anemia, unspecified: Secondary | ICD-10-CM | POA: Diagnosis not present

## 2022-11-26 DIAGNOSIS — G4719 Other hypersomnia: Secondary | ICD-10-CM | POA: Diagnosis not present

## 2022-11-26 DIAGNOSIS — I10 Essential (primary) hypertension: Secondary | ICD-10-CM | POA: Diagnosis not present

## 2022-11-27 DIAGNOSIS — N182 Chronic kidney disease, stage 2 (mild): Secondary | ICD-10-CM | POA: Diagnosis not present

## 2022-11-27 DIAGNOSIS — I10 Essential (primary) hypertension: Secondary | ICD-10-CM | POA: Diagnosis not present

## 2022-11-27 DIAGNOSIS — D649 Anemia, unspecified: Secondary | ICD-10-CM | POA: Diagnosis not present

## 2022-11-27 DIAGNOSIS — E669 Obesity, unspecified: Secondary | ICD-10-CM | POA: Diagnosis not present

## 2022-11-27 DIAGNOSIS — I129 Hypertensive chronic kidney disease with stage 1 through stage 4 chronic kidney disease, or unspecified chronic kidney disease: Secondary | ICD-10-CM | POA: Diagnosis not present

## 2022-11-27 DIAGNOSIS — I639 Cerebral infarction, unspecified: Secondary | ICD-10-CM | POA: Diagnosis not present

## 2023-01-15 DIAGNOSIS — D649 Anemia, unspecified: Secondary | ICD-10-CM | POA: Diagnosis not present

## 2023-03-03 DIAGNOSIS — H35362 Drusen (degenerative) of macula, left eye: Secondary | ICD-10-CM | POA: Diagnosis not present

## 2023-04-01 DIAGNOSIS — E669 Obesity, unspecified: Secondary | ICD-10-CM | POA: Diagnosis not present

## 2023-04-01 DIAGNOSIS — N182 Chronic kidney disease, stage 2 (mild): Secondary | ICD-10-CM | POA: Diagnosis not present

## 2023-04-01 DIAGNOSIS — D649 Anemia, unspecified: Secondary | ICD-10-CM | POA: Diagnosis not present

## 2023-04-01 DIAGNOSIS — I639 Cerebral infarction, unspecified: Secondary | ICD-10-CM | POA: Diagnosis not present

## 2023-04-01 DIAGNOSIS — I129 Hypertensive chronic kidney disease with stage 1 through stage 4 chronic kidney disease, or unspecified chronic kidney disease: Secondary | ICD-10-CM | POA: Diagnosis not present

## 2023-04-01 DIAGNOSIS — I10 Essential (primary) hypertension: Secondary | ICD-10-CM | POA: Diagnosis not present

## 2023-04-28 DIAGNOSIS — G8191 Hemiplegia, unspecified affecting right dominant side: Secondary | ICD-10-CM | POA: Diagnosis not present

## 2023-04-28 DIAGNOSIS — I10 Essential (primary) hypertension: Secondary | ICD-10-CM | POA: Diagnosis not present

## 2023-08-07 DIAGNOSIS — K080 Exfoliation of teeth due to systemic causes: Secondary | ICD-10-CM | POA: Diagnosis not present

## 2023-10-28 DIAGNOSIS — D649 Anemia, unspecified: Secondary | ICD-10-CM | POA: Diagnosis not present

## 2023-10-28 DIAGNOSIS — I10 Essential (primary) hypertension: Secondary | ICD-10-CM | POA: Diagnosis not present

## 2023-10-28 DIAGNOSIS — I639 Cerebral infarction, unspecified: Secondary | ICD-10-CM | POA: Diagnosis not present

## 2023-10-28 DIAGNOSIS — I129 Hypertensive chronic kidney disease with stage 1 through stage 4 chronic kidney disease, or unspecified chronic kidney disease: Secondary | ICD-10-CM | POA: Diagnosis not present

## 2023-10-28 DIAGNOSIS — N182 Chronic kidney disease, stage 2 (mild): Secondary | ICD-10-CM | POA: Diagnosis not present

## 2023-12-01 DIAGNOSIS — G479 Sleep disorder, unspecified: Secondary | ICD-10-CM | POA: Diagnosis not present

## 2023-12-01 DIAGNOSIS — Z0000 Encounter for general adult medical examination without abnormal findings: Secondary | ICD-10-CM | POA: Diagnosis not present

## 2023-12-01 DIAGNOSIS — Z1331 Encounter for screening for depression: Secondary | ICD-10-CM | POA: Diagnosis not present

## 2023-12-01 DIAGNOSIS — Z125 Encounter for screening for malignant neoplasm of prostate: Secondary | ICD-10-CM | POA: Diagnosis not present

## 2023-12-01 DIAGNOSIS — I10 Essential (primary) hypertension: Secondary | ICD-10-CM | POA: Diagnosis not present

## 2023-12-01 DIAGNOSIS — R600 Localized edema: Secondary | ICD-10-CM | POA: Diagnosis not present

## 2023-12-01 DIAGNOSIS — Z79899 Other long term (current) drug therapy: Secondary | ICD-10-CM | POA: Diagnosis not present

## 2023-12-01 DIAGNOSIS — E559 Vitamin D deficiency, unspecified: Secondary | ICD-10-CM | POA: Diagnosis not present
